# Patient Record
Sex: Female | Born: 1995 | Race: Black or African American | Hispanic: No | Marital: Married | Smoking: Never smoker
Health system: Southern US, Community
[De-identification: ages and names within clinical notes are randomized; demographics above are authoritative.]

## PROBLEM LIST (undated history)

## (undated) ENCOUNTER — Inpatient Hospital Stay (HOSPITAL_COMMUNITY): Payer: Self-pay

## (undated) ENCOUNTER — Inpatient Hospital Stay (HOSPITAL_COMMUNITY): Payer: Medicaid Other

## (undated) DIAGNOSIS — J45909 Unspecified asthma, uncomplicated: Secondary | ICD-10-CM

## (undated) DIAGNOSIS — O139 Gestational [pregnancy-induced] hypertension without significant proteinuria, unspecified trimester: Secondary | ICD-10-CM

## (undated) DIAGNOSIS — O09299 Supervision of pregnancy with other poor reproductive or obstetric history, unspecified trimester: Secondary | ICD-10-CM

## (undated) DIAGNOSIS — Z87828 Personal history of other (healed) physical injury and trauma: Secondary | ICD-10-CM

## (undated) DIAGNOSIS — D759 Disease of blood and blood-forming organs, unspecified: Secondary | ICD-10-CM

## (undated) DIAGNOSIS — O1495 Unspecified pre-eclampsia, complicating the puerperium: Secondary | ICD-10-CM

## (undated) DIAGNOSIS — D573 Sickle-cell trait: Secondary | ICD-10-CM

## (undated) DIAGNOSIS — D649 Anemia, unspecified: Secondary | ICD-10-CM

## (undated) HISTORY — DX: Supervision of pregnancy with other poor reproductive or obstetric history, unspecified trimester: O09.299

## (undated) HISTORY — DX: Personal history of other (healed) physical injury and trauma: Z87.828

## (undated) HISTORY — PX: NO PAST SURGERIES: SHX2092

## (undated) HISTORY — DX: Unspecified pre-eclampsia, complicating the puerperium: O14.95

---
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
## 2016-07-03 NOTE — L&D Delivery Note (Signed)
Patient is a 21 y.o. now G1P1 s/p NSVD at 5450w3d, who was admitted for IOL due to gHTN  Delivery Note At 1:42 PM a viable female was delivered via Vaginal, Spontaneous Delivery (Presentation:cephalic ;  OA).  APGAR: 9, 9; weight 6 lb 3 oz (2807 g).   Placenta status:intact , .  Cord: 3 vessels  with the following complications: none.    Anesthesia: epidural  Episiotomy: None Lacerations: None Suture Repair: none Est. Blood Loss (mL): 100  Mom to postpartum.  Baby to Couplet care / Skin to Skin.   Head delivered OA. No nuchal cord present. Shoulder and body delivered in usual fashion. Infant with spontaneous cry, placed on mother's abdomen, dried and bulb suctioned. Cord clamped x 2 after 1-minute delay, and cut by family member. Cord blood drawn. Placenta delivered spontaneously with gentle cord traction. Fundus firm with massage and Pitocin.   Teodoro KilKerrriann S. Carvell Hoeffner,  MD Family Medicine Resident PGY-1 05/04/17, 3:46 PM

## 2016-10-10 ENCOUNTER — Inpatient Hospital Stay (HOSPITAL_COMMUNITY)
Admission: AD | Admit: 2016-10-10 | Discharge: 2016-10-10 | Disposition: A | Discharge status: Home / Self Care | Payer: Self-pay | Source: Ambulatory Visit | Attending: Family Medicine | Admitting: Family Medicine

## 2016-10-10 ENCOUNTER — Encounter (HOSPITAL_COMMUNITY): Payer: Self-pay | Admitting: *Deleted

## 2016-10-10 ENCOUNTER — Inpatient Hospital Stay (HOSPITAL_COMMUNITY): Payer: Self-pay

## 2016-10-10 DIAGNOSIS — O209 Hemorrhage in early pregnancy, unspecified: Secondary | ICD-10-CM | POA: Insufficient documentation

## 2016-10-10 DIAGNOSIS — Z3A09 9 weeks gestation of pregnancy: Secondary | ICD-10-CM | POA: Insufficient documentation

## 2016-10-10 DIAGNOSIS — O3481 Maternal care for other abnormalities of pelvic organs, first trimester: Secondary | ICD-10-CM

## 2016-10-10 DIAGNOSIS — O3482 Maternal care for other abnormalities of pelvic organs, second trimester: Secondary | ICD-10-CM | POA: Insufficient documentation

## 2016-10-10 DIAGNOSIS — Z79899 Other long term (current) drug therapy: Secondary | ICD-10-CM | POA: Insufficient documentation

## 2016-10-10 DIAGNOSIS — N83292 Other ovarian cyst, left side: Secondary | ICD-10-CM | POA: Insufficient documentation

## 2016-10-10 DIAGNOSIS — N83209 Unspecified ovarian cyst, unspecified side: Secondary | ICD-10-CM

## 2016-10-10 DIAGNOSIS — Z3491 Encounter for supervision of normal pregnancy, unspecified, first trimester: Secondary | ICD-10-CM

## 2016-10-10 HISTORY — DX: Unspecified asthma, uncomplicated: J45.909

## 2016-10-10 LAB — WET PREP, GENITAL
SPERM: NONE SEEN
TRICH WET PREP: NONE SEEN
Yeast Wet Prep HPF POC: NONE SEEN

## 2016-10-10 LAB — URINALYSIS, ROUTINE W REFLEX MICROSCOPIC
BILIRUBIN URINE: NEGATIVE
GLUCOSE, UA: NEGATIVE mg/dL
KETONES UR: NEGATIVE mg/dL
LEUKOCYTES UA: NEGATIVE
NITRITE: NEGATIVE
PH: 9 — AB (ref 5.0–8.0)
Protein, ur: NEGATIVE mg/dL
Specific Gravity, Urine: 1.016 (ref 1.005–1.030)

## 2016-10-10 LAB — CBC
HEMATOCRIT: 32.7 % — AB (ref 36.0–46.0)
Hemoglobin: 11.1 g/dL — ABNORMAL LOW (ref 12.0–15.0)
MCH: 29.5 pg (ref 26.0–34.0)
MCHC: 33.9 g/dL (ref 30.0–36.0)
MCV: 87 fL (ref 78.0–100.0)
Platelets: 336 10*3/uL (ref 150–400)
RBC: 3.76 MIL/uL — ABNORMAL LOW (ref 3.87–5.11)
RDW: 13.8 % (ref 11.5–15.5)
WBC: 11.5 10*3/uL — AB (ref 4.0–10.5)

## 2016-10-10 LAB — HCG, QUANTITATIVE, PREGNANCY: hCG, Beta Chain, Quant, S: 238775 m[IU]/mL — ABNORMAL HIGH (ref <5)

## 2016-10-10 LAB — POCT PREGNANCY, URINE: Preg Test, Ur: POSITIVE — AB

## 2016-10-10 LAB — ABO/RH: ABO/RH(D): O POS

## 2016-10-10 NOTE — MAU Provider Note (Signed)
Chief Complaint: No chief complaint on file.   First Provider Initiated Contact with Patient 10/10/16 1115      SUBJECTIVE HPI: Nicole Mcmahon is a 21 y.o. G1P0 at [redacted]w[redacted]d by sure LMP who presents to maternity admissions reporting onset of dark red vaginal bleeding when wiping this morning.  The bleeding has been intermittent, not seen every time she has gone to the bathroom today, and is unchanged since onset. She has not tried any treatments. There are no associated symptoms. She denies vaginal itching/burning, urinary symptoms, h/a, dizziness, n/v, or fever/chills.     HPI  Past Medical History:  Diagnosis Date  . Asthma    History reviewed. No pertinent surgical history. Social History   Social History  . Marital status: Single    Spouse name: N/A  . Number of children: N/A  . Years of education: N/A   Occupational History  . Not on file.   Social History Main Topics  . Smoking status: Never Smoker  . Smokeless tobacco: Never Used  . Alcohol use No  . Drug use: No  . Sexual activity: No   Other Topics Concern  . Not on file   Social History Narrative  . No narrative on file   No current facility-administered medications on file prior to encounter.    Current Outpatient Prescriptions on File Prior to Encounter  Medication Sig Dispense Refill  . polyethylene glycol (MIRALAX / GLYCOLAX) packet Take 17 g by mouth daily. 72 each 1   No Known Allergies  ROS:  Review of Systems  Constitutional: Negative for chills, fatigue and fever.  Respiratory: Negative for shortness of breath.   Cardiovascular: Negative for chest pain.  Gastrointestinal: Negative for nausea and vomiting.  Genitourinary: Positive for vaginal bleeding. Negative for difficulty urinating, dysuria, flank pain, pelvic pain, vaginal discharge and vaginal pain.  Neurological: Negative for dizziness and headaches.  Psychiatric/Behavioral: Negative.      I have reviewed patient's Past Medical Hx,  Surgical Hx, Family Hx, Social Hx, medications and allergies.   Physical Exam  Patient Vitals for the past 24 hrs:  BP Temp Temp src Pulse Resp Height Weight  10/10/16 1031 (!) 121/59 98.5 F (36.9 C) Oral 75 16  (1.549 m) 134 lb 1.9 oz (60.8 kg)   Constitutional: Well-developed, well-nourished female in no acute distress.  Cardiovascular: normal rate Respiratory: normal effort GI: Abd soft, non-tender. Pos BS x 4 MS: Extremities nontender, no edema, normal ROM Neurologic: Alert and oriented x 4.  GU: Neg CVAT.  PELVIC EXAM: Cervix pink, visually closed, without lesion, small amount dark red bleeding, vaginal walls and external genitalia normal Bimanual exam: Cervix 0/long/high, firm, anterior, neg CMT, uterus nontender, ~ 9 week size, adnexa without tenderness, enlargement, or mass   LAB RESULTS Results for orders placed or performed during the hospital encounter of 10/10/16 (from the past 24 hour(s))  Urinalysis, Routine w reflex microscopic     Status: Abnormal   Collection Time: 10/10/16 10:26 AM  Result Value Ref Range   Color, Urine YELLOW YELLOW   APPearance CLEAR CLEAR   Specific Gravity, Urine 1.016 1.005 - 1.030   pH 9.0 (H) 5.0 - 8.0   Glucose, UA NEGATIVE NEGATIVE mg/dL   Hgb urine dipstick SMALL (A) NEGATIVE   Bilirubin Urine NEGATIVE NEGATIVE   Ketones, ur NEGATIVE NEGATIVE mg/dL   Protein, ur NEGATIVE NEGATIVE mg/dL   Nitrite NEGATIVE NEGATIVE   Leukocytes, UA NEGATIVE NEGATIVE   RBC / HPF 0-5 0 -  5 RBC/hpf   WBC, UA 0-5 0 - 5 WBC/hpf   Bacteria, UA RARE (A) NONE SEEN   Squamous Epithelial / LPF 0-5 (A) NONE SEEN   Mucous PRESENT   Pregnancy, urine POC     Status: Abnormal   Collection Time: 10/10/16 10:58 AM  Result Value Ref Range   Preg Test, Ur POSITIVE (A) NEGATIVE  CBC     Status: Abnormal   Collection Time: 10/10/16 11:15 AM  Result Value Ref Range   WBC 11.5 (H) 4.0 - 10.5 K/uL   RBC 3.76 (L) 3.87 - 5.11 MIL/uL   Hemoglobin 11.1 (L) 12.0 -  15.0 g/dL   HCT 16.1 (L) 09.6 - 04.5 %   MCV 87.0 78.0 - 100.0 fL   MCH 29.5 26.0 - 34.0 pg   MCHC 33.9 30.0 - 36.0 g/dL   RDW 40.9 81.1 - 91.4 %   Platelets 336 150 - 400 K/uL  hCG, quantitative, pregnancy     Status: Abnormal   Collection Time: 10/10/16 11:15 AM  Result Value Ref Range   hCG, Beta Chain, Sharene Butters, S 782,956 (H) <5 mIU/mL  ABO/Rh     Status: None   Collection Time: 10/10/16 11:15 AM  Result Value Ref Range   ABO/RH(D) O POS   Wet prep, genital     Status: Abnormal   Collection Time: 10/10/16 11:20 AM  Result Value Ref Range   Yeast Wet Prep HPF POC NONE SEEN NONE SEEN   Trich, Wet Prep NONE SEEN NONE SEEN   Clue Cells Wet Prep HPF POC PRESENT (A) NONE SEEN   WBC, Wet Prep HPF POC MODERATE (A) NONE SEEN   Sperm NONE SEEN     --/--/O POS (04/10 1115)  IMAGING US Ob Comp Less 14 Wks  Result Date: 10/10/2016 CLINICAL DATA:  Lower abdominal cramping, pregnant EXAM: OBSTETRIC <14 WK [place] AND TRANSVAGINAL OB US TECHNIQUE: Both transabdominal and transvaginal ultrasound examinations were performed for complete evaluation of the gestation as well as the maternal uterus, adnexal regions, and pelvic cul-de-sac. Transvaginal technique was performed to assess early pregnancy. COMPARISON:  None. FINDINGS: Intrauterine gestational sac: Single Yolk sac:  Visualized. Embryo:  Visualized. Cardiac Activity: Visualized. Heart Rate: 168  bpm CRL:  22.5  mm   8 w   6 d                  [place] EDC: 05/16/2017 Subchorionic hemorrhage:  None visualized. Maternal uterus/adnexae: Right ovaries within normal limits. 2.8 x 3.2 x 3.0 cm complex left ovarian cyst. Small volume pelvic ascites, likely physiologic. IMPRESSION: Single live intrauterine gestation, with estimated gestational age [redacted] weeks 6 days by crown-rump length. Electronically Signed   By: Charline Bills M.D.   On: 10/10/2016 12:18   US Ob Transvaginal  Result Date: 10/10/2016 CLINICAL DATA:  Lower abdominal cramping, pregnant EXAM:  OBSTETRIC <14 WK [place] AND TRANSVAGINAL OB US TECHNIQUE: Both transabdominal and transvaginal ultrasound examinations were performed for complete evaluation of the gestation as well as the maternal uterus, adnexal regions, and pelvic cul-de-sac. Transvaginal technique was performed to assess early pregnancy. COMPARISON:  None. FINDINGS: Intrauterine gestational sac: Single Yolk sac:  Visualized. Embryo:  Visualized. Cardiac Activity: Visualized. Heart Rate: 168  bpm CRL:  22.5  mm   8 w   6 d                  [place] EDC: 05/16/2017 Subchorionic hemorrhage:  None visualized. Maternal uterus/adnexae: Right ovaries within  normal limits. 2.8 x 3.2 x 3.0 cm complex left ovarian cyst. Small volume pelvic ascites, likely physiologic. IMPRESSION: Single live intrauterine gestation, with estimated gestational age [redacted] weeks 6 days by crown-rump length. Electronically Signed   By: Charline Bills M.D.   On: 10/10/2016 12:18    MAU Management/MDM: Ordered CBC, quant hcg, U/A, wet prep, GC and [place] and reviewed results.  US shows viable IUP with no subchorionic hemorrhage.  There is a complex left ovarian cyst but pt denies pain so this may be incidental.  Reviewed bleeding precautions with pt, recommend pelvic rest while bleeding.  No discharge, odor, or pain so will not treat clue cells with no other symptoms.  [facility] to f[facility] at [facility] for [facility], message sent to establish care. Pt stable at time of discharge.  ASSESSMENT 1. Normal IUP (intrauterine pregnancy) on prenatal ultrasound, first trimester   2. Vaginal bleeding in pregnancy, first trimester   3. Complex cyst of left ovary     PLAN Discharge home with bleeding precautions  Allergies as of 10/10/2016   No Known Allergies     Medication List    TAKE these medications   polyethylene glycol packet Commonly known as:  MIRALAX / GLYCOLAX Take 17 g by mouth daily.      Follow-up Information    [facility] Follow up.    Specialty:  Obstetrics and Gynecology Why:  The office will call you with an appointment, return to MAU as needed for emergencies. Contact information: [address] [place] [address] [phone]          Sharen Counter Certified Nurse-Midwife 10/10/2016  1:16 PM

## 2016-10-10 NOTE — MAU Note (Signed)
Patient c/o vaginal bleeding that she noted started this am; not currently needing to wear a pad. Notes it was red in color; no clots. Endorses lower abdominal cramping that started on her way over to [place]. Rating pain a 4 currently. LMP 08/08/16. Positive pregnancy test at a local clinic per patient.

## 2016-10-11 LAB — GC/CHLAMYDIA PROBE AMP (~~LOC~~) NOT AT ARMC
Chlamydia: NEGATIVE
Neisseria Gonorrhea: NEGATIVE

## 2016-10-11 LAB — HIV ANTIBODY (ROUTINE TESTING W REFLEX): HIV Screen 4th Generation wRfx: NONREACTIVE

## 2016-10-31 ENCOUNTER — Encounter: Payer: Self-pay | Admitting: Obstetrics & Gynecology

## 2017-01-17 ENCOUNTER — Encounter: Payer: Self-pay | Admitting: Radiology

## 2017-01-17 ENCOUNTER — Encounter: Payer: Self-pay | Admitting: Advanced Practice Midwife

## 2017-01-17 ENCOUNTER — Ambulatory Visit (INDEPENDENT_AMBULATORY_CARE_PROVIDER_SITE_OTHER): Payer: Medicaid Other | Admitting: Advanced Practice Midwife

## 2017-01-17 ENCOUNTER — Other Ambulatory Visit (HOSPITAL_COMMUNITY)
Admission: RE | Admit: 2017-01-17 | Discharge: 2017-01-17 | Disposition: A | Discharge status: Home / Self Care | Payer: Medicaid Other | Source: Ambulatory Visit | Attending: Advanced Practice Midwife | Admitting: Advanced Practice Midwife

## 2017-01-17 VITALS — BP 127/72 | HR 102 | Wt 148.6 lb

## 2017-01-17 DIAGNOSIS — Z3A23 23 weeks gestation of pregnancy: Secondary | ICD-10-CM

## 2017-01-17 DIAGNOSIS — O0932 Supervision of pregnancy with insufficient antenatal care, second trimester: Secondary | ICD-10-CM | POA: Diagnosis not present

## 2017-01-17 DIAGNOSIS — Z363 Encounter for antenatal screening for malformations: Secondary | ICD-10-CM | POA: Insufficient documentation

## 2017-01-17 DIAGNOSIS — Z3400 Encounter for supervision of normal first pregnancy, unspecified trimester: Secondary | ICD-10-CM

## 2017-01-17 DIAGNOSIS — Z3402 Encounter for supervision of normal first pregnancy, second trimester: Secondary | ICD-10-CM

## 2017-01-17 DIAGNOSIS — Z3480 Encounter for supervision of other normal pregnancy, unspecified trimester: Secondary | ICD-10-CM | POA: Insufficient documentation

## 2017-01-17 DIAGNOSIS — Z87828 Personal history of other (healed) physical injury and trauma: Secondary | ICD-10-CM

## 2017-01-17 MED ORDER — CITRANATAL 90 DHA 90-1 & 300 MG PO MISC: 1.0000 | Freq: Every day | ORAL | 12 refills | Status: DC
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
## 2017-01-17 NOTE — Patient Instructions (Addendum)
Prenatal Care WHAT IS PRENATAL CARE? Prenatal care is the process of caring for a pregnant woman before she gives birth. Prenatal care makes sure that she and her baby remain as healthy as possible throughout pregnancy. Prenatal care may be provided by a midwife, family practice health care provider, or a childbirth and pregnancy specialist (obstetrician). Prenatal care may include physical examinations, testing, treatments, and education on nutrition, lifestyle, and social support services. WHY IS PRENATAL CARE SO IMPORTANT? Early and consistent prenatal care increases the chance that you and your baby will remain healthy throughout your pregnancy. This type of care also decreases a baby's risk of being born too early (prematurely), or being born smaller than expected (small for gestational age). Any underlying medical conditions you may have that could pose a risk during your pregnancy are discussed during prenatal care visits. You will also be monitored regularly for any new conditions that may arise during your pregnancy so they can be treated quickly and effectively. WHAT HAPPENS DURING PRENATAL CARE VISITS? Prenatal care visits may include the following: Discussion Tell your health care provider about any new signs or symptoms you have experienced since your last visit. These might include:  Nausea or vomiting.  Increased or decreased level of energy.  Difficulty sleeping.  Back or leg pain.  Weight changes.  Frequent urination.  Shortness of breath with physical activity.  Changes in your skin, such as the development of a rash or itchiness.  Vaginal discharge or bleeding.  Feelings of excitement or nervousness.  Changes in your baby's movements.  You may want to write down any questions or topics you want to discuss with your health care provider and bring them with you to your appointment. Examination During your first prenatal care visit, you will likely have a complete  physical exam. Your health care provider will often examine your vagina, cervix, and the position of your uterus, as well as check your heart, lungs, and other body systems. As your pregnancy progresses, your health care provider will measure the size of your uterus and your baby's position inside your uterus. He or she may also examine you for early signs of labor. Your prenatal visits may also include checking your blood pressure and, after about 10-12 weeks of pregnancy, listening to your baby's heartbeat. Testing Regular testing often includes:  Urinalysis. This checks your urine for glucose, protein, or signs of infection.  Blood count. This checks the levels of white and red blood cells in your body.  Tests for sexually transmitted infections (STIs). Testing for STIs at the beginning of pregnancy is routinely done and is required in many states.  Antibody testing. You will be checked to see if you are immune to certain illnesses, such as rubella, that can affect a developing fetus.  Glucose screen. Around 24-28 weeks of pregnancy, your blood glucose level will be checked for signs of gestational diabetes. Follow-up tests may be recommended.  Group B strep. This is a bacteria that is commonly found inside a woman's vagina. This test will inform your health care provider if you need an antibiotic to reduce the amount of this bacteria in your body prior to labor and childbirth.  Ultrasound. Many pregnant women undergo an ultrasound screening around 18-20 weeks of pregnancy to evaluate the health of the fetus and check for any developmental abnormalities.  HIV (human immunodeficiency virus) testing. Early in your pregnancy, you will be screened for HIV. If you are at high risk for HIV, this test may   be repeated during your third trimester of pregnancy.  You may be offered other testing based on your age, personal or family medical history, or other factors. HOW OFTEN SHOULD I PLAN TO SEE MY  HEALTH CARE PROVIDER FOR PRENATAL CARE? Your prenatal care check-up schedule depends on any medical conditions you have before, or develop during, your pregnancy. If you do not have any underlying medical conditions, you will likely be seen for checkups:  Monthly, during the first 6 months of pregnancy.  Twice a month during months 7 and 8 of pregnancy.  Weekly starting in the 9th month of pregnancy and until delivery.  If you develop signs of early labor or other concerning signs or symptoms, you may need to see your health care provider more often. Ask your health care provider what prenatal care schedule is best for you. WHAT CAN I DO TO KEEP MYSELF AND MY BABY AS HEALTHY AS POSSIBLE DURING MY PREGNANCY?  Take a prenatal vitamin containing 400 micrograms (0.4 mg) of folic acid every day. Your health care provider may also ask you to take additional vitamins such as iodine, vitamin D, iron, copper, and zinc.  Take 1500-2000 mg of calcium daily starting at your 20th week of pregnancy until you deliver your baby.  Make sure you are up to date on your vaccinations. Unless directed otherwise by your health care provider: ? You should receive a tetanus, diphtheria, and pertussis (Tdap) vaccination between the 27th and 36th week of your pregnancy, regardless of when your last Tdap immunization occurred. This helps protect your baby from whooping cough (pertussis) after he or she is born. ? You should receive an annual inactivated influenza vaccine (IIV) to help protect you and your baby from influenza. This can be done at any point during your pregnancy.  Eat a well-rounded diet that includes: ? Fresh fruits and vegetables. ? Lean proteins. ? Calcium-rich foods such as milk, yogurt, hard cheeses, and dark, leafy greens. ? Whole grain breads.  Do noteat seafood high in mercury, including: ? Swordfish. ? Tilefish. ? Shark. ? King mackerel. ? More than 6 oz tuna per week.  Do not  eat: ? Raw or undercooked meats or eggs. ? Unpasteurized foods, such as soft cheeses (brie, blue, or feta), juices, and milks. ? Lunch meats. ? Hot dogs that have not been heated until they are steaming.  Drink enough water to keep your urine clear or pale yellow. For many women, this may be 10 or more 8 oz glasses of water each day. Keeping yourself hydrated helps deliver nutrients to your baby and may prevent the start of pre-term uterine contractions.  Do not use any tobacco products including cigarettes, chewing tobacco, or electronic cigarettes. If you need help quitting, ask your health care provider.  Do not drink beverages containing alcohol. No safe level of alcohol consumption during pregnancy has been determined.  Do not use any illegal drugs. These can harm your developing baby or cause a miscarriage.  Ask your health care provider or pharmacist before taking any prescription or over-the-counter medicines, herbs, or supplements.  Limit your caffeine intake to no more than 200 mg per day.  Exercise. Unless told otherwise by your health care provider, try to get 30 minutes of moderate exercise most days of the week. Do not  do high-impact activities, contact sports, or activities with a high risk of falling, such as horseback riding or downhill skiing.  Get plenty of rest.  Avoid anything that raises your  body temperature, such as hot tubs and saunas.  If you own a cat, do not empty its litter box. Bacteria contained in cat feces can cause an infection called toxoplasmosis. This can result in serious harm to the fetus.  Stay away from chemicals such as insecticides, lead, mercury, and cleaning or paint products that contain solvents.  Do not have any X-rays taken unless medically necessary.  Take a childbirth and breastfeeding preparation class. Ask your health care provider if you need a referral or recommendation.  This information is not intended to replace advice given  to you by your health care provider. Make sure you discuss any questions you have with your health care provider. Document Released: 06/22/2003 Document Revised: 11/22/2015 Document Reviewed: 09/03/2013 Elsevier Interactive Patient Education  2017 [employer].   Preterm Labor and Birth Information The normal length of a pregnancy is 39-41 weeks. Preterm labor is when labor starts before 37 completed weeks of pregnancy. What are the risk factors for preterm labor? Preterm labor is more likely to occur in women who:  Have certain infections during pregnancy such as a bladder infection, sexually transmitted infection, or infection inside the uterus (chorioamnionitis).  Have a shorter-than-normal cervix.  Have gone into preterm labor before.  Have had surgery on their cervix.  Are younger than age 44 or older than age 23.  Are African American.  Are pregnant with twins or multiple babies (multiple gestation).  Take street drugs or smoke while pregnant.  Do not gain enough weight while pregnant.  Became pregnant shortly after having been pregnant.  What are the symptoms of preterm labor? Symptoms of preterm labor include:  Cramps similar to those that can happen during a menstrual period. The cramps may happen with diarrhea.  Pain in the abdomen or lower back.  Regular uterine contractions that may feel like tightening of the abdomen.  A feeling of increased pressure in the pelvis.  Increased watery or bloody mucus discharge from the vagina.  Water breaking (ruptured amniotic sac).  Why is it important to recognize signs of preterm labor? It is important to recognize signs of preterm labor because babies who are born prematurely may not be fully developed. This can put them at an increased risk for:  Long-term (chronic) heart and lung problems.  Difficulty immediately after birth with regulating body systems, including blood sugar, body temperature, heart rate, and  breathing rate.  Bleeding in the brain.  Cerebral palsy.  Learning difficulties.  Death.  These risks are highest for babies who are born before 34 weeks of pregnancy. How is preterm labor treated? Treatment depends on the length of your pregnancy, your condition, and the health of your baby. It may involve:  Having a stitch (suture) placed in your cervix to prevent your cervix from opening too early (cerclage).  Taking or being given medicines, such as: ? Hormone medicines. These may be given early in pregnancy to help support the pregnancy. ? Medicine to stop contractions. ? Medicines to help mature the baby's lungs. These may be prescribed if the risk of delivery is high. ? Medicines to prevent your baby from developing cerebral palsy.  If the labor happens before 34 weeks of pregnancy, you may need to stay in the hospital. What should I do if I think I am in preterm labor? If you think that you are going into preterm labor, call your health care provider right away. How can I prevent preterm labor in future pregnancies? To increase your chance  of having a full-term pregnancy:  Do not use any tobacco products, such as cigarettes, chewing tobacco, and e-cigarettes. If you need help quitting, ask your health care provider.  Do not use street drugs or medicines that have not been prescribed to you during your pregnancy.  Talk with your health care provider before taking any herbal supplements, even if you have been taking them regularly.  Make sure you gain a healthy amount of weight during your pregnancy.  Watch for infection. If you think that you might have an infection, get it checked right away.  Make sure to tell your health care provider if you have gone into preterm labor before.  This information is not intended to replace advice given to you by your health care provider. Make sure you discuss any questions you have with your health care provider. Document Released:  09/09/2003 Document Revised: 11/30/2015 Document Reviewed: 11/10/2015 Elsevier Interactive Patient Education  2018 [employer]  AREA PEDIATRIC/FAMILY PRACTICE PHYSICIANS  [facility] [address] [place], [place]  [address] Phone - [phone]   Fax - [phone]  [facility] [address] [place], [place] [address] Phone - [phone]   Fax - [phone]  JACK AMOS [address] [place], [place]  [address] Phone - [phone]   Fax - [phone]  [facility] [address] [place], [place]  [address] Phone - [phone]   Fax - [phone]  [facility] [address] [place], [place]  [address] Phone - [phone]   Fax - [phone]  [facility] [address], [address] [place], [place]  [address] Phone - [phone]   Fax - [phone]  [facility] [address], [address] [place], [place]  [address] Phone - [phone]   Fax - [phone]  [facility] [address] [place], [place]  [address] Phone - [phone]   Fax - [phone]  [facility] [address] [place] [address] [place], [place]  [address] Phone - [phone]   Fax - [phone] [facility] [address] [place], [place]  [address] Phone - [phone]   Fax - [phone]  [facility] [address] [place], [place]  [address] Phone - [phone]   Fax - [phone]  [facility] [address], [address] [place], [place]  [address] Phone - [phone]   Fax - [phone]  [facility] [address] [place], [place]  [address] Phone - [phone]   Fax - [phone]  [facility] [address], [address] [place], [place]  [address] Phone - [phone]  [facility] [address] [place], [place]  [address] Phone - [phone]   Fax - [phone]  [facility] [address], [address] [place], [place]  [address] Phone - [phone]   Fax - [phone]  [facility] [address] [place], [place]  [address] Phone - [phone]   Fax - [phone]  [facility] [address] [place], [place]  [address] Phone - [phone]   Fax - [phone]   [facility] [address] [place], [place]  [address] Phone - [phone]   Fax - [phone]  [facility] [address], [address] [place], [place]  [address] Phone - [phone]   Fax - [phone]  DAVID RUBIN [address] [place], [place]  [address] Phone - [phone]   Fax - [phone]  [facility] [address] [place], [place]  [address] Phone - [phone]   Fax - [phone]  [facility] Al[facility] Chyle [address] [place], [place]  [address] Phone - [phone]   Fax - [phone] Gerarda Fraction [address] [place], [place]  [address] Phone - [phone]   Fax - [phone]  [facility] [address] [place], [place]  [address] Phone - [phone]   Fax - [phone]  [facility] [place] [address] [place], [place]  [address] Phone - [phone]   Fax - [phone]  [facility] [place] Wyvonne Lenz MD [address] [place] [address] Phone [phone]  Fax [phone]

## 2017-01-18 ENCOUNTER — Encounter: Payer: Self-pay | Admitting: *Deleted

## 2017-01-18 LAB — CERVICOVAGINAL ANCILLARY ONLY
BACTERIAL VAGINITIS: NEGATIVE
CHLAMYDIA, DNA PROBE: NEGATIVE
Candida vaginitis: NEGATIVE
NEISSERIA GONORRHEA: NEGATIVE
TRICH (WINDOWPATH): NEGATIVE

## 2017-01-19 LAB — OBSTETRIC PANEL, INCLUDING HIV
ANTIBODY SCREEN: NEGATIVE
BASOS: 0 %
Basophils Absolute: 0 10*3/uL (ref 0.0–0.2)
EOS (ABSOLUTE): 0.4 10*3/uL (ref 0.0–0.4)
EOS: 3 %
HEMATOCRIT: 30.5 % — AB (ref 34.0–46.6)
HEMOGLOBIN: 9.7 g/dL — AB (ref 11.1–15.9)
HIV SCREEN 4TH GENERATION: NONREACTIVE
Hepatitis B Surface Ag: NEGATIVE
Immature Grans (Abs): 0 10*3/uL (ref 0.0–0.1)
Immature Granulocytes: 0 %
Lymphocytes Absolute: 2.5 10*3/uL (ref 0.7–3.1)
Lymphs: 15 %
MCH: 29.9 pg (ref 26.6–33.0)
MCHC: 31.8 g/dL (ref 31.5–35.7)
MCV: 94 fL (ref 79–97)
MONOS ABS: 0.7 10*3/uL (ref 0.1–0.9)
Monocytes: 4 %
NEUTROS ABS: 13.4 10*3/uL — AB (ref 1.4–7.0)
Neutrophils: 78 %
Platelets: 326 10*3/uL (ref 150–379)
RBC: 3.24 x10E6/uL — AB (ref 3.77–5.28)
RDW: 13 % (ref 12.3–15.4)
RH TYPE: POSITIVE
RPR Ser Ql: NONREACTIVE
Rubella Antibodies, IGG: <0.9 index — ABNORMAL LOW (ref >0.99)
WBC: 17.1 10*3/uL — ABNORMAL HIGH (ref 3.4–10.8)

## 2017-01-19 LAB — HEMOGLOBINOPATHY EVALUATION
HEMOGLOBIN F QUANTITATION: 0 % (ref 0.0–2.0)
HGB A: 97.7 % (ref 96.4–98.8)
HGB C: 0 %
HGB S: 0 %
HGB VARIANT: 0 %
Hemoglobin A2 Quantitation: 2.3 % (ref 1.8–3.2)

## 2017-01-19 LAB — URINE CULTURE, OB REFLEX

## 2017-01-19 LAB — VARICELLA ZOSTER ANTIBODY, IGG

## 2017-01-19 LAB — CULTURE, OB URINE

## 2017-01-22 ENCOUNTER — Telehealth: Payer: Self-pay | Admitting: *Deleted

## 2017-01-22 ENCOUNTER — Encounter (HOSPITAL_COMMUNITY): Payer: Self-pay | Admitting: Advanced Practice Midwife

## 2017-01-22 DIAGNOSIS — Z283 Underimmunization status: Secondary | ICD-10-CM | POA: Insufficient documentation

## 2017-01-22 DIAGNOSIS — O99019 Anemia complicating pregnancy, unspecified trimester: Secondary | ICD-10-CM

## 2017-01-22 DIAGNOSIS — O09899 Supervision of other high risk pregnancies, unspecified trimester: Secondary | ICD-10-CM | POA: Insufficient documentation

## 2017-01-22 DIAGNOSIS — Z2839 Other underimmunization status: Secondary | ICD-10-CM | POA: Insufficient documentation

## 2017-01-22 DIAGNOSIS — O9989 Other specified diseases and conditions complicating pregnancy, childbirth and the puerperium: Secondary | ICD-10-CM

## 2017-01-22 MED ORDER — FERROUS SULFATE 325 (65 FE) MG PO TABS: 325.0000 mg | ORAL_TABLET | Freq: Two times a day (BID) | ORAL | 1 refills | Status: DC

## 2017-01-22 NOTE — Telephone Encounter (Signed)
Called pt to go over lab results. Pt states she would like Rx for Iron v/s buying OTC. Sent Rx to preferred pharm.

## 2017-01-22 NOTE — Telephone Encounter (Signed)
-----   Message from [place], [place] sent at 01/22/2017  1:00 PM EDT ----- Please inform pt of anemia. Start OTC FeSO4 QD and increase dietary iron. Also Rubella and varicella non-immune. Recommend immunizations postpartum.

## 2017-01-24 ENCOUNTER — Ambulatory Visit (HOSPITAL_COMMUNITY)
Admission: RE | Admit: 2017-01-24 | Discharge: 2017-01-24 | Disposition: A | Discharge status: Home / Self Care | Payer: Medicaid Other | Source: Ambulatory Visit | Attending: Advanced Practice Midwife | Admitting: Advanced Practice Midwife

## 2017-01-24 DIAGNOSIS — Z3400 Encounter for supervision of normal first pregnancy, unspecified trimester: Secondary | ICD-10-CM

## 2017-01-24 DIAGNOSIS — Z3A23 23 weeks gestation of pregnancy: Secondary | ICD-10-CM

## 2017-01-24 DIAGNOSIS — O0932 Supervision of pregnancy with insufficient antenatal care, second trimester: Secondary | ICD-10-CM

## 2017-01-24 DIAGNOSIS — Z3A24 24 weeks gestation of pregnancy: Secondary | ICD-10-CM | POA: Diagnosis not present

## 2017-01-24 DIAGNOSIS — Z3689 Encounter for other specified antenatal screening: Secondary | ICD-10-CM | POA: Insufficient documentation

## 2017-01-24 DIAGNOSIS — Z363 Encounter for antenatal screening for malformations: Secondary | ICD-10-CM

## 2017-02-15 ENCOUNTER — Encounter: Payer: Medicaid Other | Admitting: Obstetrics and Gynecology

## 2017-02-16 ENCOUNTER — Encounter: Payer: Self-pay | Admitting: Obstetrics and Gynecology

## 2017-02-16 NOTE — Progress Notes (Addendum)
Patient did not keep OB appointment for 02/15/2017.  Cornelia Copa MD Attending Center for [employer] Mid[facility])

## 2017-02-23 ENCOUNTER — Ambulatory Visit (INDEPENDENT_AMBULATORY_CARE_PROVIDER_SITE_OTHER): Payer: Medicaid Other | Admitting: Obstetrics and Gynecology

## 2017-02-23 VITALS — BP 102/67 | HR 106 | Wt 159.0 lb

## 2017-02-23 DIAGNOSIS — Z23 Encounter for immunization: Secondary | ICD-10-CM

## 2017-02-23 DIAGNOSIS — Z3403 Encounter for supervision of normal first pregnancy, third trimester: Secondary | ICD-10-CM

## 2017-02-23 DIAGNOSIS — Z3400 Encounter for supervision of normal first pregnancy, unspecified trimester: Secondary | ICD-10-CM

## 2017-02-23 NOTE — Progress Notes (Signed)
Prenatal Visit Note Date: 02/23/2017 Clinic: [facility]  Subjective:  Nicole Mcmahon is a 21 y.o. G1P0 at [redacted]w[redacted]d being seen today for ongoing prenatal care.  She is currently monitored for the following issues for this low-risk pregnancy and has Supervision of normal first pregnancy, antepartum; H/O genital injury; Rubella non-immune status, antepartum; Maternal varicella, non-immune; and Anemia affecting pregnancy, antepartum on her problem list.  Patient reports no complaints.   Contractions: Not present. Vag. Bleeding: None.  Movement: Present. Denies leaking of fluid.   The following portions of the patient's history were reviewed and updated as appropriate: allergies, current medications, past family history, past medical history, past social history, past surgical history and problem list. Problem list updated.  Objective:   Vitals:   02/23/17 0858  BP: 102/67  Pulse: (!) 106  Weight: 159 lb (72.1 kg)    Fetal Status: Fetal Heart Rate (bpm): 150 Fundal Height: 30 cm Movement: Present     General:  Alert, oriented and cooperative. Patient is in no acute distress.  Skin: Skin is warm and dry. No rash noted.   Cardiovascular: Normal heart rate noted  Respiratory: Normal respiratory effort, no problems with respiration noted  Abdomen: Soft, gravid, appropriate for gestational age. Pain/Pressure: Present     Pelvic:  Cervical exam deferred        Extremities: Normal range of motion.  Edema: None  Mental Status: Normal mood and affect. Normal behavior. Normal judgment and thought content.   Urinalysis:      Assessment and Plan:  Pregnancy: G1P0 at [redacted]w[redacted]d  1. Supervision of normal first pregnancy, antepartum Routine care. Asked pt if could do exam to see about anatomy. Pt states she was never told there was any future issues. Pt declines exam b/c she'd like to have a female provider.  - CBC - Glucose Tolerance, 2 Hours w/1 Hour - HIV antibody -  RPR  Preterm labor symptoms and general obstetric precautions including but not limited to vaginal bleeding, contractions, leaking of fluid and fetal movement were reviewed in detail with the patient. Please refer to After Visit Summary for other counseling recommendations.  Return in about 2 weeks (around 03/09/2017).   [place] Bing, MD

## 2017-02-23 NOTE — Progress Notes (Signed)
.  rout 

## 2017-02-24 LAB — CBC
HEMATOCRIT: 34.3 % (ref 34.0–46.6)
HEMOGLOBIN: 11 g/dL — AB (ref 11.1–15.9)
MCH: 30.2 pg (ref 26.6–33.0)
MCHC: 32.1 g/dL (ref 31.5–35.7)
MCV: 94 fL (ref 79–97)
Platelets: 329 10*3/uL (ref 150–379)
RBC: 3.64 x10E6/uL — ABNORMAL LOW (ref 3.77–5.28)
RDW: 13.6 % (ref 12.3–15.4)
WBC: 17.4 10*3/uL — AB (ref 3.4–10.8)

## 2017-02-24 LAB — HIV ANTIBODY (ROUTINE TESTING W REFLEX): HIV SCREEN 4TH GENERATION: NONREACTIVE

## 2017-02-24 LAB — RPR: RPR Ser Ql: NONREACTIVE

## 2017-02-24 LAB — GLUCOSE TOLERANCE, 2 HOURS W/ 1HR
GLUCOSE, FASTING: 79 mg/dL (ref 65–91)
Glucose, 1 hour: 94 mg/dL (ref 65–179)
Glucose, 2 hour: 83 mg/dL (ref 65–152)

## 2017-03-07 ENCOUNTER — Ambulatory Visit (INDEPENDENT_AMBULATORY_CARE_PROVIDER_SITE_OTHER): Payer: Medicaid Other | Admitting: Family Medicine

## 2017-03-07 VITALS — BP 127/85 | HR 109 | Wt 165.0 lb

## 2017-03-07 DIAGNOSIS — O99013 Anemia complicating pregnancy, third trimester: Secondary | ICD-10-CM

## 2017-03-07 DIAGNOSIS — O09899 Supervision of other high risk pregnancies, unspecified trimester: Secondary | ICD-10-CM

## 2017-03-07 DIAGNOSIS — Z283 Underimmunization status: Secondary | ICD-10-CM

## 2017-03-07 DIAGNOSIS — Z87828 Personal history of other (healed) physical injury and trauma: Secondary | ICD-10-CM

## 2017-03-07 DIAGNOSIS — O9989 Other specified diseases and conditions complicating pregnancy, childbirth and the puerperium: Secondary | ICD-10-CM

## 2017-03-07 DIAGNOSIS — O99019 Anemia complicating pregnancy, unspecified trimester: Secondary | ICD-10-CM

## 2017-03-07 DIAGNOSIS — Z3403 Encounter for supervision of normal first pregnancy, third trimester: Secondary | ICD-10-CM

## 2017-03-07 DIAGNOSIS — K219 Gastro-esophageal reflux disease without esophagitis: Secondary | ICD-10-CM

## 2017-03-07 DIAGNOSIS — Z2839 Other underimmunization status: Secondary | ICD-10-CM

## 2017-03-07 DIAGNOSIS — Z3400 Encounter for supervision of normal first pregnancy, unspecified trimester: Secondary | ICD-10-CM

## 2017-03-07 MED ORDER — RANITIDINE HCL 150 MG PO TABS: 150.0000 mg | ORAL_TABLET | Freq: Two times a day (BID) | ORAL | 11 refills | Status: DC

## 2017-03-07 NOTE — Progress Notes (Signed)
   PRENATAL VISIT NOTE  Subjective:  Nicole Mcmahon is a 21 y.o. G1P0 at 68w1dbeing seen today for ongoing prenatal care.  She is currently monitored for the following issues for this low-risk pregnancy and has Supervision of normal first pregnancy, antepartum; H/O genital injury; Rubella non-immune status, antepartum; Maternal varicella, non-immune; and Anemia affecting pregnancy, antepartum on her problem list.  Patient reports no complaints.  Contractions: Irregular. Vag. Bleeding: None.  Movement: Present. Denies leaking of fluid.   The following portions of the patient's history were reviewed and updated as appropriate: allergies, current medications, past family history, past medical history, past social history, past surgical history and problem list. Problem list updated.  Objective:   Vitals:   03/07/17 1317  BP: 127/85  Pulse: (!) 109  Weight: 165 lb (74.8 kg)    Fetal Status: Fetal Heart Rate (bpm): 145   Movement: Present     General:  Alert, oriented and cooperative. Patient is in no acute distress.  Skin: Skin is warm and dry. No rash noted.   Cardiovascular: Normal heart rate noted  Respiratory: Normal respiratory effort, no problems with respiration noted  Abdomen: Soft, gravid, appropriate for gestational age.  Pain/Pressure: Present     Pelvic: No gross abnormalities of external genetalia. No scar tissue present in vagina        Extremities: Normal range of motion.  Edema: None  Mental Status:  Normal mood and affect. Normal behavior. Normal judgment and thought content.   Assessment and Plan:  Pregnancy: G1P0 at 354w1d1. H/O genital injury Reviewed anatomy  2. Supervision of normal first pregnancy, antepartum UTD  3. Rubella non-immune status, antepartum MMR postpartum  4. Maternal varicella, non-immune Varicella PP  5. Anemia affecting pregnancy, antepartum Continue Fe  Preterm labor symptoms and general obstetric precautions including but not  limited to vaginal bleeding, contractions, leaking of fluid and fetal movement were reviewed in detail with the patient. Please refer to After Visit Summary for other counseling recommendations.  Return in about 2 weeks (around 03/21/2017) for Routine prenatal care.   KiCaren MacadamMD

## 2017-03-07 NOTE — Patient Instructions (Addendum)
Places to have your son circumcised:    [facility] [phone] $480 by 4 wks  [facility] [phone] $244 by 4 wks  [facility] [phone] $175 by 2 wks  [facility] [phone] $250 by 7 days [facility] [phone] $150 by 4 wks  These prices sometimes change but are roughly what you can expect to pay. Please call and confirm pricing.   Circumcision is considered an elective/non-medically necessary procedure. There are many reasons parents decide to have their sons circumsized. During the first year of life circumcised males have a reduced risk of urinary tract infections but after this year the rates between circumcised males and uncircumcised males are the same.  It is safe to have your son circumcised outside of the hospital and the places above perform them regularly.      Safe Medications in Pregnancy   Acne: Benzoyl Peroxide Salicylic Acid  Backache/Headache: Tylenol: 2 regular strength every 4 hours OR              2 Extra strength every 6 hours  Colds/Coughs/Allergies: Benadryl (alcohol free) 25 mg every 6 hours as needed Breath right strips Claritin Cepacol throat lozenges Chloraseptic throat spray Cold-Eeze- up to three times per day Cough drops, alcohol free Flonase (by prescription only) Guaifenesin Mucinex Robitussin DM (plain only, alcohol free) Saline nasal spray/drops Sudafed (pseudoephedrine) & Actifed ** use only after [redacted] weeks gestation and if you do not have high blood pressure Tylenol Vicks Vaporub Zinc lozenges Zyrtec   Constipation: Colace Ducolax suppositories Fleet enema Glycerin suppositories Metamucil Milk of magnesia Miralax Senokot Smooth move tea  Diarrhea: Kaopectate Imodium A-D  *NO pepto Bismol  Hemorrhoids: Anusol Anusol HC Preparation  H Tucks  Indigestion: Tums Maalox Mylanta Zantac  Pepcid  Insomnia: Benadryl (alcohol free) 25mg  every 6 hours as needed Tylenol PM Unisom, no Gelcaps  Leg Cramps: Tums MagGel  Nausea/Vomiting:  Bonine Dramamine Emetrol Ginger extract Sea bands Meclizine  Nausea medication to take during pregnancy:  Unisom (doxylamine succinate 25 mg tablets) Take one tablet daily at bedtime. If symptoms are not adequately controlled, the dose can be increased to a maximum recommended dose of two tablets daily (1/2 tablet in the morning, 1/2 tablet mid-afternoon and one at bedtime). Vitamin B6 100mg  tablets. Take one tablet twice a day (up to 200 mg per day).  Skin Rashes: Aveeno products Benadryl cream or 25mg  every 6 hours as needed Calamine Lotion 1% cortisone cream  Yeast infection: Gyne-lotrimin 7 Monistat 7   **If taking multiple medications, please check labels to avoid duplicating the same active ingredients **take medication as directed on the label ** Do not exceed 4000 mg of tylenol in 24 hours **Do not take medications that contain aspirin or ibuprofen

## 2017-03-09 ENCOUNTER — Encounter: Payer: Self-pay | Admitting: *Deleted

## 2017-03-09 DIAGNOSIS — Z3400 Encounter for supervision of normal first pregnancy, unspecified trimester: Secondary | ICD-10-CM

## 2017-03-12 ENCOUNTER — Encounter (INDEPENDENT_AMBULATORY_CARE_PROVIDER_SITE_OTHER): Payer: Medicaid Other | Admitting: *Deleted

## 2017-03-12 DIAGNOSIS — Z3483 Encounter for supervision of other normal pregnancy, third trimester: Secondary | ICD-10-CM

## 2017-03-21 ENCOUNTER — Encounter: Payer: Medicaid Other | Admitting: Obstetrics and Gynecology

## 2017-03-30 ENCOUNTER — Ambulatory Visit (INDEPENDENT_AMBULATORY_CARE_PROVIDER_SITE_OTHER): Payer: Medicaid Other | Admitting: Family Medicine

## 2017-03-30 DIAGNOSIS — Z3400 Encounter for supervision of normal first pregnancy, unspecified trimester: Secondary | ICD-10-CM

## 2017-03-30 DIAGNOSIS — Z3403 Encounter for supervision of normal first pregnancy, third trimester: Secondary | ICD-10-CM

## 2017-03-30 MED ORDER — CITRANATAL 90 DHA 90-1 & 300 MG PO MISC: 1.0000 | Freq: Every day | ORAL | 12 refills | Status: DC

## 2017-03-30 NOTE — Progress Notes (Signed)
   PRENATAL VISIT NOTE  Subjective:  Nicole Mcmahon is a 21 y.o. G1P0 at [redacted]w[redacted]d being seen today for ongoing prenatal care.  She is currently monitored for the following issues for this low-risk pregnancy and has Supervision of normal first pregnancy, antepartum; H/O genital injury; Rubella non-immune status, antepartum; Maternal varicella, non-immune; and Anemia affecting pregnancy, antepartum on her problem list.  Patient reports no complaints.  Contractions: Not present. Vag. Bleeding: None.  Movement: Present. Denies leaking of fluid.   The following portions of the patient's history were reviewed and updated as appropriate: allergies, current medications, past family history, past medical history, past social history, past surgical history and problem list. Problem list updated.  Objective:   Vitals:   03/30/17 1124  BP: 124/77  Weight: 169 lb (76.7 kg)    Fetal Status: Fetal Heart Rate (bpm): 152 Fundal Height: 33 cm Movement: Present     General:  Alert, oriented and cooperative. Patient is in no acute distress.  Skin: Skin is warm and dry. No rash noted.   Cardiovascular: Normal heart rate noted  Respiratory: Normal respiratory effort, no problems with respiration noted  Abdomen: Soft, gravid, appropriate for gestational age.  Pain/Pressure: Present     Pelvic: Cervical exam deferred        Extremities: Normal range of motion.  Edema: Trace  Mental Status:  Normal mood and affect. Normal behavior. Normal judgment and thought content.   Assessment and Plan:  Pregnancy: G1P0 at [redacted]w[redacted]d  1. Supervision of normal first pregnancy, antepartum Continue routine prenatal care.  - Prenat w/o [id number] (CITRANATAL 90 DHA) 90-1 & 300 MG MISC; Take 1 tablet by mouth daily.  Dispense: 30 each; Refill: 12  Preterm labor symptoms and general obstetric precautions including but not limited to vaginal bleeding, contractions, leaking of fluid and fetal movement were reviewed in  detail with the patient. Please refer to After Visit Summary for other counseling recommendations.  Return in 2 weeks (on 04/13/2017).   Reva Bores, MD

## 2017-03-30 NOTE — Patient Instructions (Signed)
Breastfeeding Deciding to breastfeed is one of the best choices you can make for you and your baby. A change in hormones during pregnancy causes your breast tissue to grow and increases the number and size of your milk ducts. These hormones also allow proteins, sugars, and fats from your blood supply to make breast milk in your milk-producing glands. Hormones prevent breast milk from being released before your baby is born as well as prompt milk flow after birth. Once breastfeeding has begun, thoughts of your baby, as well as his or her sucking or crying, can stimulate the release of milk from your milk-producing glands. Benefits of breastfeeding For Your Baby  Your first milk (colostrum) helps your baby's digestive system function better.  There are antibodies in your milk that help your baby fight off infections.  Your baby has a lower incidence of asthma, allergies, and sudden infant death syndrome.  The nutrients in breast milk are better for your baby than infant formulas and are designed uniquely for your baby's needs.  Breast milk improves your baby's brain development.  Your baby is less likely to develop other conditions, such as childhood obesity, asthma, or type 2 diabetes mellitus.  For You  Breastfeeding helps to create a very special bond between you and your baby.  Breastfeeding is convenient. Breast milk is always available at the correct temperature and costs nothing.  Breastfeeding helps to burn calories and helps you lose the weight gained during pregnancy.  Breastfeeding makes your uterus contract to its prepregnancy size faster and slows bleeding (lochia) after you give birth.  Breastfeeding helps to lower your risk of developing type 2 diabetes mellitus, osteoporosis, and breast or ovarian cancer later in life.  Signs that your baby is hungry Early Signs of Hunger  Increased alertness or activity.  Stretching.  Movement of the head from side to  side.  Movement of the head and opening of the mouth when the corner of the mouth or cheek is stroked (rooting).  Increased sucking sounds, smacking lips, cooing, sighing, or squeaking.  Hand-to-mouth movements.  Increased sucking of fingers or hands.  Late Signs of Hunger  Fussing.  Intermittent crying.  Extreme Signs of Hunger Signs of extreme hunger will require calming and consoling before your baby will be able to breastfeed successfully. Do not wait for the following signs of extreme hunger to occur before you initiate breastfeeding:  Restlessness.  A loud, strong cry.  Screaming.  Breastfeeding basics Breastfeeding Initiation  Find a comfortable place to sit or lie down, with your neck and back well supported.  Place a pillow or rolled up blanket under your baby to bring him or her to the level of your breast (if you are seated). Nursing pillows are specially designed to help support your arms and your baby while you breastfeed.  Make sure that your baby's abdomen is facing your abdomen.  Gently massage your breast. With your fingertips, massage from your chest wall toward your nipple in a circular motion. This encourages milk flow. You may need to continue this action during the feeding if your milk flows slowly.  Support your breast with 4 fingers underneath and your thumb above your nipple. Make sure your fingers are well away from your nipple and your baby's mouth.  Stroke your baby's lips gently with your finger or nipple.  When your baby's mouth is open wide enough, quickly bring your baby to your breast, placing your entire nipple and as much of the colored area   around your nipple (areola) as possible into your baby's mouth. ? More areola should be visible above your baby's upper lip than below the lower lip. ? Your baby's tongue should be between his or her lower gum and your breast.  Ensure that your baby's mouth is correctly positioned around your nipple  (latched). Your baby's lips should create a seal on your breast and be turned out (everted).  It is common for your baby to suck about 2-3 minutes in order to start the flow of breast milk.  Latching Teaching your baby how to latch on to your breast properly is very important. An improper latch can cause nipple pain and decreased milk supply for you and poor weight gain in your baby. Also, if your baby is not latched onto your nipple properly, he or she may swallow some air during feeding. This can make your baby fussy. Burping your baby when you switch breasts during the feeding can help to get rid of the air. However, teaching your baby to latch on properly is still the best way to prevent fussiness from swallowing air while breastfeeding. Signs that your baby has successfully latched on to your nipple:  Silent tugging or silent sucking, without causing you pain.  Swallowing heard between every 3-4 sucks.  Muscle movement above and in front of his or her ears while sucking.  Signs that your baby has not successfully latched on to nipple:  Sucking sounds or smacking sounds from your baby while breastfeeding.  Nipple pain.  If you think your baby has not latched on correctly, slip your finger into the corner of your baby's mouth to break the suction and place it between your baby's gums. Attempt breastfeeding initiation again. Signs of Successful Breastfeeding Signs from your baby:  A gradual decrease in the number of sucks or complete cessation of sucking.  Falling asleep.  Relaxation of his or her body.  Retention of a small amount of milk in his or her mouth.  Letting go of your breast by himself or herself.  Signs from you:  Breasts that have increased in firmness, weight, and size 1-3 hours after feeding.  Breasts that are softer immediately after breastfeeding.  Increased milk volume, as well as a change in milk consistency and color by the fifth day of  breastfeeding.  Nipples that are not sore, cracked, or bleeding.  Signs That Your Baby is Getting Enough Milk  Wetting at least 1-2 diapers during the first 24 hours after birth.  Wetting at least 5-6 diapers every 24 hours for the first week after birth. The urine should be clear or pale yellow by 5 days after birth.  Wetting 6-8 diapers every 24 hours as your baby continues to grow and develop.  At least 3 stools in a 24-hour period by age 5 days. The stool should be soft and yellow.  At least 3 stools in a 24-hour period by age 7 days. The stool should be seedy and yellow.  No loss of weight greater than 10% of birth weight during the first 3 days of age.  Average weight gain of 4-7 ounces (113-198 g) per week after age 4 days.  Consistent daily weight gain by age 5 days, without weight loss after the age of 2 weeks.  After a feeding, your baby may spit up a small amount. This is common. Breastfeeding frequency and duration Frequent feeding will help you make more milk and can prevent sore nipples and breast engorgement. Breastfeed when   you feel the need to reduce the fullness of your breasts or when your baby shows signs of hunger. This is called "breastfeeding on demand." Avoid introducing a pacifier to your baby while you are working to establish breastfeeding (the first 4-6 weeks after your baby is born). After this time you may choose to use a pacifier. Research has shown that pacifier use during the first year of a baby's life decreases the risk of sudden infant death syndrome (SIDS). Allow your baby to feed on each breast as long as he or she wants. Breastfeed until your baby is finished feeding. When your baby unlatches or falls asleep while feeding from the first breast, offer the second breast. Because newborns are often sleepy in the first few weeks of life, you may need to awaken your baby to get him or her to feed. Breastfeeding times will vary from baby to baby. However,  the following rules can serve as a guide to help you ensure that your baby is properly fed:  Newborns (babies 4 weeks of age or younger) may breastfeed every 1-3 hours.  Newborns should not go longer than 3 hours during the day or 5 hours during the night without breastfeeding.  You should breastfeed your baby a minimum of 8 times in a 24-hour period until you begin to introduce solid foods to your baby at around 6 months of age.  Breast milk pumping Pumping and storing breast milk allows you to ensure that your baby is exclusively fed your breast milk, even at times when you are unable to breastfeed. This is especially important if you are going back to work while you are still breastfeeding or when you are not able to be present during feedings. Your lactation consultant can give you guidelines on how long it is safe to store breast milk. A breast pump is a machine that allows you to pump milk from your breast into a sterile bottle. The pumped breast milk can then be stored in a refrigerator or freezer. Some breast pumps are operated by hand, while others use electricity. Ask your lactation consultant which type will work best for you. Breast pumps can be purchased, but some hospitals and breastfeeding support groups lease breast pumps on a monthly basis. A lactation consultant can teach you how to hand express breast milk, if you prefer not to use a pump. Caring for your breasts while you breastfeed Nipples can become dry, cracked, and sore while breastfeeding. The following recommendations can help keep your breasts moisturized and healthy:  Avoid using soap on your nipples.  Wear a supportive bra. Although not required, special nursing bras and tank tops are designed to allow access to your breasts for breastfeeding without taking off your entire bra or top. Avoid wearing underwire-style bras or extremely tight bras.  Air dry your nipples for 3-4minutes after each feeding.  Use only cotton  bra pads to absorb leaked breast milk. Leaking of breast milk between feedings is normal.  Use lanolin on your nipples after breastfeeding. Lanolin helps to maintain your skin's normal moisture barrier. If you use pure lanolin, you do not need to wash it off before feeding your baby again. Pure lanolin is not toxic to your baby. You may also hand express a few drops of breast milk and gently massage that milk into your nipples and allow the milk to air dry.  In the first few weeks after giving birth, some women experience extremely full breasts (engorgement). Engorgement can make your   breasts feel heavy, warm, and tender to the touch. Engorgement peaks within 3-5 days after you give birth. The following recommendations can help ease engorgement:  Completely empty your breasts while breastfeeding or pumping. You may want to start by applying warm, moist heat (in the shower or with warm water-soaked hand towels) just before feeding or pumping. This increases circulation and helps the milk flow. If your baby does not completely empty your breasts while breastfeeding, pump any extra milk after he or she is finished.  Wear a snug bra (nursing or regular) or tank top for 1-2 days to signal your body to slightly decrease milk production.  Apply ice packs to your breasts, unless this is too uncomfortable for you.  Make sure that your baby is latched on and positioned properly while breastfeeding.  If engorgement persists after 48 hours of following these recommendations, contact your health care provider or a lactation consultant. Overall health care recommendations while breastfeeding  Eat healthy foods. Alternate between meals and snacks, eating 3 of each per day. Because what you eat affects your breast milk, some of the foods may make your baby more irritable than usual. Avoid eating these foods if you are sure that they are negatively affecting your baby.  Drink milk, fruit juice, and water to  satisfy your thirst (about 10 glasses a day).  Rest often, relax, and continue to take your prenatal vitamins to prevent fatigue, stress, and anemia.  Continue breast self-awareness checks.  Avoid chewing and smoking tobacco. Chemicals from cigarettes that pass into breast milk and exposure to secondhand smoke may harm your baby.  Avoid alcohol and drug use, including marijuana. Some medicines that may be harmful to your baby can pass through breast milk. It is important to ask your health care provider before taking any medicine, including all over-the-counter and prescription medicine as well as vitamin and herbal supplements. It is possible to become pregnant while breastfeeding. If birth control is desired, ask your health care provider about options that will be safe for your baby. Contact a health care provider if:  You feel like you want to stop breastfeeding or have become frustrated with breastfeeding.  You have painful breasts or nipples.  Your nipples are cracked or bleeding.  Your breasts are red, tender, or warm.  You have a swollen area on either breast.  You have a fever or chills.  You have nausea or vomiting.  You have drainage other than breast milk from your nipples.  Your breasts do not become full before feedings by the fifth day after you give birth.  You feel sad and depressed.  Your baby is too sleepy to eat well.  Your baby is having trouble sleeping.  Your baby is wetting less than 3 diapers in a 24-hour period.  Your baby has less than 3 stools in a 24-hour period.  Your baby's skin or the white part of his or her eyes becomes yellow.  Your baby is not gaining weight by 5 days of age. Get help right away if:  Your baby is overly tired (lethargic) and does not want to wake up and feed.  Your baby develops an unexplained fever. This information is not intended to replace advice given to you by your health care provider. Make sure you discuss  any questions you have with your health care provider. Document Released: 06/19/2005 Document Revised: 12/01/2015 Document Reviewed: 12/11/2012 Elsevier Interactive Patient Education  2017 [employer]  

## 2017-04-13 ENCOUNTER — Encounter: Payer: Medicaid Other | Admitting: Obstetrics and Gynecology

## 2017-04-19 ENCOUNTER — Ambulatory Visit (INDEPENDENT_AMBULATORY_CARE_PROVIDER_SITE_OTHER): Payer: Medicaid Other | Admitting: Family Medicine

## 2017-04-19 ENCOUNTER — Other Ambulatory Visit (HOSPITAL_COMMUNITY)
Admission: RE | Admit: 2017-04-19 | Discharge: 2017-04-19 | Disposition: A | Discharge status: Home / Self Care | Payer: Medicaid Other | Source: Ambulatory Visit | Attending: Family Medicine | Admitting: Family Medicine

## 2017-04-19 DIAGNOSIS — O09899 Supervision of other high risk pregnancies, unspecified trimester: Secondary | ICD-10-CM

## 2017-04-19 DIAGNOSIS — Z3493 Encounter for supervision of normal pregnancy, unspecified, third trimester: Secondary | ICD-10-CM | POA: Insufficient documentation

## 2017-04-19 DIAGNOSIS — Z3A36 36 weeks gestation of pregnancy: Secondary | ICD-10-CM | POA: Diagnosis not present

## 2017-04-19 DIAGNOSIS — Z3403 Encounter for supervision of normal first pregnancy, third trimester: Secondary | ICD-10-CM

## 2017-04-19 DIAGNOSIS — O09893 Supervision of other high risk pregnancies, third trimester: Secondary | ICD-10-CM

## 2017-04-19 DIAGNOSIS — Z3400 Encounter for supervision of normal first pregnancy, unspecified trimester: Secondary | ICD-10-CM

## 2017-04-19 DIAGNOSIS — Z283 Underimmunization status: Secondary | ICD-10-CM

## 2017-04-19 DIAGNOSIS — Z2839 Other underimmunization status: Secondary | ICD-10-CM

## 2017-04-19 NOTE — Patient Instructions (Signed)
Breastfeeding Deciding to breastfeed is one of the best choices you can make for you and your baby. A change in hormones during pregnancy causes your breast tissue to grow and increases the number and size of your milk ducts. These hormones also allow proteins, sugars, and fats from your blood supply to make breast milk in your milk-producing glands. Hormones prevent breast milk from being released before your baby is born as well as prompt milk flow after birth. Once breastfeeding has begun, thoughts of your baby, as well as his or her sucking or crying, can stimulate the release of milk from your milk-producing glands. Benefits of breastfeeding For Your Baby  Your first milk (colostrum) helps your baby's digestive system function better.  There are antibodies in your milk that help your baby fight off infections.  Your baby has a lower incidence of asthma, allergies, and sudden infant death syndrome.  The nutrients in breast milk are better for your baby than infant formulas and are designed uniquely for your baby's needs.  Breast milk improves your baby's brain development.  Your baby is less likely to develop other conditions, such as childhood obesity, asthma, or type 2 diabetes mellitus.  For You  Breastfeeding helps to create a very special bond between you and your baby.  Breastfeeding is convenient. Breast milk is always available at the correct temperature and costs nothing.  Breastfeeding helps to burn calories and helps you lose the weight gained during pregnancy.  Breastfeeding makes your uterus contract to its prepregnancy size faster and slows bleeding (lochia) after you give birth.  Breastfeeding helps to lower your risk of developing type 2 diabetes mellitus, osteoporosis, and breast or ovarian cancer later in life.  Signs that your baby is hungry Early Signs of Hunger  Increased alertness or activity.  Stretching.  Movement of the head from side to  side.  Movement of the head and opening of the mouth when the corner of the mouth or cheek is stroked (rooting).  Increased sucking sounds, smacking lips, cooing, sighing, or squeaking.  Hand-to-mouth movements.  Increased sucking of fingers or hands.  Late Signs of Hunger  Fussing.  Intermittent crying.  Extreme Signs of Hunger Signs of extreme hunger will require calming and consoling before your baby will be able to breastfeed successfully. Do not wait for the following signs of extreme hunger to occur before you initiate breastfeeding:  Restlessness.  A loud, strong cry.  Screaming.  Breastfeeding basics Breastfeeding Initiation  Find a comfortable place to sit or lie down, with your neck and back well supported.  Place a pillow or rolled up blanket under your baby to bring him or her to the level of your breast (if you are seated). Nursing pillows are specially designed to help support your arms and your baby while you breastfeed.  Make sure that your baby's abdomen is facing your abdomen.  Gently massage your breast. With your fingertips, massage from your chest wall toward your nipple in a circular motion. This encourages milk flow. You may need to continue this action during the feeding if your milk flows slowly.  Support your breast with 4 fingers underneath and your thumb above your nipple. Make sure your fingers are well away from your nipple and your baby's mouth.  Stroke your baby's lips gently with your finger or nipple.  When your baby's mouth is open wide enough, quickly bring your baby to your breast, placing your entire nipple and as much of the colored area   around your nipple (areola) as possible into your baby's mouth. ? More areola should be visible above your baby's upper lip than below the lower lip. ? Your baby's tongue should be between his or her lower gum and your breast.  Ensure that your baby's mouth is correctly positioned around your nipple  (latched). Your baby's lips should create a seal on your breast and be turned out (everted).  It is common for your baby to suck about 2-3 minutes in order to start the flow of breast milk.  Latching Teaching your baby how to latch on to your breast properly is very important. An improper latch can cause nipple pain and decreased milk supply for you and poor weight gain in your baby. Also, if your baby is not latched onto your nipple properly, he or she may swallow some air during feeding. This can make your baby fussy. Burping your baby when you switch breasts during the feeding can help to get rid of the air. However, teaching your baby to latch on properly is still the best way to prevent fussiness from swallowing air while breastfeeding. Signs that your baby has successfully latched on to your nipple:  Silent tugging or silent sucking, without causing you pain.  Swallowing heard between every 3-4 sucks.  Muscle movement above and in front of his or her ears while sucking.  Signs that your baby has not successfully latched on to nipple:  Sucking sounds or smacking sounds from your baby while breastfeeding.  Nipple pain.  If you think your baby has not latched on correctly, slip your finger into the corner of your baby's mouth to break the suction and place it between your baby's gums. Attempt breastfeeding initiation again. Signs of Successful Breastfeeding Signs from your baby:  A gradual decrease in the number of sucks or complete cessation of sucking.  Falling asleep.  Relaxation of his or her body.  Retention of a small amount of milk in his or her mouth.  Letting go of your breast by himself or herself.  Signs from you:  Breasts that have increased in firmness, weight, and size 1-3 hours after feeding.  Breasts that are softer immediately after breastfeeding.  Increased milk volume, as well as a change in milk consistency and color by the fifth day of  breastfeeding.  Nipples that are not sore, cracked, or bleeding.  Signs That Your Baby is Getting Enough Milk  Wetting at least 1-2 diapers during the first 24 hours after birth.  Wetting at least 5-6 diapers every 24 hours for the first week after birth. The urine should be clear or pale yellow by 5 days after birth.  Wetting 6-8 diapers every 24 hours as your baby continues to grow and develop.  At least 3 stools in a 24-hour period by age 5 days. The stool should be soft and yellow.  At least 3 stools in a 24-hour period by age 7 days. The stool should be seedy and yellow.  No loss of weight greater than 10% of birth weight during the first 3 days of age.  Average weight gain of 4-7 ounces (113-198 g) per week after age 4 days.  Consistent daily weight gain by age 5 days, without weight loss after the age of 2 weeks.  After a feeding, your baby may spit up a small amount. This is common. Breastfeeding frequency and duration Frequent feeding will help you make more milk and can prevent sore nipples and breast engorgement. Breastfeed when   you feel the need to reduce the fullness of your breasts or when your baby shows signs of hunger. This is called "breastfeeding on demand." Avoid introducing a pacifier to your baby while you are working to establish breastfeeding (the first 4-6 weeks after your baby is born). After this time you may choose to use a pacifier. Research has shown that pacifier use during the first year of a baby's life decreases the risk of sudden infant death syndrome (SIDS). Allow your baby to feed on each breast as long as he or she wants. Breastfeed until your baby is finished feeding. When your baby unlatches or falls asleep while feeding from the first breast, offer the second breast. Because newborns are often sleepy in the first few weeks of life, you may need to awaken your baby to get him or her to feed. Breastfeeding times will vary from baby to baby. However,  the following rules can serve as a guide to help you ensure that your baby is properly fed:  Newborns (babies 4 weeks of age or younger) may breastfeed every 1-3 hours.  Newborns should not go longer than 3 hours during the day or 5 hours during the night without breastfeeding.  You should breastfeed your baby a minimum of 8 times in a 24-hour period until you begin to introduce solid foods to your baby at around 6 months of age.  Breast milk pumping Pumping and storing breast milk allows you to ensure that your baby is exclusively fed your breast milk, even at times when you are unable to breastfeed. This is especially important if you are going back to work while you are still breastfeeding or when you are not able to be present during feedings. Your lactation consultant can give you guidelines on how long it is safe to store breast milk. A breast pump is a machine that allows you to pump milk from your breast into a sterile bottle. The pumped breast milk can then be stored in a refrigerator or freezer. Some breast pumps are operated by hand, while others use electricity. Ask your lactation consultant which type will work best for you. Breast pumps can be purchased, but some hospitals and breastfeeding support groups lease breast pumps on a monthly basis. A lactation consultant can teach you how to hand express breast milk, if you prefer not to use a pump. Caring for your breasts while you breastfeed Nipples can become dry, cracked, and sore while breastfeeding. The following recommendations can help keep your breasts moisturized and healthy:  Avoid using soap on your nipples.  Wear a supportive bra. Although not required, special nursing bras and tank tops are designed to allow access to your breasts for breastfeeding without taking off your entire bra or top. Avoid wearing underwire-style bras or extremely tight bras.  Air dry your nipples for 3-4minutes after each feeding.  Use only cotton  bra pads to absorb leaked breast milk. Leaking of breast milk between feedings is normal.  Use lanolin on your nipples after breastfeeding. Lanolin helps to maintain your skin's normal moisture barrier. If you use pure lanolin, you do not need to wash it off before feeding your baby again. Pure lanolin is not toxic to your baby. You may also hand express a few drops of breast milk and gently massage that milk into your nipples and allow the milk to air dry.  In the first few weeks after giving birth, some women experience extremely full breasts (engorgement). Engorgement can make your   breasts feel heavy, warm, and tender to the touch. Engorgement peaks within 3-5 days after you give birth. The following recommendations can help ease engorgement:  Completely empty your breasts while breastfeeding or pumping. You may want to start by applying warm, moist heat (in the shower or with warm water-soaked hand towels) just before feeding or pumping. This increases circulation and helps the milk flow. If your baby does not completely empty your breasts while breastfeeding, pump any extra milk after he or she is finished.  Wear a snug bra (nursing or regular) or tank top for 1-2 days to signal your body to slightly decrease milk production.  Apply ice packs to your breasts, unless this is too uncomfortable for you.  Make sure that your baby is latched on and positioned properly while breastfeeding.  If engorgement persists after 48 hours of following these recommendations, contact your health care provider or a lactation consultant. Overall health care recommendations while breastfeeding  Eat healthy foods. Alternate between meals and snacks, eating 3 of each per day. Because what you eat affects your breast milk, some of the foods may make your baby more irritable than usual. Avoid eating these foods if you are sure that they are negatively affecting your baby.  Drink milk, fruit juice, and water to  satisfy your thirst (about 10 glasses a day).  Rest often, relax, and continue to take your prenatal vitamins to prevent fatigue, stress, and anemia.  Continue breast self-awareness checks.  Avoid chewing and smoking tobacco. Chemicals from cigarettes that pass into breast milk and exposure to secondhand smoke may harm your baby.  Avoid alcohol and drug use, including marijuana. Some medicines that may be harmful to your baby can pass through breast milk. It is important to ask your health care provider before taking any medicine, including all over-the-counter and prescription medicine as well as vitamin and herbal supplements. It is possible to become pregnant while breastfeeding. If birth control is desired, ask your health care provider about options that will be safe for your baby. Contact a health care provider if:  You feel like you want to stop breastfeeding or have become frustrated with breastfeeding.  You have painful breasts or nipples.  Your nipples are cracked or bleeding.  Your breasts are red, tender, or warm.  You have a swollen area on either breast.  You have a fever or chills.  You have nausea or vomiting.  You have drainage other than breast milk from your nipples.  Your breasts do not become full before feedings by the fifth day after you give birth.  You feel sad and depressed.  Your baby is too sleepy to eat well.  Your baby is having trouble sleeping.  Your baby is wetting less than 3 diapers in a 24-hour period.  Your baby has less than 3 stools in a 24-hour period.  Your baby's skin or the white part of his or her eyes becomes yellow.  Your baby is not gaining weight by 5 days of age. Get help right away if:  Your baby is overly tired (lethargic) and does not want to wake up and feed.  Your baby develops an unexplained fever. This information is not intended to replace advice given to you by your health care provider. Make sure you discuss  any questions you have with your health care provider. Document Released: 06/19/2005 Document Revised: 12/01/2015 Document Reviewed: 12/11/2012 Elsevier Interactive Patient Education  2017 [employer]  

## 2017-04-19 NOTE — Progress Notes (Signed)
   PRENATAL VISIT NOTE  Subjective:  Nicole Mcmahon is a 21 y.o. G1P0 at 8470w2d being seen today for ongoing prenatal care.  She is currently monitored for the following issues for this low-risk pregnancy and has Supervision of normal first pregnancy, antepartum; H/O genital injury; Rubella non-immune status, antepartum; Maternal varicella, non-immune; and Anemia affecting pregnancy, antepartum on her problem list.  Patient reports no complaints.  Contractions: Not present. Vag. Bleeding: None.  Movement: Present. Denies leaking of fluid.   The following portions of the patient's history were reviewed and updated as appropriate: allergies, current medications, past family history, past medical history, past social history, past surgical history and problem list. Problem list updated.  Objective:   Vitals:   04/19/17 1126  BP: 126/82  Pulse: (!) 108  Weight: 171 lb 6.4 oz (77.7 kg)    Fetal Status: Fetal Heart Rate (bpm): 136 Fundal Height: 33 cm Movement: Present  Presentation: Vertex  General:  Alert, oriented and cooperative. Patient is in no acute distress.  Skin: Skin is warm and dry. No rash noted.   Cardiovascular: Normal heart rate noted  Respiratory: Normal respiratory effort, no problems with respiration noted  Abdomen: Soft, gravid, appropriate for gestational age.  Pain/Pressure: Present     Pelvic: Cervical exam performed Dilation: 1 Effacement (%): 40 Station: -2  Extremities: Normal range of motion.  Edema: Trace  Mental Status:  Normal mood and affect. Normal behavior. Normal judgment and thought content.   Assessment and Plan:  Pregnancy: G1P0 at 2470w2d  1. Supervision of normal first pregnancy, antepartum Cultures today - Strep Gp B NAA - GC[facility]Chlamydia probe amp [facility]  2. Maternal varicella, non-immune Attempt pp vaccination  Preterm labor symptoms and general obstetric precautions including but not limited to vaginal bleeding, contractions,  leaking of fluid and fetal movement were reviewed in detail with the patient. Please refer to After Visit Summary for other counseling recommendations.  Return in 1 week (on 04/26/2017).   Reva Boresanya S Pratt, MD

## 2017-04-20 LAB — GC/CHLAMYDIA PROBE AMP (~~LOC~~) NOT AT ARMC
Chlamydia: NEGATIVE
Neisseria Gonorrhea: NEGATIVE

## 2017-04-21 LAB — STREP GP B NAA: Strep Gp B NAA: NEGATIVE

## 2017-04-26 ENCOUNTER — Encounter (HOSPITAL_COMMUNITY): Payer: Self-pay | Admitting: *Deleted

## 2017-04-26 ENCOUNTER — Inpatient Hospital Stay (HOSPITAL_COMMUNITY)
Admission: AD | Admit: 2017-04-26 | Discharge: 2017-04-26 | Disposition: A | Discharge status: Home / Self Care | Payer: Medicaid Other | Source: Ambulatory Visit | Attending: Obstetrics and Gynecology | Admitting: Obstetrics and Gynecology

## 2017-04-26 ENCOUNTER — Ambulatory Visit (INDEPENDENT_AMBULATORY_CARE_PROVIDER_SITE_OTHER): Payer: Medicaid Other | Admitting: Obstetrics and Gynecology

## 2017-04-26 DIAGNOSIS — R030 Elevated blood-pressure reading, without diagnosis of hypertension: Secondary | ICD-10-CM | POA: Insufficient documentation

## 2017-04-26 DIAGNOSIS — J45909 Unspecified asthma, uncomplicated: Secondary | ICD-10-CM | POA: Insufficient documentation

## 2017-04-26 DIAGNOSIS — O9989 Other specified diseases and conditions complicating pregnancy, childbirth and the puerperium: Secondary | ICD-10-CM

## 2017-04-26 DIAGNOSIS — Z3A37 37 weeks gestation of pregnancy: Secondary | ICD-10-CM | POA: Insufficient documentation

## 2017-04-26 DIAGNOSIS — O99513 Diseases of the respiratory system complicating pregnancy, third trimester: Secondary | ICD-10-CM | POA: Insufficient documentation

## 2017-04-26 DIAGNOSIS — O26893 Other specified pregnancy related conditions, third trimester: Secondary | ICD-10-CM | POA: Diagnosis not present

## 2017-04-26 DIAGNOSIS — O133 Gestational [pregnancy-induced] hypertension without significant proteinuria, third trimester: Secondary | ICD-10-CM | POA: Insufficient documentation

## 2017-04-26 LAB — COMPREHENSIVE METABOLIC PANEL
ALBUMIN: 3.5 g/dL (ref 3.5–5.0)
ALT: 14 U/L (ref 14–54)
AST: 24 U/L (ref 15–41)
Alkaline Phosphatase: 198 U/L — ABNORMAL HIGH (ref 38–126)
Anion gap: 7 (ref 5–15)
BUN: 6 mg/dL (ref 6–20)
CHLORIDE: 105 mmol/L (ref 101–111)
CO2: 22 mmol/L (ref 22–32)
CREATININE: 0.59 mg/dL (ref 0.44–1.00)
Calcium: 9.1 mg/dL (ref 8.9–10.3)
GFR calc Af Amer: >60 mL/min (ref >60)
GLUCOSE: 86 mg/dL (ref 65–99)
POTASSIUM: 3.8 mmol/L (ref 3.5–5.1)
Sodium: 134 mmol/L — ABNORMAL LOW (ref 135–145)
Total Bilirubin: 0.7 mg/dL (ref 0.3–1.2)
Total Protein: 7.3 g/dL (ref 6.5–8.1)

## 2017-04-26 LAB — CBC
HCT: 32.4 % — ABNORMAL LOW (ref 36.0–46.0)
Hemoglobin: 10.3 g/dL — ABNORMAL LOW (ref 12.0–15.0)
MCH: 28.4 pg (ref 26.0–34.0)
MCHC: 31.8 g/dL (ref 30.0–36.0)
MCV: 89.3 fL (ref 78.0–100.0)
PLATELETS: 362 10*3/uL (ref 150–400)
RBC: 3.63 MIL/uL — AB (ref 3.87–5.11)
RDW: 14.5 % (ref 11.5–15.5)
WBC: 16.6 10*3/uL — AB (ref 4.0–10.5)

## 2017-04-26 LAB — POCT URINALYSIS DIPSTICK
BILIRUBIN UA: NEGATIVE
GLUCOSE UA: NEGATIVE
Ketones, UA: NEGATIVE
LEUKOCYTES UA: NEGATIVE
NITRITE UA: NEGATIVE
PH UA: 7 (ref 5.0–8.0)
RBC UA: NEGATIVE
Spec Grav, UA: 1.015 (ref 1.010–1.025)
UROBILINOGEN UA: 0.2 U/dL

## 2017-04-26 LAB — PROTEIN / CREATININE RATIO, URINE
Creatinine, Urine: 208 mg/dL
Protein Creatinine Ratio: 0.25 mg/mg{Cre} — ABNORMAL HIGH (ref 0.00–0.15)
Total Protein, Urine: 52 mg/dL

## 2017-04-26 NOTE — Discharge Instructions (Signed)
Hypertension During Pregnancy °Hypertension, commonly called high blood pressure, is when the force of blood pumping through your arteries is too strong. Arteries are blood vessels that carry blood from the heart throughout the body. Hypertension during pregnancy can cause problems for you and your baby. Your baby may be born early (prematurely) or may not weigh as much as he or she should at birth. Very bad cases of hypertension during pregnancy can be life-threatening. °Different types of hypertension can occur during pregnancy. These include: °· Chronic hypertension. This happens when: °? You have hypertension before pregnancy and it continues during pregnancy. °? You develop hypertension before you are [redacted] weeks pregnant, and it continues during pregnancy. °· Gestational hypertension. This is hypertension that develops after the 20th week of pregnancy. °· Preeclampsia, also called toxemia of pregnancy. This is a very serious type of hypertension that develops only during pregnancy. It affects the whole body, and it can be very dangerous for you and your baby. ° °Gestational hypertension and preeclampsia usually go away within 6 weeks after your baby is born. Women who have hypertension during pregnancy have a greater chance of developing hypertension later in life or during future pregnancies. °What are the causes? °The exact cause of hypertension is not known. °What increases the risk? °There are certain factors that make it more likely for you to develop hypertension during pregnancy. These include: °· Having hypertension during a previous pregnancy or prior to pregnancy. °· Being overweight. °· Being older than age 40. °· Being pregnant for the first time or being pregnant with more than one baby. °· Becoming pregnant using fertilization methods such as IVF (in vitro fertilization). °· Having diabetes, kidney problems, or systemic lupus erythematosus. °· Having a family history of hypertension. ° °What are the  signs or symptoms? °Chronic hypertension and gestational hypertension rarely cause symptoms. Preeclampsia causes symptoms, which may include: °· Increased protein in your urine. Your health care provider will check for this at every visit before you give birth (prenatal visit). °· Severe headaches. °· Sudden weight gain. °· Swelling of the hands, face, legs, and feet. °· Nausea and vomiting. °· Vision problems, such as blurred or double vision. °· Numbness in the face, arms, legs, and feet. °· Dizziness. °· Slurred speech. °· Sensitivity to bright lights. °· Abdominal pain. °· Convulsions. ° °How is this diagnosed? °You may be diagnosed with hypertension during a routine prenatal exam. At each prenatal visit, you may: °· Have a urine test to check for high amounts of protein in your urine. °· Have your blood pressure checked. A blood pressure reading is recorded as two numbers, such as "120 over 80" (or 120/80). The first ("top") number is called the systolic pressure. It is a measure of the pressure in your arteries when your heart beats. The second ("bottom") number is called the diastolic pressure. It is a measure of the pressure in your arteries as your heart relaxes between beats. Blood pressure is measured in a unit called mm Hg. A normal blood pressure reading is: °? Systolic: below 120. °? Diastolic: below 80. ° °The type of hypertension that you are diagnosed with depends on your test results and when your symptoms developed. °· Chronic hypertension is usually diagnosed before 20 weeks of pregnancy. °· Gestational hypertension is usually diagnosed after 20 weeks of pregnancy. °· Hypertension with high amounts of protein in the urine is diagnosed as preeclampsia. °· Blood pressure measurements that stay above 160 systolic, or above 110 diastolic, are   signs of severe preeclampsia. ° °How is this treated? °Treatment for hypertension during pregnancy varies depending on the type of hypertension you have and how  serious it is. °· If you take medicines called ACE inhibitors to treat chronic hypertension, you may need to switch medicines. ACE inhibitors should not be taken during pregnancy. °· If you have gestational hypertension, you may need to take blood pressure medicine. °· If you are at risk for preeclampsia, your health care provider may recommend that you take a low-dose aspirin every day to prevent high blood pressure during your pregnancy. °· If you have severe preeclampsia, you may need to be hospitalized so you and your baby can be monitored closely. You may also need to take medicine (magnesium sulfate) to prevent seizures and to lower blood pressure. This medicine may be given as an injection or through an IV tube. °· In some cases, if your condition gets worse, you may need to deliver your baby early. ° °Follow these instructions at home: °Eating and drinking °· Drink enough fluid to keep your urine clear or pale yellow. °· Eat a healthy diet that is low in salt (sodium). Do not add salt to your food. Check food labels to see how much sodium a food or beverage contains. °Lifestyle °· Do not use any products that contain nicotine or tobacco, such as cigarettes and e-cigarettes. If you need help quitting, ask your health care provider. °· Do not use alcohol. °· Avoid caffeine. °· Avoid stress as much as possible. Rest and get plenty of sleep. °General instructions °· Take over-the-counter and prescription medicines only as told by your health care provider. °· While lying down, lie on your left side. This keeps pressure off your baby. °· While sitting or lying down, raise (elevate) your feet. Try putting some pillows under your lower legs. °· Exercise regularly. Ask your health care provider what kinds of exercise are best for you. °· Keep all prenatal and follow-up visits as told by your health care provider. This is important. °Contact a health care provider if: °· You have symptoms that your health care  provider told you may require more treatment or monitoring, such as: °? Fever. °? Vomiting. °? Headache. °Get help right away if: °· You have severe abdominal pain or vomiting that does not get better with treatment. °· You suddenly develop swelling in your hands, ankles, or face. °· You gain 4 lbs (1.8 kg) or more in 1 week. °· You develop vaginal bleeding, or you have blood in your urine. °· You do not feel your baby moving as much as usual. °· You have blurred or double vision. °· You have muscle twitching or sudden tightening (spasms). °· You have shortness of breath. °· Your lips or fingernails turn blue. °This information is not intended to replace advice given to you by your health care provider. Make sure you discuss any questions you have with your health care provider. °Document Released: 03/07/2011 Document Revised: 01/07/2016 Document Reviewed: 12/03/2015 °Elsevier Interactive Patient Education © 2018 [employer] ° °

## 2017-04-26 NOTE — Progress Notes (Signed)
Prenatal Visit Note Date: 04/26/2017 Clinic: [facility]  Subjective:  Nicole Mcmahon is a 21 y.o. G1P0 at 1468w2d being seen today for ongoing prenatal care.  She is currently monitored for the following issues for this low-risk pregnancy and has Supervision of normal first pregnancy, antepartum; H/O genital injury; Rubella non-immune status, antepartum; Maternal varicella, non-immune; Anemia affecting pregnancy, antepartum; and Transient hypertension of pregnancy in third trimester on her problem list.  Patient reports no complaints.   Contractions: Not present. Vag. Bleeding: None.  Movement: Present. Denies leaking of fluid.   The following portions of the patient's history were reviewed and updated as appropriate: allergies, current medications, past family history, past medical history, past social history, past surgical history and problem list. Problem list updated.  Objective:   Vitals:   04/26/17 1115 04/26/17 1122  BP: (!) 142/81 (!) 147/79  Pulse: 76   Weight: 173 lb (78.5 kg)     Fetal Status: Fetal Heart Rate (bpm): 140   Movement: Present  Presentation: Vertex  General:  Alert, oriented and cooperative. Patient is in no acute distress.  Skin: Skin is warm and dry. No rash noted.   Cardiovascular: Normal heart rate noted. Normal s1 and s2, no MRGs  Respiratory: Normal respiratory effort, no problems with respiration noted. CTAB  Abdomen: Soft, gravid, appropriate for gestational age. Pain/Pressure: Present     Pelvic:  Cervical exam performed Dilation: 1 Effacement (%): 30 Station: Ballotable  Extremities: Normal range of motion.  Edema: Trace  Mental Status: Normal mood and affect. Normal behavior. Normal judgment and thought content.   Neuro: 1+ brachial  Urinalysis: Urine Protein: 2+    Assessment and Plan:  Pregnancy: G1P0 at 4168w2d  1. Transient hypertension of pregnancy in third trimester Recommend MAU for pre-x evaluation. Pt  and husband amenable to plan. D/w them reasons they may keep her or send her home with close outpatient f/u.   Term labor symptoms and general obstetric precautions including but not limited to vaginal bleeding, contractions, leaking of fluid and fetal movement were reviewed in detail with the patient. Please refer to After Visit Summary for other counseling recommendations.  Return in about 4 days (around 04/30/2017) for BP check only. 1wk rob.   [place] BingPickens, Hadiya Spoerl, MD

## 2017-04-26 NOTE — MAU Note (Signed)
Patient states was at [facility] today and her BP was elevated. Sent for further evaluation.  Denies headache, changes in vision, epigastric. Does states she notes a slight increase in swelling in her feet.   Denies vaginal bleeding or LOF  +FM

## 2017-04-26 NOTE — MAU Provider Note (Signed)
History     CSN: [id number]  Arrival date and time: 04/26/17 1332   None     Chief Complaint  Patient presents with  . Hypertension   HPI Nicole Mcmahon is a 21 y.o. G1P0 at 74w2dwho presents from the office for BP evaluation. New onset hypertension in office today. Denies history of hypertension. Denies headache, visual disturbance, or epigastric pain. Positive fetal movement.   OB History    Gravida Para Term Preterm AB Living   1             SAB TAB Ectopic Multiple Live Births                  Past Medical History:  Diagnosis Date  . Asthma   . H/O genital injury    Childhood injury where she jumped and landed on a stick that went into her vagina.     History reviewed. No pertinent surgical history.  History reviewed. No pertinent family history.  Social History  Substance Use Topics  . Smoking status: Never Smoker  . Smokeless tobacco: Never Used  . Alcohol use No    Allergies: No Known Allergies  Prescriptions Prior to Admission  Medication Sig Dispense Refill Last Dose  . ranitidine (ZANTAC) 150 MG tablet Take 1 tablet (150 mg total) by mouth 2 (two) times daily. 60 tablet 11 04/25/2017 at Unknown time  . ferrous sulfate (FERROUSUL) 325 (65 FE) MG tablet Take 1 tablet (325 mg total) by mouth 2 (two) times daily. 60 tablet 1 Taking  . Prenat w/o A-FeCbGl-DSS-FA-DHA (CITRANATAL 90 DHA) 90-1 & 300 MG MISC Take 1 tablet by mouth daily. 30 each 12 Taking    Review of Systems  Constitutional: Negative.   Eyes: Negative.   Gastrointestinal: Negative.   Genitourinary: Negative.   Neurological: Negative.    Physical Exam   Blood pressure 128/71, pulse (!) 112, temperature 98.6 F (37 C), temperature source Oral, resp. rate 16, last menstrual period 08/08/2016, SpO2 100 %. Patient Vitals for the past 24 hrs:  BP Temp Temp src Pulse Resp SpO2  04/26/17 1528 - - - - 16 100 %  04/26/17 1515 128/71 - - (!) 112 - 100 %  04/26/17 1500 139/76 - - (!) 112 - -   04/26/17 1445 125/87 - - 87 - -  04/26/17 1430 126/80 - - 92 - -  04/26/17 1416 132/87 - - 95 - -  04/26/17 1400 (!) 139/97 - - (!) 113 - -  04/26/17 1354 118/85 98.6 F (37 C) Oral 94 16 100 %    Physical Exam  Nursing note and vitals reviewed. Constitutional: She is oriented to person, place, and time. She appears well-developed and well-nourished. No distress.  HENT:  Head: Normocephalic and atraumatic.  Eyes: Conjunctivae are normal. Right eye exhibits no discharge. Left eye exhibits no discharge. No scleral icterus.  Neck: Normal range of motion.  Cardiovascular: Normal rate, regular rhythm and normal heart sounds.   No murmur heard. Respiratory: Effort normal and breath sounds normal. No respiratory distress. She has no wheezes.  GI: Soft. There is no tenderness.  Neurological: She is alert and oriented to person, place, and time. She has normal reflexes.  Skin: Skin is warm and dry. She is not diaphoretic.  Psychiatric: She has a normal mood and affect. Her behavior is normal. Judgment and thought content normal.   Fetal Tracing:  Baseline: 145 Variability: moderate Accelerations: 15x15 Decelerations: none  Toco: 3-5 mins MAU  Course  Procedures Results for orders placed or performed during the hospital encounter of 04/26/17 (from the past 48 hour(s))  Protein / creatinine ratio, urine     Status: Abnormal   Collection Time: 04/26/17  1:48 PM  Result Value Ref Range   Creatinine, Urine 208.00 mg/dL   Total Protein, Urine 52 mg/dL    Comment: NO NORMAL RANGE ESTABLISHED FOR THIS TEST   Protein Creatinine Ratio 0.25 (H) 0.00 - 0.15 mg/mg[Cre]  CBC     Status: Abnormal   Collection Time: 04/26/17  1:55 PM  Result Value Ref Range   WBC 16.6 (H) 4.0 - 10.5 K/uL   RBC 3.63 (L) 3.87 - 5.11 MIL/uL   Hemoglobin 10.3 (L) 12.0 - 15.0 g/dL   HCT 32.4 (L) 36.0 - 46.0 %   MCV 89.3 78.0 - 100.0 fL   MCH 28.4 26.0 - 34.0 pg   MCHC 31.8 30.0 - 36.0 g/dL   RDW 14.5 11.5 -  15.5 %   Platelets 362 150 - 400 K/uL  Comprehensive metabolic panel     Status: Abnormal   Collection Time: 04/26/17  1:55 PM  Result Value Ref Range   Sodium 134 (L) 135 - 145 mmol/L   Potassium 3.8 3.5 - 5.1 mmol/L   Chloride 105 101 - 111 mmol/L   CO2 22 22 - 32 mmol/L   Glucose, Bld 86 65 - 99 mg/dL   BUN 6 6 - 20 mg/dL   Creatinine, Ser 0.59 0.44 - 1.00 mg/dL   Calcium 9.1 8.9 - 10.3 mg/dL   Total Protein 7.3 6.5 - 8.1 g/dL   Albumin 3.5 3.5 - 5.0 g/dL   AST 24 15 - 41 U/L   ALT 14 14 - 54 U/L   Alkaline Phosphatase 198 (H) 38 - 126 U/L   Total Bilirubin 0.7 0.3 - 1.2 mg/dL   GFR calc non Af Amer >60 >60 mL/min   GFR calc Af Amer >60 >60 mL/min    Comment: (NOTE) The eGFR has been calculated using the CKD EPI equation. This calculation has not been validated in all clinical situations. eGFR's persistently <60 mL/min signify possible Chronic Kidney Disease.    Anion gap 7 5 - 15    MDM Reacitve NST Elevated BP x 1, less than 4 hours apart from previous elevated BP in office. Otherwise normotensive & asymptomatic. CBC, CMP, urine PCR wnl.   Assessment and Plan  A: 1. Elevated BP without diagnosis of hypertension   2. [redacted] weeks gestation of pregnancy    P: Discharge home Discussed reasons to return to MAU including s/s of preeclampsia Patient has f/u with OB on Monday  Jorje Guild 04/26/2017, 3:21 PM

## 2017-04-30 ENCOUNTER — Ambulatory Visit (INDEPENDENT_AMBULATORY_CARE_PROVIDER_SITE_OTHER): Payer: Medicaid Other | Admitting: *Deleted

## 2017-04-30 VITALS — BP 120/86 | HR 112

## 2017-04-30 DIAGNOSIS — O133 Gestational [pregnancy-induced] hypertension without significant proteinuria, third trimester: Secondary | ICD-10-CM

## 2017-04-30 NOTE — Progress Notes (Addendum)
Subjective:  Nicole Mcmahon is a 21 y.o. female with hypertension.   Hypertension ROS: no TIA's, no chest pain on exertion, no dyspnea on exertion and no swelling of ankles.    Objective:  BP 120/86   Pulse (!) 112   LMP 08/08/2016    Vitals:   04/30/17 1404 04/30/17 1502  BP: (!) 126/94 120/86  Pulse: (!) 112    3+ Protein on POCT Urine Dip Appearance alert, well appearing, and in no distress. General exam BP noted to be well controlled today in office.    Assessment:   Hypertension stable.   Plan:  Current treatment plan is effective, no change in therapy. Strict pre-eclampsia precautions given.

## 2017-05-03 ENCOUNTER — Inpatient Hospital Stay (HOSPITAL_COMMUNITY): Payer: Medicaid Other | Admitting: Anesthesiology

## 2017-05-03 ENCOUNTER — Ambulatory Visit (INDEPENDENT_AMBULATORY_CARE_PROVIDER_SITE_OTHER): Payer: Medicaid Other | Admitting: Obstetrics & Gynecology

## 2017-05-03 ENCOUNTER — Encounter: Payer: Self-pay | Admitting: Radiology

## 2017-05-03 ENCOUNTER — Encounter (HOSPITAL_COMMUNITY): Payer: Self-pay | Admitting: *Deleted

## 2017-05-03 ENCOUNTER — Inpatient Hospital Stay (HOSPITAL_COMMUNITY)
Admission: AD | Admit: 2017-05-03 | Discharge: 2017-05-06 | DRG: 807 | Disposition: A | Discharge status: Home / Self Care | Payer: Medicaid Other | Source: Ambulatory Visit | Attending: Obstetrics and Gynecology | Admitting: Obstetrics and Gynecology

## 2017-05-03 VITALS — BP 148/82 | HR 76

## 2017-05-03 DIAGNOSIS — Z3480 Encounter for supervision of other normal pregnancy, unspecified trimester: Secondary | ICD-10-CM

## 2017-05-03 DIAGNOSIS — O9902 Anemia complicating childbirth: Secondary | ICD-10-CM | POA: Diagnosis present

## 2017-05-03 DIAGNOSIS — Z3A38 38 weeks gestation of pregnancy: Secondary | ICD-10-CM | POA: Diagnosis not present

## 2017-05-03 DIAGNOSIS — O134 Gestational [pregnancy-induced] hypertension without significant proteinuria, complicating childbirth: Principal | ICD-10-CM | POA: Diagnosis present

## 2017-05-03 DIAGNOSIS — D573 Sickle-cell trait: Secondary | ICD-10-CM | POA: Diagnosis present

## 2017-05-03 DIAGNOSIS — O133 Gestational [pregnancy-induced] hypertension without significant proteinuria, third trimester: Secondary | ICD-10-CM | POA: Diagnosis present

## 2017-05-03 DIAGNOSIS — O0993 Supervision of high risk pregnancy, unspecified, third trimester: Secondary | ICD-10-CM

## 2017-05-03 HISTORY — DX: Disease of blood and blood-forming organs, unspecified: D75.9

## 2017-05-03 HISTORY — DX: Gestational (pregnancy-induced) hypertension without significant proteinuria, unspecified trimester: O13.9

## 2017-05-03 HISTORY — DX: Sickle-cell trait: D57.3

## 2017-05-03 LAB — CBC
HEMATOCRIT: 30.2 % — AB (ref 36.0–46.0)
HEMATOCRIT: 29.4 % — AB (ref 36.0–46.0)
HEMOGLOBIN: 9.8 g/dL — AB (ref 12.0–15.0)
Hemoglobin: 9.9 g/dL — ABNORMAL LOW (ref 12.0–15.0)
MCH: 28.9 pg (ref 26.0–34.0)
MCH: 29.2 pg (ref 26.0–34.0)
MCHC: 32.8 g/dL (ref 30.0–36.0)
MCHC: 33.3 g/dL (ref 30.0–36.0)
MCV: 88.3 fL (ref 78.0–100.0)
MCV: 87.5 fL (ref 78.0–100.0)
Platelets: 315 10*3/uL (ref 150–400)
Platelets: 314 10*3/uL (ref 150–400)
RBC: 3.42 MIL/uL — ABNORMAL LOW (ref 3.87–5.11)
RBC: 3.36 MIL/uL — AB (ref 3.87–5.11)
RDW: 15 % (ref 11.5–15.5)
RDW: 14.9 % (ref 11.5–15.5)
WBC: 15.8 10*3/uL — ABNORMAL HIGH (ref 4.0–10.5)
WBC: 19.9 10*3/uL — ABNORMAL HIGH (ref 4.0–10.5)

## 2017-05-03 LAB — COMPREHENSIVE METABOLIC PANEL
ALK PHOS: 195 U/L — AB (ref 38–126)
ALT: 14 U/L (ref 14–54)
AST: 24 U/L (ref 15–41)
Albumin: 3.4 g/dL — ABNORMAL LOW (ref 3.5–5.0)
Anion gap: 8 (ref 5–15)
BUN: <5 mg/dL — ABNORMAL LOW (ref 6–20)
CALCIUM: 9.4 mg/dL (ref 8.9–10.3)
CHLORIDE: 107 mmol/L (ref 101–111)
CO2: 21 mmol/L — AB (ref 22–32)
CREATININE: 0.64 mg/dL (ref 0.44–1.00)
GFR calc non Af Amer: >60 mL/min (ref >60)
GLUCOSE: 82 mg/dL (ref 65–99)
Potassium: 4 mmol/L (ref 3.5–5.1)
SODIUM: 136 mmol/L (ref 135–145)
Total Bilirubin: 0.6 mg/dL (ref 0.3–1.2)
Total Protein: 7 g/dL (ref 6.5–8.1)

## 2017-05-03 LAB — TYPE AND SCREEN
ABO/RH(D): O POS
Antibody Screen: NEGATIVE

## 2017-05-03 LAB — PROTEIN / CREATININE RATIO, URINE
Creatinine, Urine: 32 mg/dL
PROTEIN CREATININE RATIO: 0.28 mg/mg{creat} — AB (ref 0.00–0.15)
TOTAL PROTEIN, URINE: 9 mg/dL

## 2017-05-03 LAB — OB RESULTS CONSOLE GBS: STREP GROUP B AG: NEGATIVE

## 2017-05-03 MED ORDER — LACTATED RINGERS IV SOLN
INTRAVENOUS | Status: DC
  Administered 2017-05-03 – 2017-05-04 (×4): via INTRAVENOUS

## 2017-05-03 MED ORDER — ACETAMINOPHEN 325 MG PO TABS: 650.0000 mg | ORAL_TABLET | ORAL | Status: DC | PRN

## 2017-05-03 MED ORDER — LACTATED RINGERS IV SOLN: 500.0000 mL | INTRAVENOUS | Status: DC | PRN

## 2017-05-03 MED ORDER — FENTANYL CITRATE (PF) 100 MCG/2ML IJ SOLN: 100.0000 ug | INTRAMUSCULAR | Status: DC | PRN

## 2017-05-03 MED ORDER — LIDOCAINE HCL (PF) 1 % IJ SOLN
30.0000 mL | INTRAMUSCULAR | Status: DC | PRN
  Filled 2017-05-03: qty 30

## 2017-05-03 MED ORDER — FENTANYL CITRATE (PF) 100 MCG/2ML IJ SOLN
100.0000 ug | INTRAMUSCULAR | Status: DC | PRN
  Administered 2017-05-03 (×3): 100 ug via INTRAVENOUS
  Filled 2017-05-03 (×3): qty 2

## 2017-05-03 MED ORDER — OXYCODONE-ACETAMINOPHEN 5-325 MG PO TABS: 1.0000 | ORAL_TABLET | ORAL | Status: DC | PRN

## 2017-05-03 MED ORDER — ONDANSETRON HCL 4 MG/2ML IJ SOLN: 4.0000 mg | Freq: Four times a day (QID) | INTRAMUSCULAR | Status: DC | PRN

## 2017-05-03 MED ORDER — OXYTOCIN BOLUS FROM INFUSION
500.0000 mL | Freq: Once | INTRAVENOUS | Status: AC
  Administered 2017-05-04: 500 mL via INTRAVENOUS

## 2017-05-03 MED ORDER — PHENYLEPHRINE 40 MCG/ML (10ML) SYRINGE FOR IV PUSH (FOR BLOOD PRESSURE SUPPORT)
80.0000 ug | PREFILLED_SYRINGE | INTRAVENOUS | Status: DC | PRN
  Filled 2017-05-03: qty 5

## 2017-05-03 MED ORDER — EPHEDRINE 5 MG/ML INJ
10.0000 mg | INTRAVENOUS | Status: DC | PRN
  Filled 2017-05-03: qty 2

## 2017-05-03 MED ORDER — OXYTOCIN 40 UNITS IN LACTATED RINGERS INFUSION - SIMPLE MED
2.5000 [IU]/h | INTRAVENOUS | Status: DC
  Filled 2017-05-03: qty 1000

## 2017-05-03 MED ORDER — TERBUTALINE SULFATE 1 MG/ML IJ SOLN: 0.2500 mg | Freq: Once | INTRAMUSCULAR | Status: DC | PRN

## 2017-05-03 MED ORDER — MISOPROSTOL 200 MCG PO TABS
50.0000 ug | ORAL_TABLET | ORAL | Status: DC | PRN
  Administered 2017-05-03: 50 ug via ORAL
  Filled 2017-05-03: qty 1

## 2017-05-03 MED ORDER — DIPHENHYDRAMINE HCL 50 MG/ML IJ SOLN: 12.5000 mg | INTRAMUSCULAR | Status: DC | PRN

## 2017-05-03 MED ORDER — PHENYLEPHRINE 40 MCG/ML (10ML) SYRINGE FOR IV PUSH (FOR BLOOD PRESSURE SUPPORT)
80.0000 ug | PREFILLED_SYRINGE | INTRAVENOUS | Status: DC | PRN
  Filled 2017-05-03: qty 5
  Filled 2017-05-03: qty 10

## 2017-05-03 MED ORDER — OXYCODONE-ACETAMINOPHEN 5-325 MG PO TABS: 2.0000 | ORAL_TABLET | ORAL | Status: DC | PRN

## 2017-05-03 MED ORDER — FLEET ENEMA 7-19 GM/118ML RE ENEM: 1.0000 | ENEMA | Freq: Every day | RECTAL | Status: DC | PRN

## 2017-05-03 MED ORDER — FENTANYL 2.5 MCG/ML BUPIVACAINE 1/10 % EPIDURAL INFUSION (WH - ANES)
14.0000 mL/h | INTRAMUSCULAR | Status: DC | PRN
  Administered 2017-05-04 (×3): 14 mL/h via EPIDURAL
  Filled 2017-05-03 (×3): qty 100

## 2017-05-03 MED ORDER — SOD CITRATE-CITRIC ACID 500-334 MG/5ML PO SOLN: 30.0000 mL | ORAL | Status: DC | PRN

## 2017-05-03 MED ORDER — LACTATED RINGERS IV SOLN
500.0000 mL | Freq: Once | INTRAVENOUS | Status: AC
  Administered 2017-05-03: 500 mL via INTRAVENOUS

## 2017-05-03 NOTE — H&P (Signed)
Obstetric History and Physical  Nicole Mcmahon is a 21 y.o. G1P0 with IUP at 5182w2d presenting for IOL for gHTN. Was seen in clinic today and blood pressure was 152/80. Patient states she has been having  none contractions, none vaginal bleeding, intact membranes, with active fetal movement.    [facility]renatal Course [facility]  Pregnancy complications or risks: Patient Active Problem List   Diagnosis Date Noted  . Gestational hypertension, third trimester 04/26/2017  . Rubella non-immune status, antepartum 01/22/2017  . Maternal varicella, non-immune 01/22/2017  . Anemia affecting pregnancy, antepartum 01/22/2017  . Supervision of high-risk pregnancy 01/17/2017  . H/O genital injury 01/17/2017   She plans to breastfeed She desires condoms for postpartum contraception.   Prenatal labs and studies: ABO, Rh: --/--/O POS (11/01 1248) Antibody: PENDING (11/01 1248) Rubella: <0.90 (07/18 1604) RPR: Non Reactive (08/24 0852)  HBsAg: Negative (07/18 1604)  HIV:   non reactive ZOX:WRUEAVWUGBS:Negative (11/01 1235) 2 hr Glucola  83 Genetic screening normal Anatomy US normal  Prenatal Transfer Tool  Maternal Diabetes: No Genetic Screening: Normal Maternal Ultrasounds/Referrals: Normal Fetal Ultrasounds or other Referrals:  None Maternal Substance Abuse:  No Significant Maternal Medications:  None Significant Maternal Lab Results: Lab values include: Other:  rubella and varicella non immune  Past Medical History:  Diagnosis Date  . Asthma   . Blood dyscrasia   . H/O genital injury    Childhood injury where she jumped and landed on a stick that went into her vagina.   . Pregnancy induced hypertension   . Sickle cell trait (HCC)     History reviewed. No pertinent surgical history.  OB History  Gravida Para Term Preterm AB Living  1            SAB TAB Ectopic Multiple Live Births               # Outcome Date GA Lbr Len/2nd Weight Sex Delivery Anes PTL Lv  1 Current                Social History   Social History  . Marital status: Married    Spouse name: N/A  . Number of children: N/A  . Years of education: N/A   Social History Main Topics  . Smoking status: Never Smoker  . Smokeless tobacco: Never Used  . Alcohol use No  . Drug use: No  . Sexual activity: No   Other Topics Concern  . None   Social History Narrative  . None    History reviewed. No pertinent family history.  Prescriptions Prior to Admission  Medication Sig Dispense Refill Last Dose  . ferrous sulfate (FERROUSUL) 325 (65 FE) MG tablet Take 1 tablet (325 mg total) by mouth 2 (two) times daily. 60 tablet 1 Taking  . Prenat w/o A-FeCbGl-DSS-FA-DHA (CITRANATAL 90 DHA) 90-1 & 300 MG MISC Take 1 tablet by mouth daily. 30 each 12 Taking  . ranitidine (ZANTAC) 150 MG tablet Take 1 tablet (150 mg total) by mouth 2 (two) times daily. 60 tablet 11 Taking    No Known Allergies  Review of Systems: Negative except for what is mentioned in HPI.  Physical Exam: BP (!) 146/87   Pulse 97   Temp 98.4 F (36.9 C) (Oral)   Resp 18   Ht 5\' 1"  (1.549 m)   Wt 175 lb (79.4 kg)   LMP 08/08/2016   BMI 33.07 kg/m  CONSTITUTIONAL: Well-developed, well-nourished female in no acute distress.  HENT:  Normocephalic, atraumatic, External right and left ear normal. Oropharynx is clear and moist EYES: Conjunctivae and EOM are normal. Pupils are equal, round, and reactive to light. No scleral icterus.  NECK: Normal range of motion, supple, no masses SKIN: Skin is warm and dry. No rash noted. Not diaphoretic. No erythema. No pallor. NEUROLOGIC: Alert and oriented to person, place, and time. Normal reflexes, muscle tone coordination. No cranial nerve deficit noted. PSYCHIATRIC: Normal mood and affect. Normal behavior. Normal judgment and thought content. CARDIOVASCULAR: Normal heart rate noted, regular rhythm RESPIRATORY: Effort and breath sounds normal, no problems with respiration noted ABDOMEN: Soft,  nontender, nondistended, gravid. MUSCULOSKELETAL: Normal range of motion. No edema and no tenderness. 2+ distal pulses.  Cervical Exam: Dilatation 2 cm   Effacement 10%   Station -3   Presentation: cephalic FHT:  Baseline rate 150 bpm   Variability moderate  Accelerations present   Decelerations none Contractions: none   Pertinent Labs/Studies:   Results for orders placed or performed during the hospital encounter of 05/03/17 (from the past 24 hour(s))  OB RESULT CONSOLE Group B Strep     Status: None   Collection Time: 05/03/17 12:35 PM  Result Value Ref Range   GBS Negative   CBC     Status: Abnormal   Collection Time: 05/03/17 12:48 PM  Result Value Ref Range   WBC 15.8 (H) 4.0 - 10.5 K/uL   RBC 3.42 (L) 3.87 - 5.11 MIL/uL   Hemoglobin 9.9 (L) 12.0 - 15.0 g/dL   HCT 56.2 (L) 13.0 - 86.5 %   MCV 88.3 78.0 - 100.0 fL   MCH 28.9 26.0 - 34.0 pg   MCHC 32.8 30.0 - 36.0 g/dL   RDW 78.4 69.6 - 29.5 %   Platelets 315 150 - 400 K/uL  Type and screen [facility]     Status: None (Preliminary result)   Collection Time: 05/03/17 12:48 PM  Result Value Ref Range   ABO/RH(D) O POS    Antibody Screen PENDING    Sample Expiration 05/06/2017     Assessment : Nicole Mcmahon is a 21 y.o. G1P0 at [redacted]w[redacted]d being admitted induction of labor due to gHTN.  Plan: Labor: Foley bulb placed. cytotec q4hr prn for cervical ripening Analgesia as needed. GHTN: asymptomatic. Pending labs FWB: Reassuring fetal heart tracing.  GBS negative Delivery plan: Hopeful for vaginal delivery  [employer], DO

## 2017-05-03 NOTE — Progress Notes (Signed)
Pt's heart rate is irregular - Elevated electronic and manually BP in office today

## 2017-05-03 NOTE — Anesthesia Pain Management Evaluation Note (Signed)
  CRNA Pain Management Visit Note  Patient: Nicole Mcmahon, 21 y.o., female  "Hello [facility] am [facility]. We have an anesthesia team available at all times to provide care throughout the hospital, including epidural management and anesthesia for C-section. I don't know your plan for the delivery whether it a natural birth, water birth, IV sedation, nitrous supplementation, doula or epidural, but we want to meet your pain goals."   1.Was your pain managed to your expectations on prior hospitalizations?   No prior hospitalizations  2.What is your expectation for pain management during this hospitalization?     Nitrous Oxide  3.How can we help you reach that goal? unsure  Record the patient's initial score and the patient's pain goal.   Pain:1  Pain Goal:5 [facility] wants you to be able to say your pain was always managed very well.  Cephus ShellingBURGER,Nicole Mcmahon 05/03/2017

## 2017-05-03 NOTE — Progress Notes (Signed)
   PRENATAL VISIT NOTE  Subjective:  Nicole Mcmahon is a 21 y.o. G1P0 at 4036w2d being seen today for ongoing prenatal care.  She is currently monitored for the following issues for this high-risk pregnancy and has Supervision of high-risk pregnancy; H/O genital injury; Rubella non-immune status, antepartum; Maternal varicella, non-immune; Anemia affecting pregnancy, antepartum; and Gestational hypertension, third trimester on her problem list.  Patient reports no complaints.  Denies headaches, visual symptoms, RUQ/epigastric pain.  Contractions: Not present. Vag. Bleeding: None.  Movement: Present. Denies leaking of fluid.   The following portions of the patient's history were reviewed and updated as appropriate: allergies, current medications, past family history, past medical history, past social history, past surgical history and problem list. Problem list updated.  Objective:   Vitals:   05/03/17 0919 05/03/17 0935  BP: (!) 152/80 (!) 148/82  Pulse: 62 76    Fetal Status: Fetal Heart Rate (bpm): NST   Movement: Present     General:  Alert, oriented and cooperative. Patient is in no acute distress.  Skin: Skin is warm and dry. No rash noted.   Cardiovascular: Normal heart rate noted  Respiratory: Normal respiratory effort, no problems with respiration noted  Abdomen: Soft, gravid, appropriate for gestational age.  Pain/Pressure: Present     Pelvic: Cervical exam deferred        Extremities: Normal range of motion.  Edema: Trace  Mental Status:  Normal mood and affect. Normal behavior. Normal judgment and thought content.   Assessment and Plan:  Pregnancy: G1P0 at 4736w2d  1. Gestational hypertension, third trimester Patient meets criteria for GHTN, no symptoms of preeclampsia. [facility], patient agrees with plan. [facility] Had RNST today in clinic.  L&D staff notified.  Please refer to After Visit Summary for other counseling  recommendations.  Return in about 1 week (around 05/10/2017) for BP visit  4 weeks: Postpartum visit.   Jaynie CollinsUgonna Srihan Brutus, MD

## 2017-05-03 NOTE — Patient Instructions (Addendum)
Return to clinic for any scheduled appointments or obstetric concerns, or go to [facility] for evaluation   Labor Induction Labor induction is when steps are taken to cause a pregnant woman to begin the labor process. Most women go into labor on their own between 37 weeks and 42 weeks of the pregnancy. When this does not happen or when there is a medical need, methods may be used to induce labor. Labor induction causes a pregnant woman's uterus to contract. It also causes the cervix to soften (ripen), open (dilate), and thin out (efface). Usually, labor is not induced before 39 weeks of the pregnancy unless there is a problem with the baby or mother. Before inducing labor, your health care provider will consider a number of factors, including the following:  The medical condition of you and the baby.  How many weeks along you are.  The status of the baby's lung maturity.  The condition of the cervix.  The position of the baby.  What are the reasons for labor induction? Labor may be induced for the following reasons:  The health of the baby or mother is at risk.  The pregnancy is overdue by 1 week or more.  The water breaks but labor does not start on its own.  The mother has a health condition or serious illness, such as high blood pressure, infection, placental abruption, or diabetes.  The amniotic fluid amounts are low around the baby.  The baby is distressed.  Convenience or wanting the baby to be born on a certain date is not a reason for inducing labor. What methods are used for labor induction? Several methods of labor induction may be used, such as:  Prostaglandin medicine. This medicine causes the cervix to dilate and ripen. The medicine will also start contractions. It can be taken by mouth or by inserting a suppository into the vagina.  Inserting a thin tube (catheter) with a balloon on the end into the vagina to dilate the cervix. Once inserted, the balloon is expanded  with water, which causes the cervix to open.  Stripping the membranes. Your health care provider separates amniotic sac tissue from the cervix, causing the cervix to be stretched and causing the release of a hormone called progesterone. This may cause the uterus to contract. It is often done during an office visit. You will be sent home to wait for the contractions to begin. You will then come in for an induction.  Breaking the water. Your health care provider makes a hole in the amniotic sac using a small instrument. Once the amniotic sac breaks, contractions should begin. This may still take hours to see an effect.  Medicine to trigger or strengthen contractions. This medicine is given through an IV access tube inserted into a vein in your arm.  All of the methods of induction, besides stripping the membranes, will be done in the hospital. Induction is done in the hospital so that you and the baby can be carefully monitored. How long does it take for labor to be induced? Some inductions can take up to 2-3 days. Depending on the cervix, it usually takes less time. It takes longer when you are induced early in the pregnancy or if this is your first pregnancy. If a mother is still pregnant and the induction has been going on for 2-3 days, either the mother will be sent home or a cesarean delivery will be needed. What are the risks associated with labor induction? Some of the risks   of induction include:  Changes in fetal heart rate, such as too high, too low, or erratic.  Fetal distress.  Chance of infection for the mother and baby.  Increased chance of having a cesarean delivery.  Breaking off (abruption) of the placenta from the uterus (rare).  Uterine rupture (very rare).  When induction is needed for medical reasons, the benefits of induction may outweigh the risks. What are some reasons for not inducing labor? Labor induction should not be done if:  It is shown that your baby does  not tolerate labor.  You have had previous surgeries on your uterus, such as a myomectomy or the removal of fibroids.  Your placenta lies very low in the uterus and blocks the opening of the cervix (placenta previa).  Your baby is not in a head-down position.  The umbilical cord drops down into the birth canal in front of the baby. This could cut off the baby's blood and oxygen supply.  You have had a previous cesarean delivery.  There are unusual circumstances, such as the baby being extremely premature.  This information is not intended to replace advice given to you by your health care provider. Make sure you discuss any questions you have with your health care provider. Document Released: 11/08/2006 Document Revised: 11/25/2015 Document Reviewed: 01/16/2013 Elsevier Interactive Patient Education  2017 [employer] Inc[employer]  

## 2017-05-03 NOTE — Progress Notes (Signed)
Nicole Mcmahon is a 21 y.o. G1P0 at 4177w2d induction of labor due to gHTN.  Subjective:   Objective: BP (!) 147/79   Pulse (!) 113   Temp 98.7 F (37.1 C) (Oral)   Resp 20   Ht 5\' 1"  (1.549 m)   Wt 175 lb (79.4 kg)   LMP 08/08/2016   BMI 33.07 kg/m  No intake/output data recorded. No intake/output data recorded.  FHT:  FHR: 140 bpm, variability: moderate,  accelerations:  Present,  decelerations:  Absent UC:   irregular, every 2-5 minutes SVE:   Dilation: 4 Effacement (%): 30 Station: -2 Exam by:: L.Stubbs, RN  Labs: Lab Results  Component Value Date   WBC 15.8 (H) 05/03/2017   HGB 9.9 (L) 05/03/2017   HCT 30.2 (L) 05/03/2017   MCV 88.3 05/03/2017   PLT 315 05/03/2017    Assessment / Plan: Augmentation of labor, progressing well, s/p Foley bulb and SROM  Labor: Progressing normally and consider starting Pitocin if contractions are not adequate at next check, for now she is having good contraction Preeclampsia:  n/a Fetal Wellbeing:  Category I Pain Control:  Epidural and IV pain meds I/D:  GBS neg Anticipated MOD:  NSVD  Nicole Mcmahon 05/03/2017, 9:27 PM

## 2017-05-04 ENCOUNTER — Encounter (HOSPITAL_COMMUNITY): Payer: Self-pay | Admitting: *Deleted

## 2017-05-04 DIAGNOSIS — O134 Gestational [pregnancy-induced] hypertension without significant proteinuria, complicating childbirth: Secondary | ICD-10-CM

## 2017-05-04 DIAGNOSIS — Z3A38 38 weeks gestation of pregnancy: Secondary | ICD-10-CM

## 2017-05-04 LAB — RPR: RPR Ser Ql: NONREACTIVE

## 2017-05-04 MED ORDER — DIPHENHYDRAMINE HCL 25 MG PO CAPS: 25.0000 mg | ORAL_CAPSULE | Freq: Four times a day (QID) | ORAL | Status: DC | PRN

## 2017-05-04 MED ORDER — ONDANSETRON HCL 4 MG/2ML IJ SOLN: 4.0000 mg | INTRAMUSCULAR | Status: DC | PRN

## 2017-05-04 MED ORDER — SIMETHICONE 80 MG PO CHEW: 80.0000 mg | CHEWABLE_TABLET | ORAL | Status: DC | PRN

## 2017-05-04 MED ORDER — IBUPROFEN 600 MG PO TABS
600.0000 mg | ORAL_TABLET | Freq: Four times a day (QID) | ORAL | Status: DC
  Administered 2017-05-04 – 2017-05-06 (×8): 600 mg via ORAL
  Filled 2017-05-04 (×8): qty 1

## 2017-05-04 MED ORDER — COCONUT OIL OIL: 1.0000 "application " | TOPICAL_OIL | Status: DC | PRN

## 2017-05-04 MED ORDER — SENNOSIDES-DOCUSATE SODIUM 8.6-50 MG PO TABS
2.0000 | ORAL_TABLET | ORAL | Status: DC
  Administered 2017-05-05 (×2): 2 via ORAL
  Filled 2017-05-04 (×2): qty 2

## 2017-05-04 MED ORDER — ONDANSETRON HCL 4 MG PO TABS: 4.0000 mg | ORAL_TABLET | ORAL | Status: DC | PRN

## 2017-05-04 MED ORDER — TERBUTALINE SULFATE 1 MG/ML IJ SOLN
0.2500 mg | Freq: Once | INTRAMUSCULAR | Status: DC | PRN
  Filled 2017-05-04: qty 1

## 2017-05-04 MED ORDER — PRENATAL MULTIVITAMIN CH
1.0000 | ORAL_TABLET | Freq: Every day | ORAL | Status: DC
  Administered 2017-05-05 – 2017-05-06 (×2): 1 via ORAL
  Filled 2017-05-04 (×2): qty 1

## 2017-05-04 MED ORDER — TETANUS-DIPHTH-ACELL PERTUSSIS 5-2.5-18.5 LF-MCG/0.5 IM SUSP: 0.5000 mL | Freq: Once | INTRAMUSCULAR | Status: DC

## 2017-05-04 MED ORDER — ZOLPIDEM TARTRATE 5 MG PO TABS: 5.0000 mg | ORAL_TABLET | Freq: Every evening | ORAL | Status: DC | PRN

## 2017-05-04 MED ORDER — WITCH HAZEL-GLYCERIN EX PADS: 1.0000 "application " | MEDICATED_PAD | CUTANEOUS | Status: DC | PRN

## 2017-05-04 MED ORDER — OXYTOCIN 40 UNITS IN LACTATED RINGERS INFUSION - SIMPLE MED
1.0000 m[IU]/min | INTRAVENOUS | Status: DC
  Administered 2017-05-04: 2 m[IU]/min via INTRAVENOUS

## 2017-05-04 MED ORDER — BENZOCAINE-MENTHOL 20-0.5 % EX AERO: 1.0000 "application " | INHALATION_SPRAY | CUTANEOUS | Status: DC | PRN

## 2017-05-04 MED ORDER — LACTATED RINGERS IV SOLN
INTRAVENOUS | Status: DC
  Administered 2017-05-04: 11:00:00 via INTRAUTERINE

## 2017-05-04 MED ORDER — ACETAMINOPHEN 325 MG PO TABS
650.0000 mg | ORAL_TABLET | ORAL | Status: DC | PRN
  Administered 2017-05-04: 650 mg via ORAL
  Filled 2017-05-04: qty 2

## 2017-05-04 MED ORDER — DIBUCAINE 1 % RE OINT: 1.0000 "application " | TOPICAL_OINTMENT | RECTAL | Status: DC | PRN

## 2017-05-04 MED ORDER — LIDOCAINE HCL (PF) 1 % IJ SOLN
INTRAMUSCULAR | Status: DC | PRN
  Administered 2017-05-03 – 2017-05-04 (×2): 4 mL via EPIDURAL

## 2017-05-04 NOTE — Progress Notes (Signed)
Nicole Mcmahon is a 21 y.o. G1P0 at 3319w3d admitted for induction of labor [facility]  Subjective: She is resting comfortably in her bed asleep.  Objective: BP 139/83   Pulse 92   Temp 98.7 F (37.1 C) (Oral)   Resp 18   Ht 5\' 1"  (1.549 m)   Wt 175 lb (79.4 kg)   LMP 08/08/2016   SpO2 99%   BMI 33.07 kg/m  No intake/output data recorded. No intake/output data recorded.  FHT:  FHR: 130 bpm, variability: moderate,  accelerations:  Present,  decelerations:  Present variable UC:   irregular, every 2-3 minutes SVE:   Dilation: 8 Effacement (%): 90 Station: -1 Exam by:: L.Stubbs, RN  Labs: Lab Results  Component Value Date   WBC 19.9 (H) 05/03/2017   HGB 9.8 (L) 05/03/2017   HCT 29.4 (L) 05/03/2017   MCV 87.5 05/03/2017   PLT 314 05/03/2017    Assessment / Plan: IOL due to gHTN, progressing normally  Labor: Progressing normally and consider AROM Preeclampsia:  n/a Fetal Wellbeing:  Category II Pain Control:  Epidural I/D:  GBS neg Anticipated MOD:  NSVD  Nicole Mcmahon 05/04/2017, 4:48 AM

## 2017-05-04 NOTE — Anesthesia Procedure Notes (Signed)
Epidural Patient location during procedure: OB Start time: 05/04/2017 11:59 PM  Staffing Anesthesiologist: Karna ChristmasELLENDER, Bennetta Rudden P Performed: anesthesiologist   Preanesthetic Checklist Completed: patient identified, site marked, pre-op evaluation, timeout performed, IV checked, risks and benefits discussed and monitors and equipment checked  Epidural Patient position: sitting Prep: DuraPrep Patient monitoring: heart rate, cardiac monitor, continuous pulse ox and blood pressure Approach: midline Location: L4-L5 Injection technique: LOR air  Needle:  Needle type: Tuohy  Needle gauge: 17 G Needle length: 9 cm Needle insertion depth: 6 cm Catheter type: closed end flexible Catheter size: 19 Gauge Catheter at skin depth: 11 cm Test dose: negative and Other  Assessment Events: blood not aspirated, injection not painful, no injection resistance and negative IV test  Additional Notes Informed consent obtained prior to proceeding including risk of failure, 1% risk of PDPH, risk of minor discomfort and bruising.  Discussed rare but serious complications. Discussed alternatives to epidural analgesia and patient desires to proceed.  Timeout performed pre-procedure verifying patient name, procedure, and platelet count.  Patient tolerated procedure well. Reason for block:procedure for pain

## 2017-05-04 NOTE — Progress Notes (Signed)
Nicole Mcmahon is a 21 y.o. G1P0 at 3226w3d admitted for [facility]  Subjective: She is resting comfortably in her bed. Starting to feel more pressure  Objective: BP (!) 145/74   Pulse 99   Temp 98.6 F (37 C) (Oral)   Resp 16   Ht 5\' 1"  (1.549 m)   Wt 79.4 kg (175 lb)   LMP 08/08/2016   SpO2 99%   BMI 33.07 kg/m  I/O last 3 completed shifts: In: -  Out: 600 [Urine:600] No intake/output data recorded.  FHT:  FHR: 150 bpm, variability: moderate,  accelerations:  Present,  decelerations:  Present variables UC:   Regular every 2-4 minutes SVE:   Dilation: 8.5 Effacement (%): 90 Station: +1 Exam by:: Dr. Doroteo GlassmanPhelps, DO  Labs: Lab Results  Component Value Date   WBC 19.9 (H) 05/03/2017   HGB 9.8 (L) 05/03/2017   HCT 29.4 (L) 05/03/2017   MCV 87.5 05/03/2017   PLT 314 05/03/2017    Assessment / Plan: IOL due to gHTN, progressing normally  Labor: Progressing normally, Placed an IUPC to monitor contraction pattern better; augmentation as needed. May need amnioinfusion for variables GHTN: elevated BPs; but no severe range, no PIH symptoms, PIH labs wnl Fetal Wellbeing:  Category II Pain Control:  Epidural I/D:  GBS neg Anticipated MOD:  NSVD  Caryl AdaJazma Phelps, DO 05/04/2017, 9:27 AM

## 2017-05-04 NOTE — Lactation Note (Signed)
This note was copied from a baby's chart. Lactation Consultation Note  Patient Name: Nicole Mcmahon ZOXWR'UToday's Date: 05/04/2017 Reason for consult: Initial assessment;1st time breastfeeding;Primapara;Early term 2337-38.6wks  Visited with 1st time Mom, baby 7 hrs old.  Mom reports baby latched after birth for 10 mins.  Describes a deep areolar latch, no discomfort.  Talked with Mom about importance of keeping baby STS as much as possible.  Assisted with positioning after trying to awaken baby.  Baby not interested in feeding right now.  Demonstrated hand expression and breast massage.  Unable to obtain any colostrum to spoon feed.   Encouraged to continue with breast massage and hand expression often. Basics of breastfeeding reviewed. Breastfeeding brochure left in room.  Mom aware of OP lactation services available to her. Mom to call for assistance as needed.  Maternal Data Formula Feeding for Exclusion: No Has patient been taught Hand Expression?: Yes Does the patient have breastfeeding experience prior to this delivery?: No  Feeding Feeding Type: Breast Milk Length of feed: 0 min  LATCH Score Latch: Too sleepy or reluctant, no latch achieved, no sucking elicited.  Audible Swallowing: None  Type of Nipple: Everted at rest and after stimulation  Comfort (Breast/Nipple): Soft / non-tender  Hold (Positioning): Full assist, staff holds infant at breast  LATCH Score: 4  Interventions Interventions: Breast feeding basics reviewed;Assisted with latch;Skin to skin;Breast massage;Hand express;Breast compression;Adjust position;Support pillows;Position options   Consult Status Consult Status: Follow-up Date: 05/05/17 Follow-up type: In-patient    Nicole Mcmahon, Nicole Mcmahon 05/04/2017, 8:43 PM

## 2017-05-04 NOTE — Progress Notes (Signed)
Pt stated pain was not controlled with IV pain med x3 nor Nitrous (attempt for 20 minutes). Epidural requested and placed. Patient verbalized and facial expression appears more comfortable.

## 2017-05-04 NOTE — Anesthesia Preprocedure Evaluation (Addendum)
Anesthesia Evaluation  Patient identified by MRN, date of birth, ID band Patient awake    Reviewed: Allergy & Precautions, H&P , NPO status , Patient's Chart, lab work & pertinent test results  History of Anesthesia Complications Negative for: history of anesthetic complications  Airway Mallampati: II  TM Distance: >3 FB Neck ROM: full    Dental no notable dental hx. (+) Teeth Intact   Pulmonary asthma ,    Pulmonary exam normal breath sounds clear to auscultation       Cardiovascular hypertension (gestational ), Normal cardiovascular exam Rhythm:regular Rate:Normal     Neuro/Psych negative neurological ROS  negative psych ROS   GI/Hepatic negative GI ROS, Neg liver ROS,   Endo/Other  negative endocrine ROS  Renal/GU negative Renal ROS  negative genitourinary   Musculoskeletal   Abdominal (+) + obese,   Peds  Hematology  (+) Sickle cell trait and anemia ,   Anesthesia Other Findings   Reproductive/Obstetrics (+) Pregnancy                            Anesthesia Physical Anesthesia Plan  ASA: II  Anesthesia Plan: Epidural   Post-op Pain Management:    Induction:   PONV Risk Score and Plan:   Airway Management Planned:   Additional Equipment:   Intra-op Plan:   Post-operative Plan:   Informed Consent: I have reviewed the patients History and Physical, chart, labs and discussed the procedure including the risks, benefits and alternatives for the proposed anesthesia with the patient or authorized representative who has indicated his/her understanding and acceptance.     Plan Discussed with:   Anesthesia Plan Comments:         Anesthesia Quick Evaluation

## 2017-05-04 NOTE — Progress Notes (Signed)
Vitals:   05/04/17 0019 05/04/17 0020  BP:  131/75  Pulse:  95  Resp:    Temp:    SpO2: 98%    135/81; 146/80; 146/84  Getting epidural.  11/04/68/-2 FHR Cat 1, ctx q 3-4 minutes Not on pitocin, doing well. Continue present management

## 2017-05-04 NOTE — Progress Notes (Signed)
Epidural catheter removed at 1528.  Tip intact.  Line not documented on placement.

## 2017-05-05 NOTE — Plan of Care (Signed)
Problem: Activity: Goal: Will verbalize the importance of balancing activity with adequate rest periods Outcome: Completed/Met Date Met: 05/05/17 Patient is up ambulating. Rest periods encouraged.

## 2017-05-05 NOTE — Anesthesia Postprocedure Evaluation (Signed)
Anesthesia Post Note  Patient: [place] [employer]  Procedure(s) Performed: AN AD HOC LABOR EPIDURAL     Patient location during evaluation: Mother Baby Anesthesia Type: Epidural Level of consciousness: awake and alert and oriented Pain management: satisfactory to patient Vital Signs Assessment: post-procedure vital signs reviewed and stable Respiratory status: respiratory function stable Cardiovascular status: stable Postop Assessment: no headache, no backache, epidural receding, patient able to bend at knees, no signs of nausea or vomiting and adequate PO intake Anesthetic complications: no    Last Vitals:  Vitals:   05/04/17 2053 05/05/17 0500  BP: 127/64 135/66  Pulse: 92 90  Resp: 18 18  Temp: 37.3 C 36.8 C  SpO2:      Last Pain:  Vitals:   05/05/17 0533  TempSrc:   PainSc: Asleep   Pain Goal: Patients Stated Pain Goal: 0 (05/04/17 0400)               Sergio Hobart

## 2017-05-05 NOTE — Progress Notes (Signed)
Labor Progress Note Nicole Mcmahon is a 21 y.o. G1P1001 at 6135w3d admitted for IOL due to gHTN  S: Patient is resting comfortably in bed. States vaginal bleeding has been less and she has not passed any clots. Denies any headache, blurry vision, RUQ pain or leg swelling.  O:  BP 135/66 (BP Location: Left Arm)   Pulse 90   Temp 98.2 F (36.8 C) (Oral)   Resp 18   Ht 5\' 1"  (1.549 m)   Wt 79.4 kg (175 lb)   LMP 08/08/2016   SpO2 99%   Breastfeeding, Condoms   BMI 33.07 kg/m    CVE: Dilation:  (svd) Dilation Complete Date: 05/04/17 Dilation Complete Time: 1310 Effacement (%): 100 Cervical Position: Middle Station: +2 Presentation: Vertex Exam by:: dr Doroteo Glassmanphelps   A&P: 21 y.o. G1P1001 6335w3d admitted for IOL for high-risk pregnancy complicated by gHTN. Pt is PPD#1. Pain is well-controlled. BP is stable (135/66) Plan to breast feed baby and use condom for contraception. Pt will be staying until tomorrow. #Pain: No reported pain #GBS negative  Steffanie RainwaterProsper M Amponsah, Medical Student 7:04 AM  I confirm that I have verified the information documented in the medical student's note and that I have also personally reperformed the physical exam and all medical decision making activities.  Rolm BookbinderCaroline M Dayvin Aber, CNM 05/05/17 1:54 PM

## 2017-05-05 NOTE — Plan of Care (Signed)
Problem: Role Relationship: Goal: Ability to demonstrate positive interaction with newborn will improve Outcome: Completed/Met Date Met: 05/05/17 Patient is bonding well with newborn.

## 2017-05-05 NOTE — Lactation Note (Signed)
This note was copied from a baby's chart. Lactation Consultation Note  Patient Name: Nicole Dayton MartesKabrera Kaley YNWGN'FToday's Date: 05/05/2017 Reason for consult: Follow-up assessment;1st time breastfeeding;Primapara;Term;Other (Comment) (+ DAT) Baby is now 5120 hours old.  Baby latched once initially after birth but sleepy since.  Baby in a quiet awake state.  Positioned baby skin to skin in football hold.  Hand expression done with a drop of colostrum expressed.  Baby showing no interest in feeding.  No rooting or opening mouth. Baby given gloved finger to elicit suck but only biting on finger  [facility] DEBP set up and initiated.No milk obtained with pumping.  Discussed with mom giving a small amount of formula due to +DAT and poor feeding.  Baby took 10 mls of Lucien MonsGerber Good Start by dropper.  Tolerated well.  [facility]  Mom instructed to attempt latch with cues, pump breasts every 3 hours and supplement with 10 mls of expressed milk/formula if not latching well.  Mom agreeable.  Maternal Data Has patient been taught Hand Expression?: Yes Does the patient have breastfeeding experience prior to this delivery?: No  Feeding Feeding Type: Breast Fed  LATCH Score Latch: Too sleepy or reluctant, no latch achieved, no sucking elicited.  Audible Swallowing: None  Type of Nipple: Everted at rest and after stimulation  Comfort (Breast/Nipple): Soft / non-tender  Hold (Positioning): Assistance needed to correctly position infant at breast and maintain latch.  LATCH Score: 5  Interventions Interventions: Breast feeding basics reviewed;Assisted with latch;Breast compression;Adjust position;Support pillows;Skin to skin;Breast massage;Position options;Hand express;DEBP  Lactation Tools Discussed/Used     Consult Status Consult Status: Follow-up Date: 05/05/17 Follow-up type: In-patient    Huston FoleyMOULDEN, Josias Tomerlin S 05/05/2017, 10:27 AM

## 2017-05-06 MED ORDER — SENNOSIDES-DOCUSATE SODIUM 8.6-50 MG PO TABS: 2.0000 | ORAL_TABLET | ORAL | 0 refills | Status: DC

## 2017-05-06 MED ORDER — MEASLES, MUMPS & RUBELLA VAC ~~LOC~~ INJ
0.5000 mL | INJECTION | Freq: Once | SUBCUTANEOUS | Status: DC
  Filled 2017-05-06: qty 0.5

## 2017-05-06 MED ORDER — IBUPROFEN 600 MG PO TABS: 600.0000 mg | ORAL_TABLET | Freq: Four times a day (QID) | ORAL | 0 refills | Status: DC

## 2017-05-06 NOTE — Discharge Summary (Signed)
OB Discharge Summary     Patient Name: Nicole Mcmahon DOB: 09-10-1995 MRN: [id number]  Date of admission: 05/03/2017 Delivering MD: Ovidio Kin   Date of discharge: 05/06/2017  Admitting diagnosis: INDUCTION Intrauterine pregnancy: [redacted]w[redacted]d    Secondary diagnosis:  Active Problems:   Supervision of high-risk pregnancy   Gestational hypertension, third trimester   SVD (spontaneous vaginal delivery)  Additional problems: Rubella non-immune, Varicella non-immune     Discharge diagnosis: Term Pregnancy Delivered                                                                                                Post partum procedures:MMR  Augmentation: Cytotec and Foley Balloon  Complications: None  Hospital course:  Induction of Labor With Vaginal Delivery   21y.o. yo G1P1001 at 340w3das admitted to the hospital 05/03/2017 for induction of labor.  Indication for induction: Gestational hypertension.  Patient had an uncomplicated labor course as follows: Membrane Rupture Time/Date: 7:50 PM ,05/03/2017   Intrapartum Procedures: Episiotomy: None [1]                                         Lacerations:  None [1]  Patient had delivery of a Viable infant.  Information for the patient's newborn:  Nicole Mcmahon, Vanblarcom0[[id number]]Delivery Method: Vaginal, Spontaneous Delivery(Filed from Delivery Summary)   05/04/2017  Details of delivery can be found in separate delivery note.  Patient had a routine postpartum course. BPs remained stable. Will have 1 week BP check. Patient is discharged home 05/06/17.  Physical exam  Vitals:   05/04/17 1550 05/04/17 1650 05/04/17 2053 05/05/17 0500  BP: (!) 146/74 140/83 127/64 135/66  Pulse: 77 83 92 90  Resp: 17 16 18 18   Temp: 99.2 F (37.3 C) 98.9 F (37.2 C) 99.2 F (37.3 C) 98.2 F (36.8 C)  TempSrc: Oral Oral Oral Oral  SpO2:      Weight:      Height:       General: alert, cooperative and no distress Lochia: appropriate Uterine  Fundus: firm Incision: N/A DVT Evaluation: No evidence of DVT seen on physical exam. Labs: Lab Results  Component Value Date   WBC 19.9 (H) 05/03/2017   HGB 9.8 (L) 05/03/2017   HCT 29.4 (L) 05/03/2017   MCV 87.5 05/03/2017   PLT 314 05/03/2017   CMP Latest Ref Rng & Units 05/03/2017  Glucose 65 - 99 mg/dL 82  BUN 6 - 20 mg/dL <5(L)  Creatinine 0.44 - 1.00 mg/dL 0.64  Sodium 135 - 145 mmol/L 136  Potassium 3.5 - 5.1 mmol/L 4.0  Chloride 101 - 111 mmol/L 107  CO2 22 - 32 mmol/L 21(L)  Calcium 8.9 - 10.3 mg/dL 9.4  Total Protein 6.5 - 8.1 g/dL 7.0  Total Bilirubin 0.3 - 1.2 mg/dL 0.6  Alkaline Phos 38 - 126 U/L 195(H)  AST 15 - 41 U/L 24  ALT 14 - 54 U/L 14    Discharge instruction: per After Visit Summary  and "Baby and Me Booklet".  After visit meds:  Allergies as of 05/06/2017   No Known Allergies     Medication List    STOP taking these medications   ferrous sulfate 325 (65 FE) MG tablet Commonly known as:  FERROUSUL     TAKE these medications   CITRANATAL 90 DHA 90-1 & 300 MG Misc Take 1 tablet by mouth daily.   ibuprofen 600 MG tablet Commonly known as:  ADVIL,MOTRIN Take 1 tablet (600 mg total) every 6 (six) hours by mouth.   ranitidine 150 MG tablet Commonly known as:  ZANTAC Take 1 tablet (150 mg total) by mouth 2 (two) times daily.   senna-docusate 8.6-50 MG tablet Commonly known as:  Senokot-S Take 2 tablets daily by mouth. Start taking on:  05/07/2017       Diet: routine diet  Activity: Advance as tolerated. Pelvic rest for 6 weeks.   Outpatient follow up:4 weeks Follow up Appt: Future Appointments  Date Time Provider [place]  05/10/2017  3:45 PM [facility] NURSE [facility]  06/01/2017 10:15 AM Anyanwu, Sallyanne Havers, MD [facility]   Follow up Visit:No Follow-up on file.  Postpartum contraception: Condoms  Newborn Data: Live born female  Birth Weight: 6 lb 3 oz (2807 g) APGAR: 9, 9  Newborn Delivery   Birth  date/time:  05/04/2017 13:42:00 Delivery type:  Vaginal, Spontaneous     Baby Feeding: Breast Disposition:home with mother   05/06/2017 Luiz Blare, DO

## 2017-05-06 NOTE — Lactation Note (Addendum)
This note was copied from a baby's chart. Lactation Consultation Note  Patient Name: Nicole Mcmahon YNWGN'F Date: 05/06/2017 Reason for consult: Follow-up assessment   P1, Baby 56 hours old.  +DAT [redacted]w[redacted]d< 6 lbs. Baby has been having difficulty latching and mother needed assistance. Mother and father were worried that mother did not have enough breastmilk to feed her baby and her baby has been sleepy, not opening wide for feedings. Reviewed hand expression. Mother's breasts felt full and she was easily able to express flow.  Drops were given to baby with cup. Assisted w/ latching but baby was unable to sustain latch and maintain depth. He unlatched after a few minutes. Applied #20NS and prefilled w/ formula and baby was able to sustain latch for 20 min on R side.  Sucks and swallows observed. Applied #24NS to L side.  Had mother hand express and prefill nipple shield with colostrum.  Baby latched and sustained latch on L side with sucks and swallows. Mother does not have DEBP at home.  [facility]  Demonstrated how to turn DEBP kit to double manual pump and gave mother manual single pump. Also put in basket message request for OP appointment. Provided family with LPI information sheet and suggest following volume guidelines. If baby is latching mother is to post pump with double manual pump for 10-20 min every other feeding for a total of approx 5-6 times per day. If baby is not latching mother is post pump q 3hours. Mom made aware of O/P services, breastfeeding support groups, community resources, and our phone # for post-discharge questions.  Reviewed engorgement care and monitoring voids/stools. [facility]     Maternal Data Has patient been taught Hand Expression?: Yes Does the patient have breastfeeding experience prior to this delivery?: No  Feeding Feeding Type: Breast Fed Length of feed: 30 min  LATCH Score Latch:  Repeated attempts needed to sustain latch, nipple held in mouth throughout feeding, stimulation needed to elicit sucking reflex.  Audible Swallowing: A few with stimulation  Type of Nipple: Everted at rest and after stimulation  Comfort (Breast/Nipple): Soft / non-tender  Hold (Positioning): Assistance needed to correctly position infant at breast and maintain latch.  LATCH Score: 7  Interventions Interventions: Breast feeding basics reviewed;Assisted with latch;Skin to skin;Breast massage;Hand express;Pre-pump if needed;Breast compression;Adjust position;Support pillows;Position options;Expressed milk;Hand pump;DEBP  Lactation Tools Discussed/Used Tools: Pump;Nipple Shields Nipple shield size: 20;24 Breast pump type: Double-Electric Breast Pump Pump Review: Milk Storage   Consult Status Consult Status: Follow-up Follow-up type: Out-patient    BVivianne MasterBMclean Southeast11/10/2016, 12:40 PM

## 2017-05-06 NOTE — Discharge Instructions (Signed)

## 2017-05-10 ENCOUNTER — Inpatient Hospital Stay (HOSPITAL_COMMUNITY): Payer: Medicaid Other

## 2017-05-10 ENCOUNTER — Other Ambulatory Visit: Payer: Self-pay

## 2017-05-10 ENCOUNTER — Ambulatory Visit: Payer: Medicaid Other

## 2017-05-10 ENCOUNTER — Encounter (HOSPITAL_COMMUNITY): Payer: Self-pay | Admitting: Anesthesiology

## 2017-05-10 ENCOUNTER — Encounter (HOSPITAL_COMMUNITY): Payer: Self-pay

## 2017-05-10 ENCOUNTER — Encounter: Payer: Self-pay | Admitting: Radiology

## 2017-05-10 ENCOUNTER — Inpatient Hospital Stay (HOSPITAL_COMMUNITY)
Admission: AD | Admit: 2017-05-10 | Discharge: 2017-05-12 | DRG: 776 | Disposition: A | Discharge status: Home / Self Care | Payer: Medicaid Other | Source: Ambulatory Visit | Attending: Obstetrics and Gynecology | Admitting: Obstetrics and Gynecology

## 2017-05-10 VITALS — BP 161/109 | HR 78

## 2017-05-10 DIAGNOSIS — O1495 Unspecified pre-eclampsia, complicating the puerperium: Secondary | ICD-10-CM | POA: Diagnosis not present

## 2017-05-10 DIAGNOSIS — O1415 Severe pre-eclampsia, complicating the puerperium: Principal | ICD-10-CM | POA: Diagnosis present

## 2017-05-10 DIAGNOSIS — O1490 Unspecified pre-eclampsia, unspecified trimester: Secondary | ICD-10-CM

## 2017-05-10 DIAGNOSIS — Z0130 Encounter for examination of blood pressure without abnormal findings: Secondary | ICD-10-CM

## 2017-05-10 DIAGNOSIS — R079 Chest pain, unspecified: Secondary | ICD-10-CM | POA: Diagnosis not present

## 2017-05-10 DIAGNOSIS — R0602 Shortness of breath: Secondary | ICD-10-CM | POA: Diagnosis not present

## 2017-05-10 DIAGNOSIS — R0600 Dyspnea, unspecified: Secondary | ICD-10-CM | POA: Diagnosis not present

## 2017-05-10 DIAGNOSIS — O09299 Supervision of pregnancy with other poor reproductive or obstetric history, unspecified trimester: Secondary | ICD-10-CM | POA: Diagnosis present

## 2017-05-10 HISTORY — DX: Unspecified pre-eclampsia, unspecified trimester: O14.90

## 2017-05-10 HISTORY — DX: Unspecified pre-eclampsia, complicating the puerperium: O14.95

## 2017-05-10 LAB — CBC
HEMATOCRIT: 29.9 % — AB (ref 36.0–46.0)
HEMOGLOBIN: 9.6 g/dL — AB (ref 12.0–15.0)
MCH: 28.2 pg (ref 26.0–34.0)
MCHC: 32.1 g/dL (ref 30.0–36.0)
MCV: 87.7 fL (ref 78.0–100.0)
Platelets: 482 10*3/uL — ABNORMAL HIGH (ref 150–400)
RBC: 3.41 MIL/uL — AB (ref 3.87–5.11)
RDW: 15.4 % (ref 11.5–15.5)
WBC: 16.8 10*3/uL — ABNORMAL HIGH (ref 4.0–10.5)

## 2017-05-10 LAB — COMPREHENSIVE METABOLIC PANEL
ALBUMIN: 3.3 g/dL — AB (ref 3.5–5.0)
ALK PHOS: 125 U/L (ref 38–126)
ALT: 20 U/L (ref 14–54)
AST: 40 U/L (ref 15–41)
Anion gap: 9 (ref 5–15)
BILIRUBIN TOTAL: 0.9 mg/dL (ref 0.3–1.2)
BUN: 11 mg/dL (ref 6–20)
CALCIUM: 8.8 mg/dL — AB (ref 8.9–10.3)
CO2: 20 mmol/L — AB (ref 22–32)
CREATININE: 0.72 mg/dL (ref 0.44–1.00)
Chloride: 107 mmol/L (ref 101–111)
GFR calc Af Amer: >60 mL/min (ref >60)
GFR calc non Af Amer: >60 mL/min (ref >60)
GLUCOSE: 80 mg/dL (ref 65–99)
Potassium: 4.5 mmol/L (ref 3.5–5.1)
SODIUM: 136 mmol/L (ref 135–145)
TOTAL PROTEIN: 6.5 g/dL (ref 6.5–8.1)

## 2017-05-10 LAB — PROTEIN / CREATININE RATIO, URINE
Creatinine, Urine: 135 mg/dL
PROTEIN CREATININE RATIO: 0.92 mg/mg{creat} — AB (ref 0.00–0.15)
Total Protein, Urine: 124 mg/dL

## 2017-05-10 MED ORDER — MAGNESIUM SULFATE 40 G IN LACTATED RINGERS - SIMPLE
2.0000 g/h | INTRAVENOUS | Status: DC
  Administered 2017-05-10 – 2017-05-11 (×2): 2 g/h via INTRAVENOUS
  Filled 2017-05-10: qty 40
  Filled 2017-05-10: qty 500

## 2017-05-10 MED ORDER — LABETALOL HCL 5 MG/ML IV SOLN: 40.0000 mg | Freq: Once | INTRAVENOUS | Status: DC | PRN

## 2017-05-10 MED ORDER — LABETALOL HCL 5 MG/ML IV SOLN: 20.0000 mg | INTRAVENOUS | Status: DC | PRN

## 2017-05-10 MED ORDER — HYDRALAZINE HCL 20 MG/ML IJ SOLN: 5.0000 mg | INTRAMUSCULAR | Status: DC | PRN

## 2017-05-10 MED ORDER — MAGNESIUM SULFATE BOLUS VIA INFUSION
4.0000 g | Freq: Once | INTRAVENOUS | Status: AC
  Administered 2017-05-10: 4 g via INTRAVENOUS
  Filled 2017-05-10: qty 500

## 2017-05-10 MED ORDER — BUTALBITAL-APAP-CAFFEINE 50-325-40 MG PO TABS
1.0000 | ORAL_TABLET | ORAL | Status: DC | PRN
Start: 2017-05-10 — End: 2017-05-12

## 2017-05-10 MED ORDER — SIMETHICONE 80 MG PO CHEW: 80.0000 mg | CHEWABLE_TABLET | Freq: Four times a day (QID) | ORAL | Status: DC | PRN

## 2017-05-10 MED ORDER — CALCIUM CARBONATE ANTACID 500 MG PO CHEW
1.0000 | CHEWABLE_TABLET | Freq: Two times a day (BID) | ORAL | Status: DC
  Administered 2017-05-10 – 2017-05-12 (×4): 200 mg via ORAL
  Filled 2017-05-10 (×4): qty 1

## 2017-05-10 MED ORDER — ACETAMINOPHEN 325 MG PO TABS: 650.0000 mg | ORAL_TABLET | Freq: Four times a day (QID) | ORAL | Status: DC | PRN

## 2017-05-10 MED ORDER — HYDRALAZINE HCL 20 MG/ML IJ SOLN
5.0000 mg | INTRAMUSCULAR | Status: AC | PRN
  Administered 2017-05-10: 10 mg via INTRAVENOUS
  Administered 2017-05-10: 5 mg via INTRAVENOUS
  Filled 2017-05-10 (×2): qty 1

## 2017-05-10 MED ORDER — NIFEDIPINE 10 MG PO CAPS: 10.0000 mg | ORAL_CAPSULE | ORAL | Status: DC | PRN

## 2017-05-10 MED ORDER — LACTATED RINGERS IV SOLN
INTRAVENOUS | Status: DC
  Administered 2017-05-10: 125 mL/h via INTRAVENOUS
  Administered 2017-05-11 (×2): via INTRAVENOUS

## 2017-05-10 NOTE — Progress Notes (Signed)
Faculty Note  BP improved with MgSO4 admin, will start PO antihypertensives if she remains elevated. Still with headache. No improvement with SOB and chest heaviness.   BP 132/70   Pulse (!) 107   Temp 98.3 F (36.8 C) (Oral)   Resp 18   Ht 5\' 1"  (1.549 m)   Wt 166 lb (75.3 kg)   SpO2 97%   BMI 31.37 kg/m  Gen: alert, oriented, fatigued  CLINICAL DATA:  Dyspnea and heaviness in the center chest and left chest since yesterday. Elevated blood pressure and headaches. Postpartum since 05/04/2017.  EXAM: PORTABLE CHEST 1 VIEW  COMPARISON:  None.  FINDINGS: Borderline heart size and pulmonary vascularity. There is a diffuse interstitial pattern to the lungs in a perihilar distribution with peribronchial thickening. This likely represents small airways disease. No focal consolidation. No blunting of costophrenic angles. No pneumothorax. Mediastinal contours appear intact.  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 No focal consolidation.   Electronically Signed   By: Burman NievesWilliam  Stevens M.D.   On: 05/10/2017 21:09  Reviewed EKG with Dr. Otho Bellowsetterding via [place], essentially normal. CXR with no acute abnormalities. Given tachycardia, cough and SOB, concern for pulmonary embolism vs peripartum cardiomyopathy. Reviewed risks/benefits of CTA with patient and husband, they are agreeable. Will also send BNP.    Baldemar LenisK. Meryl Fumio Vandam, M.D. Attending Obstetrician & Gynecologist, [facility], [facility]

## 2017-05-10 NOTE — Progress Notes (Signed)
Patient presented to the office today for blood pressure check. Patient reports having a pounding right side headache for the past couple of days, swelling in her feet at this time. She also stated she felt short of breath earlier today.She reports not taking any medications at this time.  Called the on call provider  Dr.Davis she recommends patient come over to the MAU asap to be exam. Patient verbalizes understanding at this time. [facility]  Called MAU spoke with charge nurse gave her report.

## 2017-05-10 NOTE — MAU Provider Note (Signed)
History     CSN: [id number]  Arrival date and time: 05/10/17 1714   First Provider Initiated Contact with Patient 05/10/17 1813      Chief Complaint  Patient presents with  . Hypertension  . Headache   HPI  Ms. Nicole Mcmahon is a 21 y.o. G1P1001 6 days PP [facility] with elevated BP. She was induced for gHTN and had elevated BPs while here in labor. She denies H/A, epigastric pain, blurry vision.  She endorses some swelling in lower extremities  Past Medical History:  Diagnosis Date  . Asthma   . Blood dyscrasia   . H/O genital injury    Childhood injury where she jumped and landed on a stick that went into her vagina.   . Pregnancy induced hypertension   . Sickle cell trait (HCC)     History reviewed. No pertinent surgical history.  History reviewed. No pertinent family history.  Social History   Tobacco Use  . Smoking status: Never Smoker  . Smokeless tobacco: Never Used  Substance Use Topics  . Alcohol use: No  . Drug use: No    Allergies: No Known Allergies  Medications Prior to Admission  Medication Sig Dispense Refill Last Dose  . ferrous sulfate 325 (65 FE) MG tablet Take 325 mg daily with breakfast by mouth.   05/10/2017 at Unknown time  . ibuprofen (ADVIL,MOTRIN) 600 MG tablet Take 1 tablet (600 mg total) every 6 (six) hours by mouth. (Patient not taking: Reported on 05/10/2017) 30 tablet 0 Not Taking  . Prenat w/o A-FeCbGl-DSS-FA-DHA (CITRANATAL 90 DHA) 90-1 & 300 MG MISC Take 1 tablet by mouth daily. (Patient not taking: Reported on 05/03/2017) 30 each 12 Not Taking  . ranitidine (ZANTAC) 150 MG tablet Take 1 tablet (150 mg total) by mouth 2 (two) times daily. (Patient not taking: Reported on 05/10/2017) 60 tablet 11 Not Taking  . senna-docusate (SENOKOT-S) 8.6-50 MG tablet Take 2 tablets daily by mouth. (Patient not taking: Reported on 05/10/2017) 30 tablet 0 Not Taking    Review of Systems  Constitutional: Negative.   HENT:  Negative.   Eyes: Negative.   Respiratory: Positive for shortness of breath.   Cardiovascular: Positive for leg swelling.  Gastrointestinal: Negative.   Endocrine: Negative.   Genitourinary: Positive for vaginal bleeding.  Musculoskeletal: Negative.   Skin: Negative.   Allergic/Immunologic: Negative.   Neurological: Negative.   Hematological: Negative.   Psychiatric/Behavioral: Negative.    Physical Exam   Blood pressure (!) 166/117, pulse 64, temperature 98.5 F (36.9 C), resp. rate 18, height 5\' 1"  (1.549 m), weight 166 lb (75.3 kg), currently breastfeeding.  Patient Vitals for the past 24 hrs:  BP Temp Pulse Resp Height Weight  05/10/17 1921 (!) 149/80 - (!) 111 - - -  05/10/17 1901 (!) 176/104 - 72 - - -  05/10/17 1840 (!) 176/107 - 63 - - -  05/10/17 1838 (!) 195/107 - - - - -  05/10/17 1820 (!) 195/107 - 70 - - -  05/10/17 1818 (!) 206/114 - 69 - - -  05/10/17 1800 (!) 166/117 - 64 - - -  05/10/17 1748 (!) 179/105 98.5 F (36.9 C) 67 18 5\' 1"  (1.549 m) 166 lb (75.3 kg)     Physical Exam  Nursing note and vitals reviewed. Constitutional: She is oriented to person, place, and time. She appears well-developed and well-nourished.  HENT:  Head: Normocephalic.  Eyes: Pupils are equal, round, and reactive  to light.  Neck: Normal range of motion.  Cardiovascular: Normal rate, regular rhythm, normal heart sounds and intact distal pulses.  Respiratory: Effort normal and breath sounds normal.  GI: Soft. Bowel sounds are normal.  Musculoskeletal: Normal range of motion.  Neurological: She is alert and oriented to person, place, and time.  DTRs 2+ brisk  Skin: Skin is warm and dry.  Psychiatric: She has a normal mood and affect. Her behavior is normal. Judgment and thought content normal.    MAU Course  Procedures  MDM  Results for orders placed or performed during the hospital encounter of 05/10/17 (from the past 24 hour(s))  Protein / creatinine ratio, urine      Status: Abnormal   Collection Time: 05/10/17  6:05 PM  Result Value Ref Range   Creatinine, Urine 135.00 mg/dL   Total Protein, Urine 124 mg/dL   Protein Creatinine Ratio 0.92 (H) 0.00 - 0.15 mg/mg[Cre]  CBC     Status: Abnormal   Collection Time: 05/10/17  6:25 PM  Result Value Ref Range   WBC 16.8 (H) 4.0 - 10.5 K/uL   RBC 3.41 (L) 3.87 - 5.11 MIL/uL   Hemoglobin 9.6 (L) 12.0 - 15.0 g/dL   HCT 45.429.9 (L) 09.836.0 - 11.946.0 %   MCV 87.7 78.0 - 100.0 fL   MCH 28.2 26.0 - 34.0 pg   MCHC 32.1 30.0 - 36.0 g/dL   RDW 14.715.4 82.911.5 - 56.215.5 %   Platelets 482 (H) 150 - 400 K/uL  Comprehensive metabolic panel     Status: Abnormal   Collection Time: 05/10/17  6:25 PM  Result Value Ref Range   Sodium 136 135 - 145 mmol/L   Potassium 4.5 3.5 - 5.1 mmol/L   Chloride 107 101 - 111 mmol/L   CO2 20 (L) 22 - 32 mmol/L   Glucose, Bld 80 65 - 99 mg/dL   BUN 11 6 - 20 mg/dL   Creatinine, Ser 1.300.72 0.44 - 1.00 mg/dL   Calcium 8.8 (L) 8.9 - 10.3 mg/dL   Total Protein 6.5 6.5 - 8.1 g/dL   Albumin 3.3 (L) 3.5 - 5.0 g/dL   AST 40 15 - 41 U/L   ALT 20 14 - 54 U/L   Alkaline Phosphatase 125 38 - 126 U/L   Total Bilirubin 0.9 0.3 - 1.2 mg/dL   GFR calc non Af Amer >60 >60 mL/min   GFR calc Af Amer >60 >60 mL/min   Anion gap 9 5 - 15   *Consult with Dr. Earlene Plateravis @ 1905 - notified of patient's complaints, assessments, lab  results, recommend admission for MgSO4   Assessment and Plan  Preeclampsia in postpartum period - Admit to HR OB unit - Start Magnesium Sulfate 4 gm loading dose; then 2 gm every hour - See Dr. Earlene Plateravis' admission orders - Dr. Earlene Plateravis assumes care of patient upon admission   Nicole Moraolitta Rashed Edler, MSN, CNM 05/10/2017, 6:18 PM

## 2017-05-10 NOTE — H&P (Addendum)
Obstetric History and Physical  Nicole Mcmahon is a 21 y.o. G1P1001 PPD#6 s/p SVD after IOL for gestational HTN. She was seen at the office today for BP check and had severe range BP, also complaining of headache. She has had headaches over the last year but this is only since delivery, both frontal and in the back and in lower back, she has had this headache off and on since delivery, did have an epidural. She also reports chest "heaviness" for 2-3 days, particularly at night and is having trouble catching her breath. Also occasionally feels like her heart is racing. Husband has noticed she is not herself since she delivered but he attributed it to her having had a delivery.  Bleeding is minimal, abdominal pain is well controlled. Denies nausea/vomiting, denies bowel/bladder issues. Baby is well and she is breastfeeding.   Her post partum course was uncomplicated. She did not require any anti-hypertensives during her stay with mildly elevated and then normal BP. She was discharged home in good condition on PPD#2  Patient Active Problem List   Diagnosis Date Noted  . Preeclampsia in postpartum period 05/10/2017  . SVD (spontaneous vaginal delivery) 05/04/2017  . Gestational hypertension, third trimester 04/26/2017  . Rubella non-immune status, antepartum 01/22/2017  . Maternal varicella, non-immune 01/22/2017  . Anemia affecting pregnancy, antepartum 01/22/2017  . Supervision of high-risk pregnancy 01/17/2017  . H/O genital injury 01/17/2017   She is breast feeding and using condoms for contraception.  Past Medical History:  Diagnosis Date  . Asthma   . Blood dyscrasia   . H/O genital injury    Childhood injury where she jumped and landed on a stick that went into her vagina.   . Pregnancy induced hypertension   . Sickle cell trait (HCC)     History reviewed. No pertinent surgical history.  OB History  Gravida Para Term Preterm AB Living  1 1 1     1   SAB TAB Ectopic Multiple  Live Births        0 1    # Outcome Date GA Lbr Len/2nd Weight Sex Delivery Anes PTL Lv  1 Term 05/04/17 5030w3d 23:05 / 00:32 6 lb 3 oz (2.807 kg) M Vag-Spont EPI  LIV      Social History   Socioeconomic History  . Marital status: Married    Spouse name: None  . Number of children: None  . Years of education: None  . Highest education level: None  Social Needs  . Financial resource strain: None  . Food insecurity - worry: None  . Food insecurity - inability: None  . Transportation needs - medical: None  . Transportation needs - non-medical: None  Occupational History  . None  Tobacco Use  . Smoking status: Never Smoker  . Smokeless tobacco: Never Used  Substance and Sexual Activity  . Alcohol use: No  . Drug use: No  . Sexual activity: No  Other Topics Concern  . None  Social History Narrative  . None    History reviewed. No pertinent family history.  Medications Prior to Admission  Medication Sig Dispense Refill Last Dose  . ferrous sulfate 325 (65 FE) MG tablet Take 325 mg daily with breakfast by mouth.   05/10/2017 at Unknown time  . ibuprofen (ADVIL,MOTRIN) 600 MG tablet Take 1 tablet (600 mg total) every 6 (six) hours by mouth. (Patient not taking: Reported on 05/10/2017) 30 tablet 0 Not Taking  . Prenat w/o A-FeCbGl-DSS-FA-DHA (CITRANATAL 90 DHA) 90-1 &  300 MG MISC Take 1 tablet by mouth daily. (Patient not taking: Reported on 05/03/2017) 30 each 12 Not Taking  . ranitidine (ZANTAC) 150 MG tablet Take 1 tablet (150 mg total) by mouth 2 (two) times daily. (Patient not taking: Reported on 05/10/2017) 60 tablet 11 Not Taking  . senna-docusate (SENOKOT-S) 8.6-50 MG tablet Take 2 tablets daily by mouth. (Patient not taking: Reported on 05/10/2017) 30 tablet 0 Not Taking    No Known Allergies  Review of Systems: Negative except for what is mentioned in HPI.  Physical Exam: BP (!) 154/93 (BP Location: Left Arm)   Pulse (!) 128   Temp 98.9 F (37.2 C) (Oral)   Resp 18    Ht 5\' 1"  (1.549 m)   Wt 166 lb (75.3 kg)   SpO2 100%   BMI 31.37 kg/m  CONSTITUTIONAL: Well-developed, well-nourished African American female in no acute distress appears fatigued.  HENT:  Normocephalic, atraumatic, External right and left ear normal. Oropharynx is clear and moist EYES: Conjunctivae and EOM are normal. Pupils are equal, round, and reactive to light. No scleral icterus.  NECK: Normal range of motion, supple, no masses SKIN: Skin is warm and dry. No rash noted. Not diaphoretic. No erythema. No pallor. NEUROLOGIC: Alert and oriented to person, place, and time. Normal reflexes, muscle tone coordination. No cranial nerve deficit noted. PSYCHIATRIC: Normal mood and affect. Normal behavior. Normal judgment and thought content. CARDIOVASCULAR: tachycardic heart rate noted, regular rhythm RESPIRATORY: Effort and breath sounds normal across all lobes no problems with respiration noted ABDOMEN: Soft, nontender, nondistended MUSCULOSKELETAL: Normal range of motion. +3 DTR in upper and lower extremities, +1 edema in lower extremities up to mid calf bilaterally, soft, no calf tenderness, negative Homan's sign   Pertinent Labs/Studies:   Results for orders placed or performed during the hospital encounter of 05/10/17 (from the past 24 hour(s))  Protein / creatinine ratio, urine     Status: Abnormal   Collection Time: 05/10/17  6:05 PM  Result Value Ref Range   Creatinine, Urine 135.00 mg/dL   Total Protein, Urine 124 mg/dL   Protein Creatinine Ratio 0.92 (H) 0.00 - 0.15 mg/mg[Cre]  CBC     Status: Abnormal   Collection Time: 05/10/17  6:25 PM  Result Value Ref Range   WBC 16.8 (H) 4.0 - 10.5 K/uL   RBC 3.41 (L) 3.87 - 5.11 MIL/uL   Hemoglobin 9.6 (L) 12.0 - 15.0 g/dL   HCT 69.6 (L) 29.5 - 28.4 %   MCV 87.7 78.0 - 100.0 fL   MCH 28.2 26.0 - 34.0 pg   MCHC 32.1 30.0 - 36.0 g/dL   RDW 13.2 44.0 - 10.2 %   Platelets 482 (H) 150 - 400 K/uL  Comprehensive metabolic panel      Status: Abnormal   Collection Time: 05/10/17  6:25 PM  Result Value Ref Range   Sodium 136 135 - 145 mmol/L   Potassium 4.5 3.5 - 5.1 mmol/L   Chloride 107 101 - 111 mmol/L   CO2 20 (L) 22 - 32 mmol/L   Glucose, Bld 80 65 - 99 mg/dL   BUN 11 6 - 20 mg/dL   Creatinine, Ser 7.25 0.44 - 1.00 mg/dL   Calcium 8.8 (L) 8.9 - 10.3 mg/dL   Total Protein 6.5 6.5 - 8.1 g/dL   Albumin 3.3 (L) 3.5 - 5.0 g/dL   AST 40 15 - 41 U/L   ALT 20 14 - 54 U/L   Alkaline Phosphatase 125 38 -  126 U/L   Total Bilirubin 0.9 0.3 - 1.2 mg/dL   GFR calc non Af Amer >60 >60 mL/min   GFR calc Af Amer >60 >60 mL/min   Anion gap 9 5 - 15    Assessment/Plan:  Nicole Mcmahon is a 21 y.o. G1P1001 at PPD#6 being admitted for post partum pre-eclampsia with severe features versus severe gestational hypertension. UPCr 0.9, but noted to have blood. She has required several IV pushes of labetalol in the MAU. She also complains of chest heaviness. She appears well with clear lungs and O2 sat 100% on room air but is is tachycardic, regular rhythm noted. Will order CXR and EKG. Will admit for 24 hrs MgSO4 and add PO antihypertensives if she requires it.   Regular diet MgSO4 4 gm loading dose then 2 g/hr CXR EKG Admit to 3rd floor   K. Therese SarahMeryl Marlissa Mcmahon, M.D. Attending Obstetrician & Gynecologist, [facility], [facility]  05/10/2017, 8:36 PM

## 2017-05-10 NOTE — MAU Note (Signed)
Pt sent from office for elevated B/p PP 05/04/2017. Pt c/o a throbbing in her head and feeling SOB.

## 2017-05-11 ENCOUNTER — Inpatient Hospital Stay (HOSPITAL_COMMUNITY): Payer: Medicaid Other

## 2017-05-11 ENCOUNTER — Encounter (HOSPITAL_COMMUNITY): Payer: Self-pay

## 2017-05-11 DIAGNOSIS — O1495 Unspecified pre-eclampsia, complicating the puerperium: Secondary | ICD-10-CM

## 2017-05-11 DIAGNOSIS — R079 Chest pain, unspecified: Secondary | ICD-10-CM

## 2017-05-11 LAB — ECHOCARDIOGRAM COMPLETE
HEIGHTINCHES: 61 in
WEIGHTICAEL: 2656 [oz_av]

## 2017-05-11 LAB — BRAIN NATRIURETIC PEPTIDE: B Natriuretic Peptide: 394.3 pg/mL — ABNORMAL HIGH (ref 0.0–100.0)

## 2017-05-11 MED ORDER — FUROSEMIDE 10 MG/ML IJ SOLN
40.0000 mg | Freq: Once | INTRAMUSCULAR | Status: AC
  Administered 2017-05-11: 40 mg via INTRAVENOUS
  Filled 2017-05-11: qty 4

## 2017-05-11 MED ORDER — IOPAMIDOL (ISOVUE-370) INJECTION 76%
100.0000 mL | Freq: Once | INTRAVENOUS | Status: AC | PRN
  Administered 2017-05-11: 100 mL via INTRAVENOUS

## 2017-05-11 MED ORDER — ENALAPRIL MALEATE 10 MG PO TABS
10.0000 mg | ORAL_TABLET | Freq: Every day | ORAL | Status: DC
  Administered 2017-05-11 – 2017-05-12 (×2): 10 mg via ORAL
  Filled 2017-05-11 (×3): qty 1

## 2017-05-11 NOTE — Progress Notes (Signed)
Faculty Practice OB/GYN Attending Note   Subjective:  Patient still reports chest tightness and heaviness. No headaches, visual symptoms, RUQ/epigastric pain.      Objective:  Blood pressure 136/88, pulse 88, temperature 98.3 F (36.8 C), temperature source Oral, resp. rate 18, height 5' 1"  (1.549 m), weight 166 lb (75.3 kg), SpO2 99 %, unknown if currently breastfeeding.  Patient Vitals for the past 24 hrs:  BP Temp Temp src Pulse Resp SpO2 Height Weight  05/11/17 0700 136/88 - - 88 - 99 % - -  05/11/17 0600 (!) 145/91 - - 97 18 100 % - -  05/11/17 0500 134/75 - - 92 18 100 % - -  05/11/17 0400 132/82 - - 94 18 100 % - -  05/11/17 0300 137/88 - - (!) 120 20 - - -  05/11/17 0201 131/73 - - 90 18 - - -  05/11/17 0157 - - - - - 99 % - -  05/11/17 0124 (!) 154/97 - - (!) 101 20 - - -  05/11/17 0001 (!) 145/81 - - (!) 104 18 - - -  05/11/17 0000 - - - - - 99 % - -  05/10/17 2200 - - - - - 97 % - -  05/10/17 2157 132/70 - - (!) 107 18 - - -  05/10/17 2153 - - - - - 97 % - -  05/10/17 2115 (!) 150/87 98.3 F (36.8 C) Oral (!) 102 18 - - -  05/10/17 2113 - - - - - 99 % - -  05/10/17 2106 (!) 152/92 - - 99 20 99 % - -  05/10/17 2054 (!) 156/101 - - (!) 101 20 - - -  05/10/17 1948 (!) 154/93 98.9 F (37.2 C) Oral (!) 128 18 100 % - -  05/10/17 1921 (!) 149/80 - - (!) 111 - - - -  05/10/17 1901 (!) 176/104 - - 72 - - - -  05/10/17 1840 (!) 176/107 - - 63 - - - -  05/10/17 1838 (!) 195/107 - - - - - - -  05/10/17 1820 (!) 195/107 - - 70 - - - -  05/10/17 1818 (!) 206/114 - - 69 - - - -  05/10/17 1800 (!) 166/117 - - 64 - - - -  05/10/17 1748 (!) 179/105 98.5 F (36.9 C) - 67 18 - 5' 1"  (1.549 m) 166 lb (75.3 kg)    Intake/Output Summary (Last 24 hours) at 05/11/2017 1208 Last data filed at 05/11/2017 1035 Gross per 24 hour  Intake 2388.75 ml  Output 4560 ml  Net -2171.25 ml   Gen: NAD HENT: Normocephalic, atraumatic Lungs: Normal respiratory effort Heart: Regular rate  noted Abdomen: NT, soft Cervix: Deferred Ext: 2+ DTRs, no edema, no cyanosis, negative Homan's sign  Results for orders placed or performed during the hospital encounter of 05/10/17 (from the past 48 hour(s))  Brain natriuretic peptide     Status: Abnormal   Collection Time: 05/10/17  6:02 PM  Result Value Ref Range   B Natriuretic Peptide 394.3 (H) 0.0 - 100.0 pg/mL    Comment: Performed at [facility], [address], [place], [place] [address]  Protein / creatinine ratio, urine     Status: Abnormal   Collection Time: 05/10/17  6:05 PM  Result Value Ref Range   Creatinine, Urine 135.00 mg/dL   Total Protein, Urine 124 mg/dL    Comment: NO NORMAL RANGE ESTABLISHED FOR THIS TEST   Protein  Creatinine Ratio 0.92 (H) 0.00 - 0.15 mg/mg[Cre]  CBC     Status: Abnormal   Collection Time: 05/10/17  6:25 PM  Result Value Ref Range   WBC 16.8 (H) 4.0 - 10.5 K/uL   RBC 3.41 (L) 3.87 - 5.11 MIL/uL   Hemoglobin 9.6 (L) 12.0 - 15.0 g/dL   HCT 29.9 (L) 36.0 - 46.0 %   MCV 87.7 78.0 - 100.0 fL   MCH 28.2 26.0 - 34.0 pg   MCHC 32.1 30.0 - 36.0 g/dL   RDW 15.4 11.5 - 15.5 %   Platelets 482 (H) 150 - 400 K/uL  Comprehensive metabolic panel     Status: Abnormal   Collection Time: 05/10/17  6:25 PM  Result Value Ref Range   Sodium 136 135 - 145 mmol/L   Potassium 4.5 3.5 - 5.1 mmol/L   Chloride 107 101 - 111 mmol/L   CO2 20 (L) 22 - 32 mmol/L   Glucose, Bld 80 65 - 99 mg/dL   BUN 11 6 - 20 mg/dL   Creatinine, Ser 0.72 0.44 - 1.00 mg/dL   Calcium 8.8 (L) 8.9 - 10.3 mg/dL   Total Protein 6.5 6.5 - 8.1 g/dL   Albumin 3.3 (L) 3.5 - 5.0 g/dL   AST 40 15 - 41 U/L   ALT 20 14 - 54 U/L   Alkaline Phosphatase 125 38 - 126 U/L   Total Bilirubin 0.9 0.3 - 1.2 mg/dL   GFR calc non Af Amer >60 >60 mL/min   GFR calc Af Amer >60 >60 mL/min    Comment: (NOTE) The eGFR has been calculated using the CKD EPI equation. This calculation has not been validated in all clinical situations. eGFR's  persistently <60 mL/min signify possible Chronic Kidney Disease.    Anion gap 9 5 - 15    Ct Angio Chest Pe W Or Wo Contrast  Result Date: 05/11/2017 CLINICAL DATA:  Central and left chest heaviness and dyspnea. Six days postpartum. Shortness of breath. EXAM: CT ANGIOGRAPHY CHEST WITH CONTRAST TECHNIQUE: Multidetector CT imaging of the chest was performed using the standard protocol during bolus administration of intravenous contrast. Multiplanar CT image reconstructions and MIPs were obtained to evaluate the vascular anatomy. CONTRAST:  100 mL Isovue 370 COMPARISON:  None. FINDINGS: Cardiovascular: Moderately good opacification of the central and segmental pulmonary artery's. No focal filling defects. No evidence of significant pulmonary embolus. Motion artifact limits evaluation of the peripheral artery's. Normal heart size. No pericardial effusion. Normal caliber thoracic aorta. No aortic dissection. Great vessel origins are patent. Mediastinum/Nodes: Residual thymic tissue in the anterior mediastinum. No significant lymphadenopathy in the chest. Esophagus is decompressed. Lungs/Pleura: Small bilateral pleural effusions. Patchy airspace disease in the right middle lung and right lower lung. This may represent edema or pneumonia. There a few scattered patchy nodular opacities in the right upper lung as well. No pneumothorax. Airways are patent. Upper Abdomen: No acute process demonstrated in the visualized upper abdomen. Musculoskeletal: No chest wall abnormality. No acute or significant osseous findings. Review of the MIP images confirms the above findings. IMPRESSION: 1. No evidence of significant pulmonary embolus. 2. Small bilateral pleural effusions. 3. Patchy airspace disease in the right middle lung and right lower lung with scattered nodular opacities in the right upper lung. Changes may represent edema or pneumonia. Electronically Signed   By: Lucienne Capers M.D.   On: 05/11/2017 01:35   Dg  Chest Port 1v Same Day  Result Date: 05/10/2017 CLINICAL DATA:  Dyspnea  and heaviness in the center chest and left chest since yesterday. Elevated blood pressure and headaches. Postpartum since 05/04/2017. EXAM: PORTABLE CHEST 1 VIEW COMPARISON:  None. FINDINGS: Borderline heart size and pulmonary vascularity. There is a diffuse interstitial pattern to the lungs in a perihilar distribution with peribronchial thickening. This likely represents small airways disease. No focal consolidation. No blunting of costophrenic angles. No pneumothorax. Mediastinal contours appear intact. 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 No focal consolidation. Electronically Signed   By: Lucienne Capers M.D.   On: 05/10/2017 21:09     Assessment & Plan:  21 y.o. G1P1001 s/p SVD on 05/04/17 after IOL for GHTN; readmitted for severe postpartum preeclampsia.   1) Will order ECHO today given concern about possible cardiomyopathy; will follow up results and manage accordingly. 2) Continue magnesium sulfate x 24 hours, Enalapril added for BP control. 3) Lasix ordered to help with ?pleural effusions and further diuresis; she is doing well thus far. 4) Continue close observation.   Verita Schneiders, MD, [place] Attending [place], [facility]

## 2017-05-11 NOTE — Progress Notes (Signed)
  Echocardiogram 2D Echocardiogram has been performed.  Nicole SavoyCasey N Madex Mcmahon 05/11/2017, 12:21 PM

## 2017-05-11 NOTE — Progress Notes (Signed)
Nurse accompanied patient to radiology for CT scan

## 2017-05-11 NOTE — Progress Notes (Signed)
Patient returned from radiology

## 2017-05-12 MED ORDER — ENALAPRIL MALEATE 10 MG PO TABS: 10.0000 mg | ORAL_TABLET | Freq: Every day | ORAL | 1 refills | Status: DC

## 2017-05-12 NOTE — Progress Notes (Signed)
Mom has been readmitted to hospital due to high BP. Mom has baby latched to breast as I went into room. Reports milk is in and baby is nursing well with no pain. Reports baby is nursing q 3 hours and she is pumping after nursing because her breasts are so full. Pumping 3-4 oz after nursing. Reports breasts do feel softer after nursing. No questions at present.

## 2017-05-12 NOTE — Discharge Summary (Signed)
Physician Discharge Summary  Patient ID: Nicole Mcmahon [place] MRN: [id number] DOB/AGE: 1996/01/10 21 y.o.  Admit date: 05/10/2017 Discharge date: 05/12/2017  Admission Diagnoses:Preeclampsia  Discharge Diagnoses:  Principal Problem:   Preeclampsia in postpartum period   Discharged Condition: good  Hospital Course:  Obstetric History and Physical  Nicole Mcmahon Matt is a 21 y.o. G1P1001 PPD#6 s/p SVD after IOL for gestational HTN. She was seen at the office today for BP check and had severe range BP, also complaining of headache. She has had headaches over the last year but this is only since delivery, both frontal and in the back and in lower back, she has had this headache off and on since delivery, did have an epidural. She also reports chest "heaviness" for 2-3 days, particularly at night and is having trouble catching her breath. Also occasionally feels like her heart is racing. Husband has noticed she is not herself since she delivered but he attributed it to her having had a delivery.  Bleeding is minimal, abdominal pain is well controlled. Denies nausea/vomiting, denies bowel/bladder issues. Baby is well and she is breastfeeding.   Her post partum course was uncomplicated. She did not require any anti-hypertensives during her stay with mildly elevated and then normal BP. She was discharged home in good condition on PPD#2      Patient Active Problem List   Diagnosis Date Noted  . Preeclampsia in postpartum period 05/10/2017  . SVD (spontaneous vaginal delivery) 05/04/2017  . Gestational hypertension, third trimester 04/26/2017  . Rubella non-immune status, antepartum 01/22/2017  . Maternal varicella, non-immune 01/22/2017  . Anemia affecting pregnancy, antepartum 01/22/2017  . Supervision of high-risk pregnancy 01/17/2017  . H/O genital injury 01/17/2017   She is breast feeding and using condoms for contraception.      Past Medical History:  Diagnosis Date  . Asthma   .  Blood dyscrasia   . H/O genital injury    Childhood injury where she jumped and landed on a stick that went into her vagina.   . Pregnancy induced hypertension   . Sickle cell trait (HCC)     History reviewed. No pertinent surgical history.                  OB History  Gravida Para Term Preterm AB Living  1 1 1     1   SAB TAB Ectopic Multiple Live Births        0 1    # Outcome Date GA Lbr Len/2nd Weight Sex Delivery Anes PTL Lv  1 Term 05/04/17 [id number] 23:05 / 00:32 6 lb 3 oz (2.807 kg) M Vag-Spont EPI  LIV      Social History        Socioeconomic History  . Marital status: Married    Spouse name: None  . Number of children: None  . Years of education: None  . Highest education level: None  Social Needs  . Financial resource strain: None  . Food insecurity - worry: None  . Food insecurity - inability: None  . Transportation needs - medical: None  . Transportation needs - non-medical: None  Occupational History  . None  Tobacco Use  . Smoking status: Never Smoker  . Smokeless tobacco: Never Used  Substance and Sexual Activity  . Alcohol use: No  . Drug use: No  . Sexual activity: No  Other Topics Concern  . None  Social History Narrative  . None    History reviewed. No pertinent family history.  Medications Prior to Admission  Medication Sig Dispense Refill Last Dose  . ferrous sulfate 325 (65 FE) MG tablet Take 325 mg daily with breakfast by mouth.   05/10/2017 at Unknown time  . ibuprofen (ADVIL,MOTRIN) 600 MG tablet Take 1 tablet (600 mg total) every 6 (six) hours by mouth. (Patient not taking: Reported on 05/10/2017) 30 tablet 0 Not Taking  . Prenat w/o A-FeCbGl-DSS-FA-DHA (CITRANATAL 90 DHA) 90-1 & 300 MG MISC Take 1 tablet by mouth daily. (Patient not taking: Reported on 05/03/2017) 30 each 12 Not Taking  . ranitidine (ZANTAC) 150 MG tablet Take 1 tablet (150 mg total) by mouth 2 (two) times daily. (Patient not taking: Reported  on 05/10/2017) 60 tablet 11 Not Taking  . senna-docusate (SENOKOT-S) 8.6-50 MG tablet Take 2 tablets daily by mouth. (Patient not taking: Reported on 05/10/2017) 30 tablet 0 Not Taking    No Known Allergies  Review of Systems: Negative except for what is mentioned in HPI.  Physical Exam: BP (!) 154/93 (BP Location: Left Arm)   Pulse (!) 128   Temp 98.9 F (37.2 C) (Oral)   Resp 18   Ht 5\' 1"  (1.549 m)   Wt 166 lb (75.3 kg)   SpO2 100%   BMI 31.37 kg/m  CONSTITUTIONAL: Well-developed, well-nourished African American female in no acute distress appears fatigued.  HENT:  Normocephalic, atraumatic, External right and left ear normal. Oropharynx is clear and moist EYES: Conjunctivae and EOM are normal. Pupils are equal, round, and reactive to light. No scleral icterus.  NECK: Normal range of motion, supple, no masses SKIN: Skin is warm and dry. No rash noted. Not diaphoretic. No erythema. No pallor. NEUROLOGIC: Alert and oriented to person, place, and time. Normal reflexes, muscle tone coordination. No cranial nerve deficit noted. PSYCHIATRIC: Normal mood and affect. Normal behavior. Normal judgment and thought content. CARDIOVASCULAR: tachycardic heart rate noted, regular rhythm RESPIRATORY: Effort and breath sounds normal across all lobes no problems with respiration noted ABDOMEN: Soft, nontender, nondistended MUSCULOSKELETAL: Normal range of motion. +3 DTR in upper and lower extremities, +1 edema in lower extremities up to mid calf bilaterally, soft, no calf tenderness, negative Homan's sign   Pertinent Labs/Studies:   LabResultsLast24Hours       Results for orders placed or performed during the hospital encounter of 05/10/17 (from the past 24 hour(s))  Protein / creatinine ratio, urine     Status: Abnormal   Collection Time: 05/10/17  6:05 PM  Result Value Ref Range   Creatinine, Urine 135.00 mg/dL   Total Protein, Urine 124 mg/dL   Protein Creatinine Ratio 0.92  (H) 0.00 - 0.15 mg/mg[Cre]  CBC     Status: Abnormal   Collection Time: 05/10/17  6:25 PM  Result Value Ref Range   WBC 16.8 (H) 4.0 - 10.5 K/uL   RBC 3.41 (L) 3.87 - 5.11 MIL/uL   Hemoglobin 9.6 (L) 12.0 - 15.0 g/dL   HCT 16.129.9 (L) 09.636.0 - 04.546.0 %   MCV 87.7 78.0 - 100.0 fL   MCH 28.2 26.0 - 34.0 pg   MCHC 32.1 30.0 - 36.0 g/dL   RDW 40.915.4 81.111.5 - 91.415.5 %   Platelets 482 (H) 150 - 400 K/uL  Comprehensive metabolic panel     Status: Abnormal   Collection Time: 05/10/17  6:25 PM  Result Value Ref Range   Sodium 136 135 - 145 mmol/L   Potassium 4.5 3.5 - 5.1 mmol/L   Chloride 107 101 - 111 mmol/L   CO2  20 (L) 22 - 32 mmol/L   Glucose, Bld 80 65 - 99 mg/dL   BUN 11 6 - 20 mg/dL   Creatinine, Ser 1.61 0.44 - 1.00 mg/dL   Calcium 8.8 (L) 8.9 - 10.3 mg/dL   Total Protein 6.5 6.5 - 8.1 g/dL   Albumin 3.3 (L) 3.5 - 5.0 g/dL   AST 40 15 - 41 U/L   ALT 20 14 - 54 U/L   Alkaline Phosphatase 125 38 - 126 U/L   Total Bilirubin 0.9 0.3 - 1.2 mg/dL   GFR calc non Af Amer >60 >60 mL/min   GFR calc Af Amer >60 >60 mL/min   Anion gap 9 5 - 15      Assessment/Plan:  [place] Hanlon is a 21 y.o. G1P1001 at PPD#6 being admitted for post partum pre-eclampsia with severe features versus severe gestational hypertension. UPCr 0.9, but noted to have blood. She has required several IV pushes of labetalol in the MAU. She also complains of chest heaviness. She appears well with clear lungs and O2 sat 100% on room air but is is tachycardic, regular rhythm noted. Will order CXR and EKG. Will admit for 24 hrs MgSO4 and add PO antihypertensives if she requires it.   Regular diet MgSO4 4 gm loading dose then 2 g/hr CXR EKG Admit to 3rd floor   K. Therese Sarah, M.D. Attending Obstetrician & Gynecologist, [facility] for [facility], [facility]  05/10/2017, 8:36 PM  On day of discharge she felt well with no SOB headache or  CP.   Consults: None  Significant Diagnostic Studies: cardiac graphics: Echocardiogram: 11/9 done EKG left atrial enlargement Treatments: Labetalol and Lasix  Discharge Exam: Blood pressure (!) 145/89, pulse 86, temperature 99 F (37.2 C), temperature source Oral, resp. rate 18, height 5\' 1"  (1.549 m), weight 159 lb 12 oz (72.5 kg), SpO2 98 %, unknown if currently breastfeeding. General appearance: alert, cooperative and no distress GI: normal findings: not distended Nl respirations Disposition: 01-Home or Self Care   Allergies as of 05/12/2017   No Known Allergies     Medication List    TAKE these medications   CITRANATAL 90 DHA 90-1 & 300 MG Misc Take 1 tablet by mouth daily.   enalapril 10 MG tablet Commonly known as:  VASOTEC Take 1 tablet (10 mg total) daily by mouth. Start taking on:  05/13/2017   ferrous sulfate 325 (65 FE) MG tablet Take 325 mg daily with breakfast by mouth.   ibuprofen 600 MG tablet Commonly known as:  ADVIL,MOTRIN Take 1 tablet (600 mg total) every 6 (six) hours by mouth.   ranitidine 150 MG tablet Commonly known as:  ZANTAC Take 1 tablet (150 mg total) by mouth 2 (two) times daily.   senna-docusate 8.6-50 MG tablet Commonly known as:  Senokot-S Take 2 tablets daily by mouth.      Follow-up Information    [facility] for [facility] Follow up in 4 day(s).   Specialty:  Obstetrics and Gynecology Why:  BP check Contact information: [address] [place] [address] [phone]          Signed: Scheryl Darter 05/12/2017, 10:34 AM

## 2017-05-12 NOTE — Progress Notes (Signed)
Discharged home ambulatory in stable condition.

## 2017-05-12 NOTE — Progress Notes (Signed)
Discharge instructions given to patient and she verbalized understanding of all instructions given. Written copy of AVS given to patient. 

## 2017-05-12 NOTE — Discharge Instructions (Signed)
Postpartum Hypertension °Postpartum hypertension is high blood pressure after pregnancy that remains higher than normal for more than two days after delivery. You may not realize that you have postpartum hypertension if your blood pressure is not being checked regularly. In some cases, postpartum hypertension will go away on its own, usually within a week of delivery. However, for some women, medical treatment is required to prevent serious complications, such as seizures or stroke. °The following things can affect your blood pressure: °· The type of delivery you had. °· Having received IV fluids or other medicines during or after delivery. ° °What are the causes? °Postpartum hypertension may be caused by any of the following or by a combination of any of the following: °· Hypertension that existed before pregnancy (chronic hypertension). °· Gestational hypertension. °· Preeclampsia or eclampsia. °· Receiving a lot of fluid through an IV during or after delivery. °· Medicines. °· HELLP syndrome. °· Hyperthyroidism. °· Stroke. °· Other rare neurological or blood disorders. ° °In some cases, the cause may not be known. °What increases the risk? °Postpartum hypertension can be related to one or more risk factors, such as: °· Chronic hypertension. In some cases, this may not have been diagnosed before pregnancy. °· Obesity. °· Type 2 diabetes. °· Kidney disease. °· Family history of preeclampsia. °· Other medical conditions that cause hormonal imbalances. ° °What are the signs or symptoms? °As with all types of hypertension, postpartum hypertension may not have any symptoms. Depending on how high your blood pressure is, you may experience: °· Headaches. These may be mild, moderate, or severe. They may also be steady, constant, or sudden in onset (thunderclap headache). °· Visual changes. °· Dizziness. °· Shortness of breath. °· Swelling of your hands, feet, lower legs, or face. In some cases, you may have swelling in  more than one of these locations. °· Heart palpitations or a racing heartbeat. °· Difficulty breathing while lying down. °· Decreased urination. ° °Other rare signs and symptoms may include: °· Sweating more than usual. This lasts longer than a few days after delivery. °· Chest pain. °· Sudden dizziness when you get up from sitting or lying down. °· Seizures. °· Nausea or vomiting. °· Abdominal pain. ° °How is this diagnosed? °The diagnosis of postpartum hypertension is made through a combination of physical examination findings and testing of your blood and urine. You may also have additional tests, such as a CT scan or an MRI, to check for other complications of postpartum hypertension. °How is this treated? °When blood pressure is high enough to require treatment, your options may include: °· Medicines to reduce blood pressure (antihypertensives). Tell your health care provider if you are breastfeeding or if you plan to breastfeed. There are many antihypertensive medicines that are safe to take while breastfeeding. °· Stopping medicines that may be causing hypertension. °· Treating medical conditions that are causing hypertension. °· Treating the complications of hypertension, such as seizures, stroke, or kidney problems. ° °Your health care provider will also continue to monitor your blood pressure closely and repeatedly until it is within a safe range for you. °Follow these instructions at home: °· Take medicines only as directed by your health care provider. °· Get regular exercise after your health care provider tells you that it is safe. °· Follow your health care provider’s recommendations on fluid and salt restrictions. °· Do not use any tobacco products, including cigarettes, chewing tobacco, or electronic cigarettes. If you need help quitting, ask your health care provider. °·   Keep all follow-up visits as directed by your health care provider. This is important. °Contact a health care provider  if: °· Your symptoms get worse. °· You have new symptoms, such as: °? Headache. °? Dizziness. °? Visual changes. °Get help right away if: °· You develop a severe or sudden headache. °· You have seizures. °· You develop numbness or weakness on one side of your body. °· You have difficulty thinking, speaking, or swallowing. °· You develop severe abdominal pain. °· You develop difficulty breathing, chest pain, a racing heartbeat, or heart palpitations. °These symptoms may represent a serious problem that is an emergency. Do not wait to see if the symptoms will go away. Get medical help right away. Call your local emergency services (911 in the [place] Do not drive yourself to the hospital. °This information is not intended to replace advice given to you by your health care provider. Make sure you discuss any questions you have with your health care provider. °Document Released: 02/20/2014 Document Revised: 11/22/2015 Document Reviewed: 01/01/2014 °Elsevier Interactive Patient Education © 2018 [employer] ° °

## 2017-05-15 ENCOUNTER — Encounter (HOSPITAL_COMMUNITY): Payer: Self-pay

## 2017-05-15 ENCOUNTER — Inpatient Hospital Stay (HOSPITAL_COMMUNITY)
Admission: AD | Admit: 2017-05-15 | Discharge: 2017-05-15 | Disposition: A | Discharge status: Home / Self Care | Payer: Medicaid Other | Source: Ambulatory Visit | Attending: Family Medicine | Admitting: Family Medicine

## 2017-05-15 ENCOUNTER — Telehealth: Payer: Self-pay | Admitting: Radiology

## 2017-05-15 ENCOUNTER — Other Ambulatory Visit: Payer: Self-pay

## 2017-05-15 DIAGNOSIS — O165 Unspecified maternal hypertension, complicating the puerperium: Secondary | ICD-10-CM

## 2017-05-15 DIAGNOSIS — R030 Elevated blood-pressure reading, without diagnosis of hypertension: Secondary | ICD-10-CM | POA: Diagnosis present

## 2017-05-15 DIAGNOSIS — O135 Gestational [pregnancy-induced] hypertension without significant proteinuria, complicating the puerperium: Secondary | ICD-10-CM | POA: Insufficient documentation

## 2017-05-15 LAB — CBC
HCT: 33.2 % — ABNORMAL LOW (ref 36.0–46.0)
Hemoglobin: 10.4 g/dL — ABNORMAL LOW (ref 12.0–15.0)
MCH: 27.7 pg (ref 26.0–34.0)
MCHC: 31.3 g/dL (ref 30.0–36.0)
MCV: 88.3 fL (ref 78.0–100.0)
PLATELETS: 493 10*3/uL — AB (ref 150–400)
RBC: 3.76 MIL/uL — ABNORMAL LOW (ref 3.87–5.11)
RDW: 14.7 % (ref 11.5–15.5)
WBC: 12.2 10*3/uL — ABNORMAL HIGH (ref 4.0–10.5)

## 2017-05-15 LAB — COMPREHENSIVE METABOLIC PANEL
ALBUMIN: 3.8 g/dL (ref 3.5–5.0)
ALK PHOS: 103 U/L (ref 38–126)
ALT: 14 U/L (ref 14–54)
ANION GAP: 9 (ref 5–15)
AST: 30 U/L (ref 15–41)
BILIRUBIN TOTAL: 0.8 mg/dL (ref 0.3–1.2)
BUN: 11 mg/dL (ref 6–20)
CALCIUM: 9.5 mg/dL (ref 8.9–10.3)
CO2: 21 mmol/L — ABNORMAL LOW (ref 22–32)
CREATININE: 0.85 mg/dL (ref 0.44–1.00)
Chloride: 108 mmol/L (ref 101–111)
GFR calc Af Amer: >60 mL/min (ref >60)
GFR calc non Af Amer: >60 mL/min (ref >60)
GLUCOSE: 86 mg/dL (ref 65–99)
Potassium: 4.9 mmol/L (ref 3.5–5.1)
SODIUM: 138 mmol/L (ref 135–145)
TOTAL PROTEIN: 7.3 g/dL (ref 6.5–8.1)

## 2017-05-15 MED ORDER — ENALAPRIL MALEATE 20 MG PO TABS: 40.0000 mg | ORAL_TABLET | Freq: Every day | ORAL | 1 refills | Status: DC

## 2017-05-15 MED ORDER — HYDRALAZINE HCL 20 MG/ML IJ SOLN
10.0000 mg | Freq: Once | INTRAMUSCULAR | Status: AC | PRN
  Administered 2017-05-15: 10 mg via INTRAVENOUS
  Filled 2017-05-15: qty 1

## 2017-05-15 MED ORDER — LABETALOL HCL 5 MG/ML IV SOLN
20.0000 mg | INTRAVENOUS | Status: AC | PRN
  Administered 2017-05-15: 40 mg via INTRAVENOUS
  Administered 2017-05-15: 20 mg via INTRAVENOUS
  Administered 2017-05-15: 80 mg via INTRAVENOUS
  Filled 2017-05-15: qty 16
  Filled 2017-05-15: qty 4
  Filled 2017-05-15: qty 8

## 2017-05-15 NOTE — MAU Note (Signed)
Vag del 11/2,  BP elevated towards end of preg.  Home nurse out today, BP was elevated ? 190/160. denies HA, visual changes, epigastric pain or change in swelling.

## 2017-05-15 NOTE — Discharge Instructions (Signed)
Tonight when you get home, take 20 mg of enalapril (should be 2 tablets of 10 mg) In the morning, start taking 40 mg of enalapril (which would 4 of your 10 mg tablets) -- you can divide it into 2 tablets in the morning & 2 in the evening.       Postpartum Hypertension Postpartum hypertension is high blood pressure after pregnancy that remains higher than normal for more than two days after delivery. You may not realize that you have postpartum hypertension if your blood pressure is not being checked regularly. In some cases, postpartum hypertension will go away on its own, usually within a week of delivery. However, for some women, medical treatment is required to prevent serious complications, such as seizures or stroke. The following things can affect your blood pressure:  The type of delivery you had.  Having received IV fluids or other medicines during or after delivery.  What are the causes? Postpartum hypertension may be caused by any of the following or by a combination of any of the following:  Hypertension that existed before pregnancy (chronic hypertension).  Gestational hypertension.  Preeclampsia or eclampsia.  Receiving a lot of fluid through an IV during or after delivery.  Medicines.  HELLP syndrome.  Hyperthyroidism.  Stroke.  Other rare neurological or blood disorders.  In some cases, the cause may not be known. What increases the risk? Postpartum hypertension can be related to one or more risk factors, such as:  Chronic hypertension. In some cases, this may not have been diagnosed before pregnancy.  Obesity.  Type 2 diabetes.  Kidney disease.  Family history of preeclampsia.  Other medical conditions that cause hormonal imbalances.  What are the signs or symptoms? As with all types of hypertension, postpartum hypertension may not have any symptoms. Depending on how high your blood pressure is, you may experience:  Headaches. These may be  mild, moderate, or severe. They may also be steady, constant, or sudden in onset (thunderclap headache).  Visual changes.  Dizziness.  Shortness of breath.  Swelling of your hands, feet, lower legs, or face. In some cases, you may have swelling in more than one of these locations.  Heart palpitations or a racing heartbeat.  Difficulty breathing while lying down.  Decreased urination.  Other rare signs and symptoms may include:  Sweating more than usual. This lasts longer than a few days after delivery.  Chest pain.  Sudden dizziness when you get up from sitting or lying down.  Seizures.  Nausea or vomiting.  Abdominal pain.  How is this diagnosed? The diagnosis of postpartum hypertension is made through a combination of physical examination findings and testing of your blood and urine. You may also have additional tests, such as a CT scan or an MRI, to check for other complications of postpartum hypertension. How is this treated? When blood pressure is high enough to require treatment, your options may include:  Medicines to reduce blood pressure (antihypertensives). Tell your health care provider if you are breastfeeding or if you plan to breastfeed. There are many antihypertensive medicines that are safe to take while breastfeeding.  Stopping medicines that may be causing hypertension.  Treating medical conditions that are causing hypertension.  Treating the complications of hypertension, such as seizures, stroke, or kidney problems.  Your health care provider will also continue to monitor your blood pressure closely and repeatedly until it is within a safe range for you. Follow these instructions at home:  Take medicines only as directed  by your health care provider.  Get regular exercise after your health care provider tells you that it is safe.  Follow your health care providers recommendations on fluid and salt restrictions.  Do not use any tobacco products,  including cigarettes, chewing tobacco, or electronic cigarettes. If you need help quitting, ask your health care provider.  Keep all follow-up visits as directed by your health care provider. This is important. Contact a health care provider if:  Your symptoms get worse.  You have new symptoms, such as: ? Headache. ? Dizziness. ? Visual changes. Get help right away if:  You develop a severe or sudden headache.  You have seizures.  You develop numbness or weakness on one side of your body.  You have difficulty thinking, speaking, or swallowing.  You develop severe abdominal pain.  You develop difficulty breathing, chest pain, a racing heartbeat, or heart palpitations. These symptoms may represent a serious problem that is an emergency. Do not wait to see if the symptoms will go away. Get medical help right away. Call your local emergency services (911 in the [place] Do not drive yourself to the hospital. This information is not intended to replace advice given to you by your health care provider. Make sure you discuss any questions you have with your health care provider. Document Released: 02/20/2014 Document Revised: 11/22/2015 Document Reviewed: 01/01/2014 Elsevier Interactive Patient Education  [employer]2018 Elsevier Inc.

## 2017-05-15 NOTE — Telephone Encounter (Signed)
Home health nurse called, states that she was making postpartum appointment and that patient BP 190/120, states that [facility] and wanted to make sure that we also recommended this treatment plan, Agreed, [facility] BP

## 2017-05-15 NOTE — MAU Note (Signed)
Urine sent to lab 

## 2017-05-15 NOTE — MAU Provider Note (Signed)
History     CSN: [id number]  Arrival date and time: 05/15/17 1753   First Provider Initiated Contact with Patient 05/15/17 1839     21 y.o. postpartum female 11 days s/p SVD here with elevated BPs. Home health found her to be hypertensive during home visit today. She was admitted for postpartum preeclampsia 5 days ago and given Magnesium Sulfate. She was discharge home on Enalapril and reports taking her med today. Denies HA, visual disturbances, CP, RUQ pain, and SOB.   OB History    Gravida Para Term Preterm AB Living   1 1 1     1    SAB TAB Ectopic Multiple Live Births         0 1      Past Medical History:  Diagnosis Date  . Asthma   . Blood dyscrasia   . H/O genital injury    Childhood injury where she jumped and landed on a stick that went into her vagina.   . Pregnancy induced hypertension   . Sickle cell trait (HCC)     History reviewed. No pertinent surgical history.  History reviewed. No pertinent family history.  Social History   Tobacco Use  . Smoking status: Never Smoker  . Smokeless tobacco: Never Used  Substance Use Topics  . Alcohol use: No  . Drug use: No    Allergies: No Known Allergies  Medications Prior to Admission  Medication Sig Dispense Refill Last Dose  . enalapril (VASOTEC) 10 MG tablet Take 1 tablet (10 mg total) daily by mouth. 20 tablet 1   . ferrous sulfate 325 (65 FE) MG tablet Take 325 mg daily with breakfast by mouth.   05/10/2017 at Unknown time  . ibuprofen (ADVIL,MOTRIN) 600 MG tablet Take 1 tablet (600 mg total) every 6 (six) hours by mouth. (Patient not taking: Reported on 05/10/2017) 30 tablet 0 Not Taking  . Prenat w/o A-FeCbGl-DSS-FA-DHA (CITRANATAL 90 DHA) 90-1 & 300 MG MISC Take 1 tablet by mouth daily. (Patient not taking: Reported on 05/03/2017) 30 each 12 Not Taking  . ranitidine (ZANTAC) 150 MG tablet Take 1 tablet (150 mg total) by mouth 2 (two) times daily. (Patient not taking: Reported on 05/10/2017) 60 tablet 11 Not  Taking  . senna-docusate (SENOKOT-S) 8.6-50 MG tablet Take 2 tablets daily by mouth. (Patient not taking: Reported on 05/10/2017) 30 tablet 0 Not Taking    Review of Systems  Eyes: Negative for visual disturbance.  Respiratory: Negative for shortness of breath.   Cardiovascular: Negative for chest pain.  Gastrointestinal: Negative for abdominal pain.  Neurological: Negative for headaches.   Physical Exam   Blood pressure (!) 180/106, pulse 62, temperature 98.1 F (36.7 C), temperature source Oral, resp. rate 16, SpO2 100 %, currently breastfeeding.  Patient Vitals for the past 24 hrs:  BP Temp Temp src Pulse Resp SpO2  05/15/17 2031 (!) 147/75 - - (!) 108 - -  05/15/17 2016 (!) 144/77 - - (!) 108 - -  05/15/17 2000 139/72 - - (!) 110 - -  05/15/17 1951 (!) 156/76 - - 97 - -  05/15/17 1941 (!) 172/111 - - 67 - -  05/15/17 1939 (!) 168/113 - - 68 - -  05/15/17 1925 (!) 172/119 - - 60 - -  05/15/17 1909 (!) 178/117 - - (!) 59 - -  05/15/17 1901 (!) 175/114 - - (!) 59 - -  05/15/17 1846 (!) 189/111 - - 76 - -  05/15/17 1835 (!) 180/106 - -  62 - -  05/15/17 1826 (!) 193/118 98.1 F (36.7 C) Oral - 16 100 %    Physical Exam  Constitutional: She is oriented to person, place, and time. She appears well-developed and well-nourished. No distress.  HENT:  Head: Normocephalic and atraumatic.  Neck: Normal range of motion.  Cardiovascular: Normal rate.  Respiratory: Effort normal. No respiratory distress.  Musculoskeletal: Normal range of motion. She exhibits no edema.  Neurological: She is alert and oriented to person, place, and time.  Skin: Skin is warm and dry.  Psychiatric: She has a normal mood and affect.   Results for orders placed or performed during the hospital encounter of 05/15/17 (from the past 24 hour(s))  Comprehensive metabolic panel     Status: Abnormal   Collection Time: 05/15/17  6:43 PM  Result Value Ref Range   Sodium 138 135 - 145 mmol/L   Potassium 4.9 3.5  - 5.1 mmol/L   Chloride 108 101 - 111 mmol/L   CO2 21 (L) 22 - 32 mmol/L   Glucose, Bld 86 65 - 99 mg/dL   BUN 11 6 - 20 mg/dL   Creatinine, Ser 0.100.85 0.44 - 1.00 mg/dL   Calcium 9.5 8.9 - 27.210.3 mg/dL   Total Protein 7.3 6.5 - 8.1 g/dL   Albumin 3.8 3.5 - 5.0 g/dL   AST 30 15 - 41 U/L   ALT 14 14 - 54 U/L   Alkaline Phosphatase 103 38 - 126 U/L   Total Bilirubin 0.8 0.3 - 1.2 mg/dL   GFR calc non Af Amer >60 >60 mL/min   GFR calc Af Amer >60 >60 mL/min   Anion gap 9 5 - 15  CBC     Status: Abnormal   Collection Time: 05/15/17  6:43 PM  Result Value Ref Range   WBC 12.2 (H) 4.0 - 10.5 K/uL   RBC 3.76 (L) 3.87 - 5.11 MIL/uL   Hemoglobin 10.4 (L) 12.0 - 15.0 g/dL   HCT 53.633.2 (L) 64.436.0 - 03.446.0 %   MCV 88.3 78.0 - 100.0 fL   MCH 27.7 26.0 - 34.0 pg   MCHC 31.3 30.0 - 36.0 g/dL   RDW 74.214.7 59.511.5 - 63.815.5 %   Platelets 493 (H) 150 - 400 K/uL   MAU Course  Procedures Labetalol Hydralazine  MDM Labs ordered and reviewed- normal. BP responding well to Hydralazine. Transfer of care given to E. Lyman BishopLawrence, FNP  Donette LarryBhambri, Melanie, CNM  05/15/2017 8:13 PM   BP improved with IV antihypertensives & pt remains asymptomatic. Discussed with Dr. Adrian BlackwaterStinson. Will increase enalapril dosage to 40 mg per day; pt to take 20 mg when she gets home tonight. Pt to f/u in office on Thursday for BP check Assessment and Plan  A:  1. Postpartum hypertension    P: Discharge home Take 20 mg of enalapril tonight. Start 40 mg tomorrow.  Discussed reasons to return to MAU Already has f/u in office tomorrow for BP check -- they will manage future BP checks  Judeth HornLawrence, Ryann Pauli, NP

## 2017-05-16 ENCOUNTER — Ambulatory Visit: Payer: Medicaid Other | Admitting: *Deleted

## 2017-05-16 VITALS — BP 179/95 | HR 75

## 2017-05-16 DIAGNOSIS — O133 Gestational [pregnancy-induced] hypertension without significant proteinuria, third trimester: Secondary | ICD-10-CM

## 2017-05-16 DIAGNOSIS — Z0130 Encounter for examination of blood pressure without abnormal findings: Secondary | ICD-10-CM

## 2017-05-16 MED ORDER — NIFEDIPINE ER OSMOTIC RELEASE 30 MG PO TB24: 30.0000 mg | ORAL_TABLET | Freq: Every day | ORAL | 2 refills | Status: DC

## 2017-05-16 NOTE — Progress Notes (Addendum)
I was present and evaluated this patient. Her BP is concerning. She is s/p MgSo4x 2 (on LD and then readmission for pp mag). Currently asymptomatic.   Per patient her enalapril dose was increased and she has not picked up the new rx. She will start this medication today and I have sent in nifedipine, patient will start if BP still elevated at Friday visit. Currently breastfeeding exclusively  Federico FlakeKimberly Niles Iyla Balzarini, MD, MPH, ABFM Attending Physician Faculty Practice- [facility]

## 2017-05-16 NOTE — Progress Notes (Signed)
Subjective:  Nicole Mcmahon is a 21 y.o. female with hypertension. Current Outpatient Medications  Medication Sig Dispense Refill  . enalapril (VASOTEC) 20 MG tablet Take 2 tablets (40 mg total) daily by mouth. 60 tablet 1  . ferrous sulfate 325 (65 FE) MG tablet Take 325 mg daily with breakfast by mouth.    Marland Kitchen. ibuprofen (ADVIL,MOTRIN) 600 MG tablet Take 1 tablet (600 mg total) every 6 (six) hours by mouth. (Patient not taking: Reported on 05/10/2017) 30 tablet 0  . NIFEdipine (PROCARDIA-XL/ADALAT-CC/NIFEDICAL-XL) 30 MG 24 hr tablet Take 1 tablet (30 mg total) daily by mouth. 30 tablet 2  . Prenat w/o A-FeCbGl-DSS-FA-DHA (CITRANATAL 90 DHA) 90-1 & 300 MG MISC Take 1 tablet by mouth daily. (Patient not taking: Reported on 05/03/2017) 30 each 12  . ranitidine (ZANTAC) 150 MG tablet Take 1 tablet (150 mg total) by mouth 2 (two) times daily. (Patient not taking: Reported on 05/10/2017) 60 tablet 11  . senna-docusate (SENOKOT-S) 8.6-50 MG tablet Take 2 tablets daily by mouth. (Patient not taking: Reported on 05/10/2017) 30 tablet 0   No current facility-administered medications for this visit.     Hypertension ROS: taking medications as instructed, no medication side effects noted, no TIA's, no chest pain on exertion, no dyspnea on exertion and no swelling of ankles.    Objective:  BP (!) 168/118   Pulse 75   Appearance alert, well appearing, and in no distress, oriented to person, place, and time and normal appearing weight. General exam BP noted to be well controlled today in office.    Assessment:   Hypertension asymptomatic, control uncertain, needs further observation and needs improvement.   Plan:  Continue Enalapril as directed After BP check in 2 days - can start Procardia if needed per Dr. Alvester MorinNewton Follow up: 2 days and as needed.

## 2017-05-18 ENCOUNTER — Ambulatory Visit: Payer: Medicaid Other

## 2017-05-18 VITALS — BP 160/110 | HR 112

## 2017-05-18 DIAGNOSIS — Z0130 Encounter for examination of blood pressure without abnormal findings: Secondary | ICD-10-CM

## 2017-05-18 NOTE — Progress Notes (Signed)
Patient presented to the office today for blood pressure check. Patient blood pressure continues to be up.She stated she has taken the medication as prescribed but was uncertain of the name of what she was taken.   I wrote down the medications and dosage as instructed by the provider of what she should be taken. I explained the importance of taken her medication as well. Per Dr.Dove patient should come back on Monday 05/21/2017  for another blood check and was advised to bring her medications with her to insure she is taking them correctly. Patient voice understanding.

## 2017-05-21 ENCOUNTER — Ambulatory Visit: Payer: Medicaid Other | Admitting: *Deleted

## 2017-05-21 VITALS — BP 177/76 | HR 82

## 2017-05-21 DIAGNOSIS — Z0130 Encounter for examination of blood pressure without abnormal findings: Secondary | ICD-10-CM

## 2017-05-21 DIAGNOSIS — O1495 Unspecified pre-eclampsia, complicating the puerperium: Secondary | ICD-10-CM

## 2017-05-21 MED ORDER — NIFEDIPINE ER OSMOTIC RELEASE 30 MG PO TB24: 30.0000 mg | ORAL_TABLET | Freq: Two times a day (BID) | ORAL | 2 refills | Status: DC

## 2017-05-21 NOTE — Progress Notes (Addendum)
Subjective:  Nicole Mcmahon is a 21 y.o. female with preeclampsia in postpartum period here for BP check.    Current Outpatient Medications  Medication Sig Dispense Refill  . enalapril (VASOTEC) 20 MG tablet Take 2 tablets (40 mg total) daily by mouth. (Patient not taking: Reported on 05/18/2017) 60 tablet 1  . ferrous sulfate 325 (65 FE) MG tablet Take 325 mg daily with breakfast by mouth.    Marland Kitchen. ibuprofen (ADVIL,MOTRIN) 600 MG tablet Take 1 tablet (600 mg total) every 6 (six) hours by mouth. (Patient not taking: Reported on 05/10/2017) 30 tablet 0  . NIFEdipine (PROCARDIA-XL/ADALAT-CC/NIFEDICAL-XL) 30 MG 24 hr tablet Take 1 tablet (30 mg total) daily by mouth. 30 tablet 2  . Prenat w/o A-FeCbGl-DSS-FA-DHA (CITRANATAL 90 DHA) 90-1 & 300 MG MISC Take 1 tablet by mouth daily. 30 each 12  . ranitidine (ZANTAC) 150 MG tablet Take 1 tablet (150 mg total) by mouth 2 (two) times daily. 60 tablet 11  . senna-docusate (SENOKOT-S) 8.6-50 MG tablet Take 2 tablets daily by mouth. 30 tablet 0   No current facility-administered medications for this visit.     Hypertension ROS: taking medications as instructed, no medication side effects noted, no TIA's, no chest pain on exertion, no dyspnea on exertion and no swelling of ankles.  New concerns: None.   Objective:  BP (!) 177/76   Pulse 82   Appearance alert, well appearing, and in no distress, oriented to person, place, and time and normal appearing weight. General exam BP noted to be well controlled today in office.    Assessment:   Hypertension control uncertain.   Plan:  Orders and follow up as documented in patient record. Recommended sodium restriction. The following changes are to be made: Keep enalapril 40mg  daily, change procardia to 30mg  bid per Dr Erin FullingHarraway-Smith. Follow up in 1 week for BP check.    Attestation of Attending Supervision of RN: Evaluation and management procedures were performed by the nurse under my supervision and  collaboration.  I have reviewed the nursing note and chart, and I agree with the management and plan.  Carolyn L. Harraway-Smith, M.D., Evern CoreFACOG

## 2017-05-28 ENCOUNTER — Ambulatory Visit: Payer: Medicaid Other

## 2017-05-28 VITALS — BP 127/74 | HR 101 | Wt 148.9 lb

## 2017-05-28 DIAGNOSIS — Z0130 Encounter for examination of blood pressure without abnormal findings: Secondary | ICD-10-CM

## 2017-05-28 NOTE — Progress Notes (Signed)
Patient presented to the office today for blood pressure check. Patient reports feeling pretty good today. She reports taking her medications as prescribed. No complaints of headache or blurred vision at this time. Patient has postpartum appointment schedule for 06/01/2017.

## 2017-05-30 NOTE — Progress Notes (Signed)
Attestation of Attending Supervision of Advanced Practitioner: Evaluation and management procedures were performed by the CMA under my supervision.  I have reviewed the CMA's note and chart, and I agree with the management and plan. I personal;ly reviewed the patient's chart and saw the patient in conjunction with the CMA. The plan documented is per my direction.    Federico FlakeKimberly Niles Newton, MD, MPH, ABFM Attending Physician Faculty Practice- [facility]

## 2017-06-01 ENCOUNTER — Ambulatory Visit: Payer: Medicaid Other | Admitting: Obstetrics & Gynecology

## 2017-06-27 ENCOUNTER — Ambulatory Visit: Payer: Medicaid Other | Admitting: Obstetrics and Gynecology

## 2017-07-10 ENCOUNTER — Encounter: Payer: Self-pay | Admitting: Obstetrics and Gynecology

## 2017-07-10 ENCOUNTER — Ambulatory Visit (INDEPENDENT_AMBULATORY_CARE_PROVIDER_SITE_OTHER): Payer: Medicaid Other | Admitting: Obstetrics and Gynecology

## 2017-07-10 DIAGNOSIS — Z1389 Encounter for screening for other disorder: Secondary | ICD-10-CM

## 2017-07-10 MED ORDER — NORETHINDRONE 0.35 MG PO TABS: 1.0000 | ORAL_TABLET | Freq: Every day | ORAL | 11 refills | Status: DC

## 2017-07-10 NOTE — Progress Notes (Signed)
Post Partum Exam  Nicole Mcmahon is a 22 y.o. 331P1001 female who presents for a postpartum visit. She is 9 weeks postpartum following a spontaneous vaginal delivery. I have fully reviewed the prenatal and intrapartum course. The delivery was at 38.3 gestational weeks.  Anesthesia: epidural. Postpartum course has been complicated by preeclampsia currently not on medication. Baby's course has been uncomplicated. Baby is feeding by breast. Bleeding no bleeding. Bowel function is normal. Bladder function is normal. Patient is sexually active. Contraception method is none. Postpartum depression screening:neg     Review of Systems Pertinent items are noted in HPI.    Objective:  Blood pressure 125/85, pulse 99, weight 133 lb (60.3 kg), currently breastfeeding.  General:  alert, cooperative and no distress   Breasts:  inspection negative, no nipple discharge or bleeding, no masses or nodularity palpable  Lungs: clear to auscultation bilaterally  Heart:  regular rate and rhythm  Abdomen: soft, non-tender; bowel sounds normal; no masses,  no organomegaly   Vulva:  normal  Vagina: normal vagina, no discharge, exudate, lesion, or erythema  Cervix:  multiparous appearance  Corpus: normal size, contour, position, consistency, mobility, non-tender  Adnexa:  normal adnexa and no mass, fullness, tenderness  Rectal Exam: Not performed.        Assessment:    Normal postpartum exam. Pap smear not done at today's visit.   Plan:   1. Contraception: oral progesterone-only contraceptive 2. Patient is medically cleared to resume all activities of daily living 3. Follow up in: 6 months for annual exam or as needed.

## 2017-07-26 IMAGING — US US OB COMP LESS 14 WK
1 series · 15 of 28 positions shown · non-contrast
Comparison: None.

CLINICAL DATA: Lower abdominal cramping, pregnant

EXAM:
OBSTETRIC <14 WK US AND TRANSVAGINAL OB US
TECHNIQUE: Both transabdominal and transvaginal ultrasound examinations were
performed for complete evaluation of the gestation as well as the
maternal uterus, adnexal regions, and pelvic cul-de-sac.
Transvaginal technique was performed to assess early pregnancy.

[Series 1: us ob comp less 14 wk · 106 acquisitions, 15 frames shown]
[im 1/106]
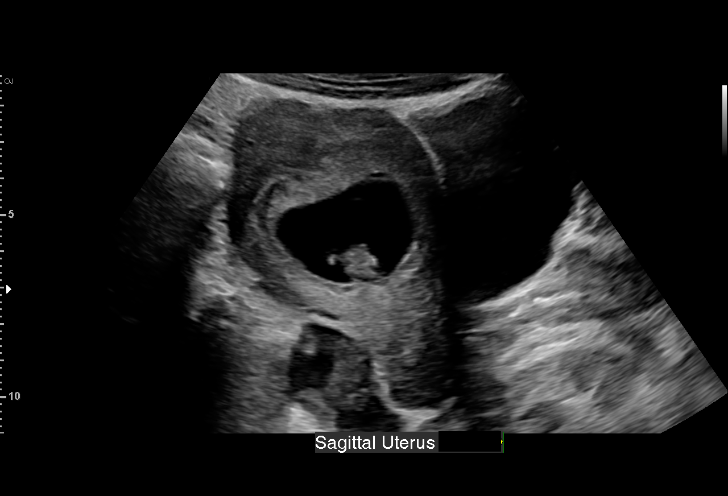
[im 8/106]
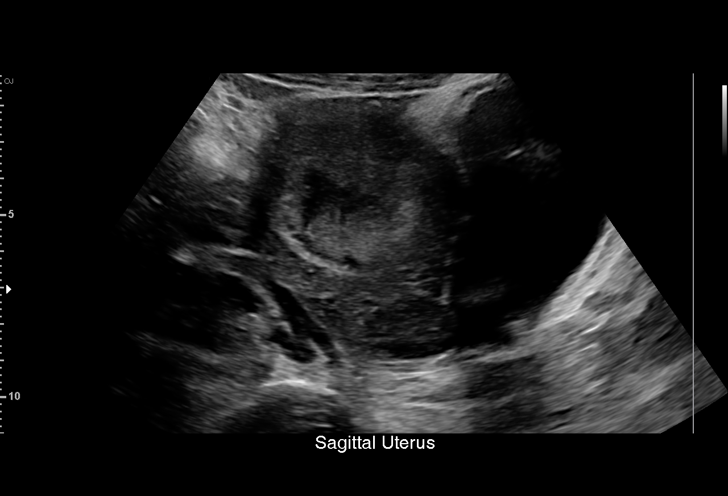
[im 16/106]
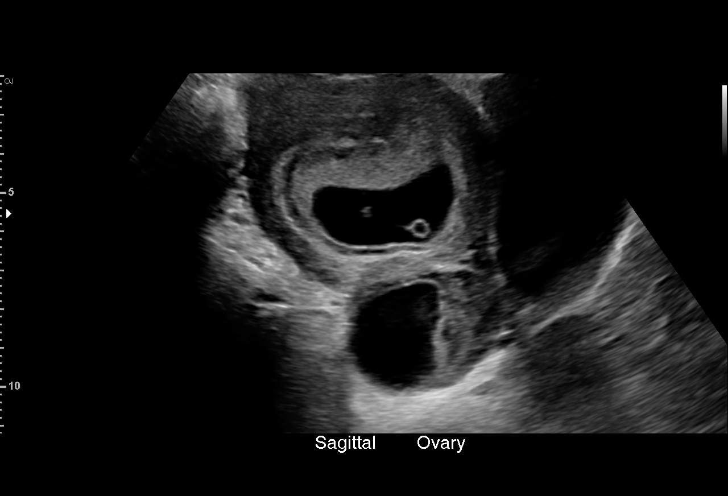
[im 24/106]
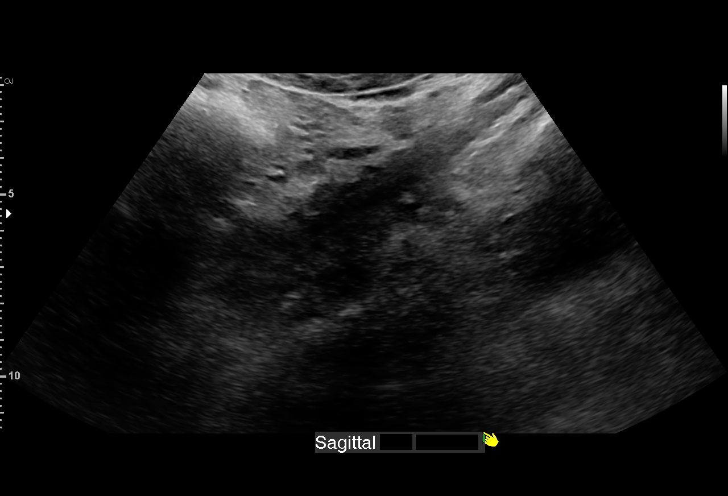
[im 32/106]
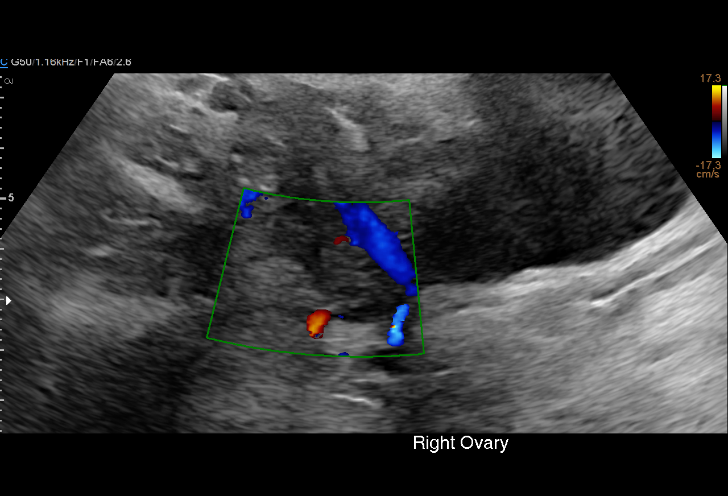
[im 39/106]
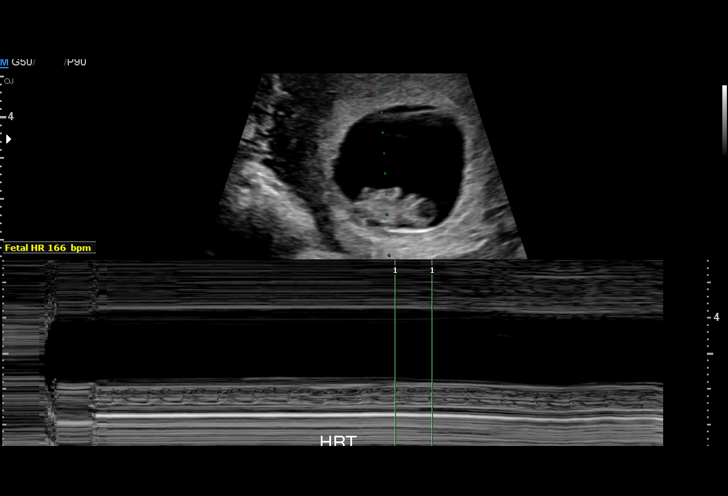
[im 47/106]
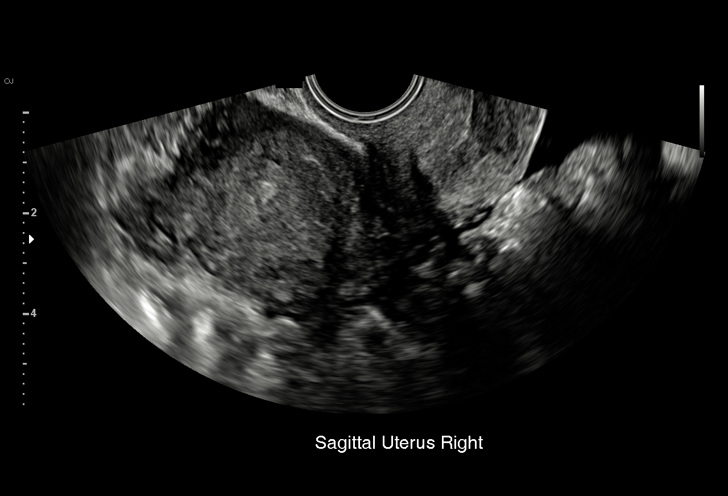
[im 55/106]
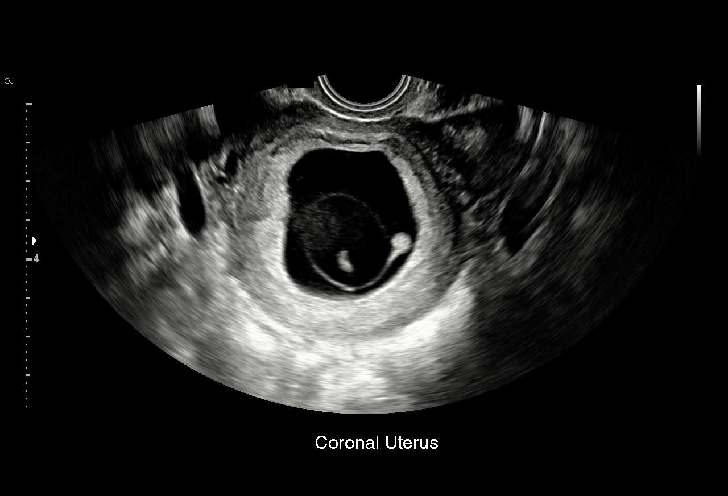
[im 59/106]
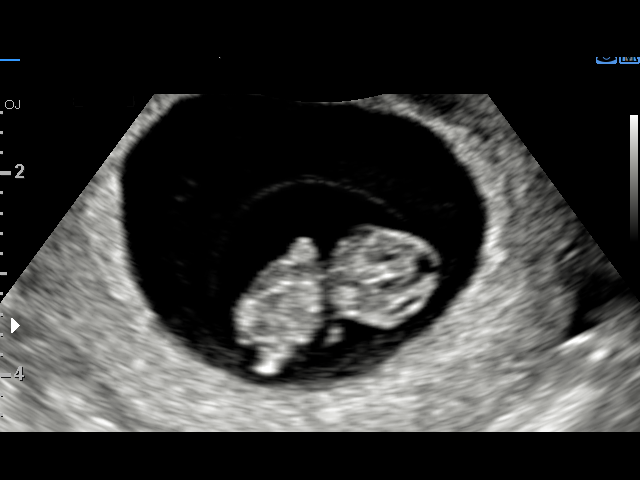
[im 67/106]
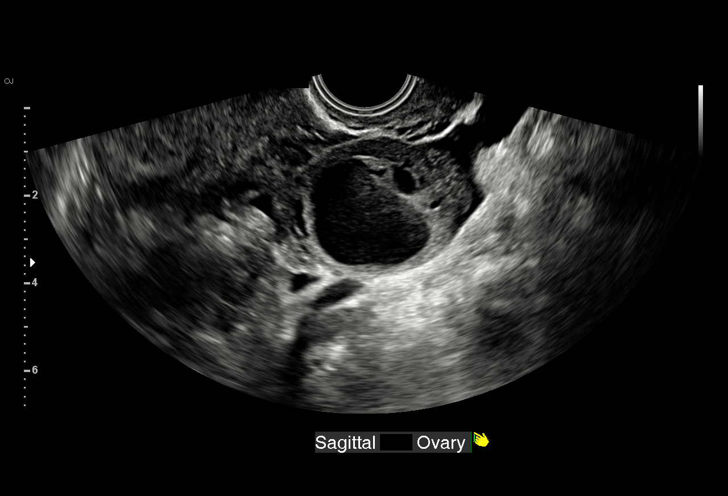
[im 74/106]
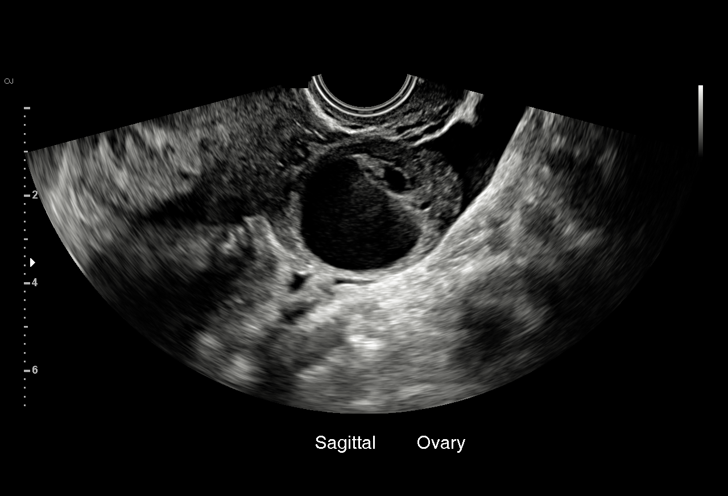
[im 82/106]
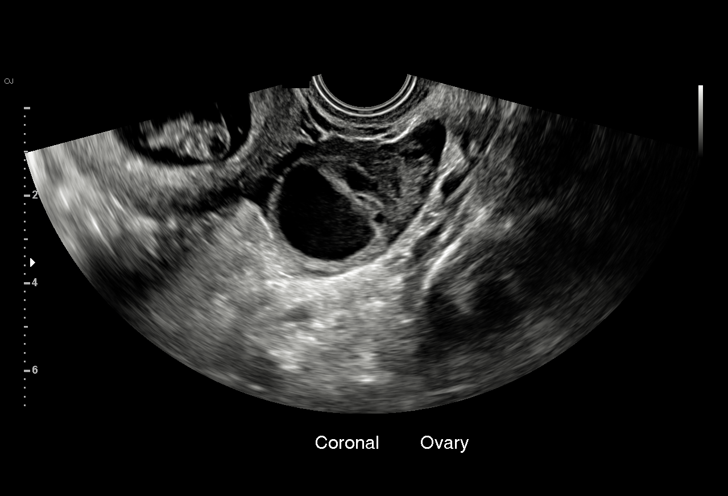
[im 90/106]
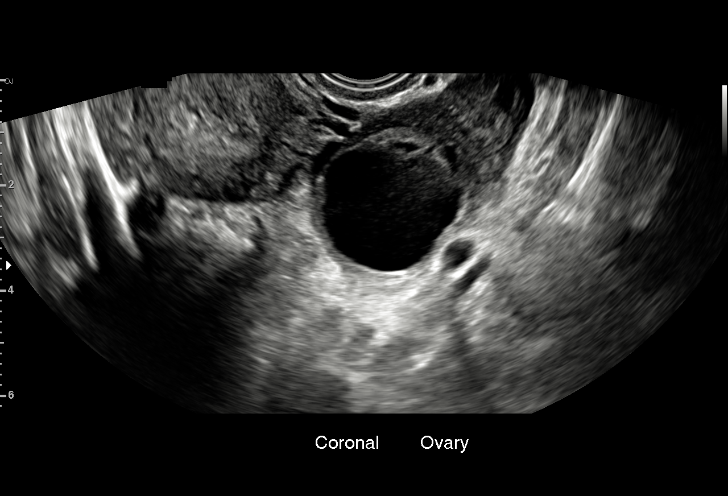
[im 98/106]
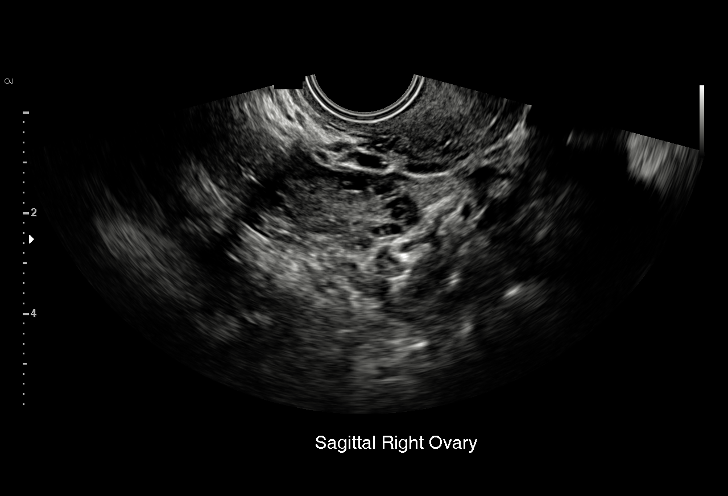
[im 106/106]
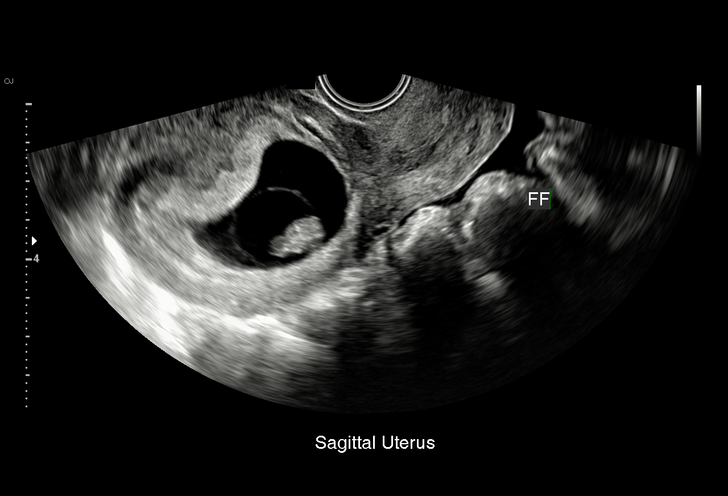

[15 of 28 positions shown; findings below may reference images not displayed]

FINDINGS: Intrauterine gestational sac: Single

Yolk sac:  Visualized.

Embryo:  Visualized.

Cardiac Activity: Visualized.

Heart Rate: 168  bpm

CRL:  22.5  mm   8 w   6 d                  US EDC: 05/16/2017

Subchorionic hemorrhage:  None visualized.

Maternal uterus/adnexae: Right ovaries within normal limits.

2.8 x 3.2 x 3.0 cm complex left ovarian cyst.

Small volume pelvic ascites, likely physiologic.
IMPRESSION: Single live intrauterine gestation, with estimated gestational age 8
weeks 6 days by crown-rump length.

## 2017-08-29 ENCOUNTER — Encounter: Payer: Self-pay | Admitting: Obstetrics and Gynecology

## 2017-08-29 ENCOUNTER — Ambulatory Visit (INDEPENDENT_AMBULATORY_CARE_PROVIDER_SITE_OTHER): Payer: Medicaid Other | Admitting: Obstetrics and Gynecology

## 2017-08-29 VITALS — BP 119/68 | HR 74 | Ht 61.5 in | Wt 136.0 lb

## 2017-08-29 DIAGNOSIS — Z30011 Encounter for initial prescription of contraceptive pills: Secondary | ICD-10-CM | POA: Diagnosis not present

## 2017-08-29 DIAGNOSIS — Z3202 Encounter for pregnancy test, result negative: Secondary | ICD-10-CM

## 2017-08-29 DIAGNOSIS — N912 Amenorrhea, unspecified: Secondary | ICD-10-CM | POA: Diagnosis not present

## 2017-08-29 LAB — POCT URINE PREGNANCY: PREG TEST UR: NEGATIVE

## 2017-08-29 MED ORDER — LO LOESTRIN FE 1 MG-10 MCG / 10 MCG PO TABS: 1.0000 | ORAL_TABLET | Freq: Every day | ORAL | 3 refills | Status: DC

## 2017-08-29 MED ORDER — FERROUS SULFATE 325 (65 FE) MG PO TABS: 325.0000 mg | ORAL_TABLET | Freq: Every day | ORAL | 3 refills | Status: DC

## 2017-08-29 NOTE — Progress Notes (Signed)
Obstetrics and Gynecology Established Patient Evaluation  Appointment Date: 08/29/2017  [facility]  Primary Care Provider: Patient, No Pcp Per  Referring Provider: No ref. provider found  Chief Complaint: contraception questions  History of Present Illness: Dayton MartesKabrera Zalesky is a 22 y.o. African-American G1P1001 (No LMP recorded.), seen for the above chief complaint.   Patient amenorrheic and has questions about starting OCPs. She never started the micronor, last sex 06/2017. Pt still breastfeeding and isn't on BP meds anymore.    Review of Systems: as noted in the History of Present Illness.  Past Medical History:  Past Medical History:  Diagnosis Date  . Asthma   . Blood dyscrasia   . H/O genital injury    Childhood injury where she jumped and landed on a stick that went into her vagina.   . Pregnancy induced hypertension   . Sickle cell trait (HCC)     Past Surgical History:  No past surgical history on file.  Past Obstetrical History:  OB History  Gravida Para Term Preterm AB Living  1 1 1     1   SAB TAB Ectopic Multiple Live Births        0 1    # Outcome Date GA Lbr Len/2nd Weight Sex Delivery Anes PTL Lv  1 Term 05/04/17 [id number] 23:05 / 00:32 6 lb 3 oz (2.807 kg) M Vag-Spont EPI  LIV      History of Pap Smear(s): No.   Social History:  Social History   Socioeconomic History  . Marital status: Married    Spouse name: Not on file  . Number of children: Not on file  . Years of education: Not on file  . Highest education level: Not on file  Social Needs  . Financial resource strain: Not on file  . Food insecurity - worry: Not on file  . Food insecurity - inability: Not on file  . Transportation needs - medical: Not on file  . Transportation needs - non-medical: Not on file  Occupational History  . Not on file  Tobacco Use  . Smoking status: Never Smoker  . Smokeless tobacco: Never Used  Substance and Sexual  Activity  . Alcohol use: No  . Drug use: No  . Sexual activity: No  Other Topics Concern  . Not on file  Social History Narrative  . Not on file    Family History: No family history on file.  Medications Dayton MartesKabrera Apachito had no medications administered during this visit. Current Outpatient Medications  Medication Sig Dispense Refill  . ferrous sulfate 325 (65 FE) MG tablet Take 325 mg daily with breakfast by mouth.     No current facility-administered medications for this visit.     Allergies Patient has no known allergies.   Physical Exam:  BP 119/68   Pulse 74   Ht 5' 1.5" (1.562 m)   Wt 136 lb (61.7 kg)   Breastfeeding? Yes   BMI 25.28 kg/m  Body mass index is 25.28 kg/m.  General appearance: Well nourished, well developed female in no acute distress.   Laboratory: UPT neg  Radiology: none  Assessment: pt stable  Plan:  1. Contraception D/w her re: r/b of OCPs and she'd like to do a regular OCP. Lo loestrin sent in and told her can consider effective in one week.  - POCT urine pregnancy  2. Cervical cancer screening Pt declines today. D/w her can do at her annual later this year  RTC 22m BP check, 5m annual exam  Cornelia Copa MD Attending Center for [facility])

## 2017-08-29 NOTE — Progress Notes (Signed)
Had had a headache for 3-4 days, has history of hypertension.  Wanted to make sure it was not related to her blood pressure. Wants refill of her iron and BC pills.  Currently not using contraception.  Headaches are now resolved.  Does have fatigue.

## 2017-10-08 ENCOUNTER — Encounter: Payer: Self-pay | Admitting: Radiology

## 2018-05-15 ENCOUNTER — Encounter: Payer: Self-pay | Admitting: Radiology

## 2018-07-03 NOTE — L&D Delivery Note (Signed)
OB/GYN Faculty Practice Delivery Note  Nautia Holzer is a 22 y.o. G2P1001 s/p vaginal delivery at [redacted]w[redacted]d. She was admitted for IOL 2/2 post dates and a BPP 2/8.   ROM: 8h 10m with clear fluid GBS Status: Negative Maximum Maternal Temperature: 99.5  Labor Progress: . Patient was admitted for IOL and started on low-dose pitocin. She was AROMed, had clear fluid, and then labored down under epidural anesthesia until she was fully dilated.  Delivery Date/Time: 03/14/2019, 0754 Delivery: Called to room and patient was complete and pushing. Head delivered LOA. No nuchal cord present. Shoulder and body delivered in usual fashion. Infant with spontaneous cry, placed on mother's abdomen, dried and stimulated. Cord clamped x 2 after 1-minute delay, and cut by FOB under my direct supervision. Cord gas and blood were drawn. Placenta delivered spontaneously with gentle cord traction via Schultz mechanism. Fundus firm with massage and Pitocin. Labia, perineum, vagina, and cervix were inspected, and a small perineal scrape was noted that was hemostatic and did not require repair.   Placenta: intact Complications: none Lacerations: perineal abrasion EBL: 204 Analgesia: Epidural  Postpartum Planning [x] message to sent to schedule follow-up  [ ] needs MMR  Infant: female  APGARs 8 & 9  weight per medical record  Hailey L Sparacino, DO OB/GYN Fellow, Faculty Practice        

## 2018-08-28 ENCOUNTER — Encounter: Payer: Medicaid Other | Admitting: Family Medicine

## 2018-09-04 ENCOUNTER — Encounter: Payer: Self-pay | Admitting: *Deleted

## 2018-09-04 ENCOUNTER — Other Ambulatory Visit (HOSPITAL_COMMUNITY)
Admission: RE | Admit: 2018-09-04 | Discharge: 2018-09-04 | Disposition: A | Discharge status: Home / Self Care | Payer: Medicaid Other | Source: Ambulatory Visit | Attending: Obstetrics & Gynecology | Admitting: Obstetrics & Gynecology

## 2018-09-04 ENCOUNTER — Ambulatory Visit (INDEPENDENT_AMBULATORY_CARE_PROVIDER_SITE_OTHER): Payer: Medicaid Other | Admitting: Obstetrics & Gynecology

## 2018-09-04 ENCOUNTER — Encounter: Payer: Self-pay | Admitting: Obstetrics & Gynecology

## 2018-09-04 VITALS — BP 121/73 | HR 73 | Wt 124.0 lb

## 2018-09-04 DIAGNOSIS — O09299 Supervision of pregnancy with other poor reproductive or obstetric history, unspecified trimester: Secondary | ICD-10-CM | POA: Diagnosis not present

## 2018-09-04 DIAGNOSIS — Z3A13 13 weeks gestation of pregnancy: Secondary | ICD-10-CM

## 2018-09-04 DIAGNOSIS — R87612 Low grade squamous intraepithelial lesion on cytologic smear of cervix (LGSIL): Secondary | ICD-10-CM | POA: Insufficient documentation

## 2018-09-04 DIAGNOSIS — O09899 Supervision of other high risk pregnancies, unspecified trimester: Secondary | ICD-10-CM | POA: Insufficient documentation

## 2018-09-04 DIAGNOSIS — Z3481 Encounter for supervision of other normal pregnancy, first trimester: Secondary | ICD-10-CM

## 2018-09-04 DIAGNOSIS — N879 Dysplasia of cervix uteri, unspecified: Secondary | ICD-10-CM | POA: Insufficient documentation

## 2018-09-04 DIAGNOSIS — Z3480 Encounter for supervision of other normal pregnancy, unspecified trimester: Secondary | ICD-10-CM | POA: Diagnosis not present

## 2018-09-04 DIAGNOSIS — Z3689 Encounter for other specified antenatal screening: Secondary | ICD-10-CM

## 2018-09-04 DIAGNOSIS — O09291 Supervision of pregnancy with other poor reproductive or obstetric history, first trimester: Secondary | ICD-10-CM

## 2018-09-04 DIAGNOSIS — O09891 Supervision of other high risk pregnancies, first trimester: Secondary | ICD-10-CM

## 2018-09-04 HISTORY — DX: Dysplasia of cervix uteri, unspecified: N87.9

## 2018-09-04 MED ORDER — ASPIRIN EC 81 MG PO TBEC: 81.0000 mg | DELAYED_RELEASE_TABLET | Freq: Every day | ORAL | 2 refills | Status: DC

## 2018-09-04 NOTE — Patient Instructions (Signed)
Return to office for any scheduled appointments. Call the office or go to the MAU at [facility] at [facility] if:  You begin to have strong, frequent contractions  Your water breaks.  Sometimes it is a big gush of fluid, sometimes it is just a trickle that keeps getting your panties wet or running down your legs  You have vaginal bleeding.  It is normal to have a small amount of spotting if your cervix was checked.   You do not feel your baby moving like normal (movement happens after 20 weeks).  Any other obstetric concerns.   Second Trimester of Pregnancy The second trimester is from week 14 through week 27 (months 4 through 6). The second trimester is often a time when you feel your best. Your body has adjusted to being pregnant, and you begin to feel better physically. Usually, morning sickness has lessened or quit completely, you may have more energy, and you may have an increase in appetite. The second trimester is also a time when the fetus is growing rapidly. At the end of the sixth month, the fetus is about 9 inches long and weighs about 1 pounds. You will likely begin to feel the baby move (quickening) between 16 and 20 weeks of pregnancy. Body changes during your second trimester Your body continues to go through many changes during your second trimester. The changes vary from woman to woman.  Your weight will continue to increase. You will notice your lower abdomen bulging out.  You may begin to get stretch marks on your hips, abdomen, and breasts.  You may develop headaches that can be relieved by medicines. The medicines should be approved by your health care provider.  You may urinate more often because the fetus is pressing on your bladder.  You may develop or continue to have heartburn as a result of your pregnancy.  You may develop constipation because certain hormones are causing the muscles that push waste through your intestines to slow down.  You  may develop hemorrhoids or swollen, bulging veins (varicose veins).  You may have back pain. This is caused by: ? Weight gain. ? Pregnancy hormones that are relaxing the joints in your pelvis. ? A shift in weight and the muscles that support your balance.  Your breasts will continue to grow and they will continue to become tender.  Your gums may bleed and may be sensitive to brushing and flossing.  Dark spots or blotches (chloasma, mask of pregnancy) may develop on your face. This will likely fade after the baby is born.  A dark line from your belly button to the pubic area (linea nigra) may appear. This will likely fade after the baby is born.  You may have changes in your hair. These can include thickening of your hair, rapid growth, and changes in texture. Some women also have hair loss during or after pregnancy, or hair that feels dry or thin. Your hair will most likely return to normal after your baby is born. What to expect at prenatal visits During a routine prenatal visit:  You will be weighed to make sure you and the fetus are growing normally.  Your blood pressure will be taken.  Your abdomen will be measured to track your baby's growth.  The fetal heartbeat will be listened to.  Any test results from the previous visit will be discussed. Your health care provider may ask you:  How you are feeling.  If you are feeling the  baby move.  If you have had any abnormal symptoms, such as leaking fluid, bleeding, severe headaches, or abdominal cramping.  If you are using any tobacco products, including cigarettes, chewing tobacco, and electronic cigarettes.  If you have any questions. Other tests that may be performed during your second trimester include:  Blood tests that check for: ? Low iron levels (anemia). ? High blood sugar that affects pregnant women (gestational diabetes) between 38 and 28 weeks. ? Rh antibodies. This is to check for a protein on red blood cells  (Rh factor).  Urine tests to check for infections, diabetes, or protein in the urine.  An ultrasound to confirm the proper growth and development of the baby.  An amniocentesis to check for possible genetic problems.  Fetal screens for spina bifida and Down syndrome.  HIV (human immunodeficiency virus) testing. Routine prenatal testing includes screening for HIV, unless you choose not to have this test. Follow these instructions at home: Medicines  Follow your health care provider's instructions regarding medicine use. Specific medicines may be either safe or unsafe to take during pregnancy.  Take a prenatal vitamin that contains at least 600 micrograms (mcg) of folic acid.  If you develop constipation, try taking a stool softener if your health care provider approves. Eating and drinking   Eat a balanced diet that includes fresh fruits and vegetables, whole grains, good sources of protein such as meat, eggs, or tofu, and low-fat dairy. Your health care provider will help you determine the amount of weight gain that is right for you.  Avoid raw meat and uncooked cheese. These carry germs that can cause birth defects in the baby.  If you have low calcium intake from food, talk to your health care provider about whether you should take a daily calcium supplement.  Limit foods that are high in fat and processed sugars, such as fried and sweet foods.  To prevent constipation: ? Drink enough fluid to keep your urine clear or pale yellow. ? Eat foods that are high in fiber, such as fresh fruits and vegetables, whole grains, and beans. Activity  Exercise only as directed by your health care provider. Most women can continue their usual exercise routine during pregnancy. Try to exercise for 30 minutes at least 5 days a week. Stop exercising if you experience uterine contractions.  Avoid heavy lifting, wear low heel shoes, and practice good posture.  A sexual relationship may be  continued unless your health care provider directs you otherwise. Relieving pain and discomfort  Wear a good support bra to prevent discomfort from breast tenderness.  Take warm sitz baths to soothe any pain or discomfort caused by hemorrhoids. Use hemorrhoid cream if your health care provider approves.  Rest with your legs elevated if you have leg cramps or low back pain.  If you develop varicose veins, wear support hose. Elevate your feet for 15 minutes, 3-4 times a day. Limit salt in your diet. Prenatal Care  Write down your questions. Take them to your prenatal visits.  Keep all your prenatal visits as told by your health care provider. This is important. Safety  Wear your seat belt at all times when driving.  Make a list of emergency phone numbers, including numbers for family, friends, the hospital, and police and fire departments. General instructions  Ask your health care provider for a referral to a local prenatal education class. Begin classes no later than the beginning of month 6 of your pregnancy.  Ask for help  if you have counseling or nutritional needs during pregnancy. Your health care provider can offer advice or refer you to specialists for help with various needs.  Do not use hot tubs, steam rooms, or saunas.  Do not douche or use tampons or scented sanitary pads.  Do not cross your legs for long periods of time.  Avoid cat litter boxes and soil used by cats. These carry germs that can cause birth defects in the baby and possibly loss of the fetus by miscarriage or stillbirth.  Avoid all smoking, herbs, alcohol, and unprescribed drugs. Chemicals in these products can affect the formation and growth of the baby.  Do not use any products that contain nicotine or tobacco, such as cigarettes and e-cigarettes. If you need help quitting, ask your health care provider.  Visit your dentist if you have not gone yet during your pregnancy. Use a soft toothbrush to brush  your teeth and be gentle when you floss. Contact a health care provider if:  You have dizziness.  You have mild pelvic cramps, pelvic pressure, or nagging pain in the abdominal area.  You have persistent nausea, vomiting, or diarrhea.  You have a bad smelling vaginal discharge.  You have pain when you urinate. Get help right away if:  You have a fever.  You are leaking fluid from your vagina.  You have spotting or bleeding from your vagina.  You have severe abdominal cramping or pain.  You have rapid weight gain or weight loss.  You have shortness of breath with chest pain.  You notice sudden or extreme swelling of your face, hands, ankles, feet, or legs.  You have not felt your baby move in over an hour.  You have severe headaches that do not go away when you take medicine.  You have vision changes. Summary  The second trimester is from week 14 through week 27 (months 4 through 6). It is also a time when the fetus is growing rapidly.  Your body goes through many changes during pregnancy. The changes vary from woman to woman.  Avoid all smoking, herbs, alcohol, and unprescribed drugs. These chemicals affect the formation and growth your baby.  Do not use any tobacco products, such as cigarettes, chewing tobacco, and e-cigarettes. If you need help quitting, ask your health care provider.  Contact your health care provider if you have any questions. Keep all prenatal visits as told by your health care provider. This is important. This information is not intended to replace advice given to you by your health care provider. Make sure you discuss any questions you have with your health care provider. Document Released: 06/13/2001 Document Revised: 07/25/2016 Document Reviewed: 07/25/2016 Elsevier Interactive Patient Education  2019 [employer].

## 2018-09-04 NOTE — Progress Notes (Signed)
History:   Nicole Mcmahon is a 23 y.o. G2P1001 at [redacted]w[redacted]d by LMP, early ultrasound being seen today for her first obstetrical visit.  Her obstetrical history is significant for term SVD in 05/2017 complicated by [facility] that progressed to postpartum severe preeclampsia. Patient does intend to breast feed. Pregnancy history fully reviewed.  Patient reports no complaints. Has different FOB this pregnancy.      HISTORY: OB History  Gravida Para Term Preterm AB Living  2 1 1  0 0 1  SAB TAB Ectopic Multiple Live Births  0 0 0 0 1    # Outcome Date GA Lbr Len/2nd Weight Sex Delivery Anes PTL Lv  2 Current           1 Term 05/04/17 [redacted]w[redacted]d 23:05 / 00:32 6 lb 3 oz (2.807 kg) M Vag-Spont EPI  LIV     Name: Nicole Mcmahon     Apgar1: 9  Apgar5: 9  Never had a pap smear.  Past Medical History:  Diagnosis Date  . Asthma   . Blood dyscrasia   . H/O genital injury    Childhood injury where she jumped and landed on a stick that went into her vagina.   . Preeclampsia in postpartum period 05/10/2017  . Pregnancy induced hypertension   . Sickle cell trait (HCC)    History reviewed. No pertinent surgical history. History reviewed. No pertinent family history. Social History   Tobacco Use  . Smoking status: Never Smoker  . Smokeless tobacco: Never Used  Substance Use Topics  . Alcohol use: No  . Drug use: No   No Known Allergies Current Outpatient Medications on File Prior to Visit  Medication Sig Dispense Refill  . ferrous sulfate 325 (65 FE) MG tablet Take 1 tablet (325 mg total) by mouth daily with breakfast. 30 tablet 3  . Prenat w/o A-FeCbGl-[id number]-FA-DHA (CITRANATAL 90 DHA) 90-1 & 300 MG MISC Take 1 tablet by mouth daily. 30 each 12   No current facility-administered medications on file prior to visit.     Review of Systems Pertinent items noted in HPI and remainder of comprehensive ROS otherwise negative. Physical Exam:   Vitals:   09/04/18 1554  BP: 121/73  Pulse: 73    Weight: 124 lb (56.2 kg)   Fetal Heart Rate (bpm): 146 bpm on bedside ultrasound** Uterus:  Fundal Height: 13 cm  Pelvic Exam: Perineum: no hemorrhoids, normal perineum   Vulva: normal external genitalia, no lesions   Vagina:  normal mucosa, normal discharge   Cervix: no lesions and normal, pap smear done.    Adnexa: normal adnexa and no mass, fullness, tenderness   Bony Pelvis: average  System: General: well-developed, well-nourished female in no acute distress   Breasts:  normal appearance, no masses or tenderness bilaterally   Skin: normal coloration and turgor, no rashes   Neurologic: oriented, normal, negative, normal mood   Extremities: normal strength, tone, and muscle mass, ROM of all joints is normal   HEENT PERRLA, extraocular movement intact and sclera clear, anicteric   Mouth/Teeth mucous membranes moist, pharynx normal without lesions and dental hygiene good   Neck supple and no masses   Cardiovascular: regular rate and rhythm   Respiratory:  no respiratory distress, normal breath sounds   Abdomen: soft, non-tender; bowel sounds normal; no masses,  no organomegaly  **Bedside Ultrasound for FHR check: Patient informed that the ultrasound is considered a limited obstetric ultrasound and is not intended to be a complete ultrasound exam.  Patient also informed that the ultrasound is not being completed with the intent of assessing for fetal or placental anomalies or any pelvic abnormalities.  Explained that the purpose of today's ultrasound is to assess for fetal heart rate.  Patient acknowledges the purpose of the exam and the limitations of the study.     Assessment:    Pregnancy: G2P1001 Patient Active Problem List   Diagnosis Date Noted  . Short interval between pregnancies affecting pregnancy, antepartum 09/04/2018  . History of pospartum preeclampsia after antepartum GHTN in prior pregnancy, currently pregnant 05/10/2017  . Supervision of other normal pregnancy,  antepartum 01/17/2017     Plan:    1. History of pospartum severe preeclampsia after antepartum GHTN in prior pregnancy, currently pregnant Aspirin recommended and prescribed. Will check baseline labs. Monitor BP closely.  - aspirin EC 81 MG tablet; Take 1 tablet (81 mg total) by mouth daily. Take after 12 weeks for prevention of preeclampsia later in pregnancy  Dispense: 300 tablet; Refill: 2 - Comprehensive metabolic panel - Protein / creatinine ratio, urine  2. Short interval between pregnancies affecting pregnancy, antepartum Will discuss contraception later in pregnancy.  3. Encounter for fetal anatomic survey Anatomy scan ordered - US MFM OB COMP + 14 WK; Future  4. Supervision of other normal pregnancy, antepartum Initial labs drawn. - Obstetric Panel, Including HIV - Culture, OB Urine - Cytology - PAP - Genetic Screening - Enroll Patient in Babyscripts - [place] MFM OB COMP + 14 WK; Future Continue prenatal vitamins. Genetic Screening discussed, NIPS: ordered. Problem list reviewed and updated. The nature of [facility] Faculty Practice with multiple MDs and other Advanced Practice Providers was explained to patient; also emphasized that residents, students are part of our team. Routine obstetric precautions reviewed. Return in about 4 weeks (around 10/02/2018) for OB Visit.     Jaynie Collins, MD, FACOG Obstetrician & Gynecologist, [facility] for [employer], [facility]

## 2018-09-05 LAB — OBSTETRIC PANEL, INCLUDING HIV
Antibody Screen: NEGATIVE
Basophils Absolute: 0 10*3/uL (ref 0.0–0.2)
Basos: 0 %
EOS (ABSOLUTE): 0.2 10*3/uL (ref 0.0–0.4)
EOS: 2 %
HEMOGLOBIN: 11.9 g/dL (ref 11.1–15.9)
HIV Screen 4th Generation wRfx: NONREACTIVE
Hematocrit: 35.1 % (ref 34.0–46.6)
Hepatitis B Surface Ag: NEGATIVE
Immature Grans (Abs): 0 10*3/uL (ref 0.0–0.1)
Immature Granulocytes: 0 %
Lymphocytes Absolute: 2.9 10*3/uL (ref 0.7–3.1)
Lymphs: 23 %
MCH: 31.2 pg (ref 26.6–33.0)
MCHC: 33.9 g/dL (ref 31.5–35.7)
MCV: 92 fL (ref 79–97)
Monocytes Absolute: 0.6 10*3/uL (ref 0.1–0.9)
Monocytes: 5 %
Neutrophils Absolute: 8.9 10*3/uL — ABNORMAL HIGH (ref 1.4–7.0)
Neutrophils: 70 %
Platelets: 359 10*3/uL (ref 150–450)
RBC: 3.82 x10E6/uL (ref 3.77–5.28)
RDW: 12.3 % (ref 11.7–15.4)
RH TYPE: POSITIVE
RPR Ser Ql: NONREACTIVE
Rubella Antibodies, IGG: <0.9 index — ABNORMAL LOW (ref >0.99)
WBC: 12.7 10*3/uL — AB (ref 3.4–10.8)

## 2018-09-05 LAB — PROTEIN / CREATININE RATIO, URINE
Creatinine, Urine: 254.9 mg/dL
PROTEIN/CREAT RATIO: 106 mg/g{creat} (ref 0–200)
Protein, Ur: 26.9 mg/dL

## 2018-09-05 LAB — COMPREHENSIVE METABOLIC PANEL
ALT: 12 IU/L (ref 0–32)
AST: 16 IU/L (ref 0–40)
Albumin/Globulin Ratio: 1.7 (ref 1.2–2.2)
Albumin: 4.4 g/dL (ref 3.9–5.0)
Alkaline Phosphatase: 61 IU/L (ref 39–117)
BUN/Creatinine Ratio: 14 (ref 9–23)
BUN: 7 mg/dL (ref 6–20)
Bilirubin Total: 0.5 mg/dL (ref 0.0–1.2)
CO2: 20 mmol/L (ref 20–29)
Calcium: 9.3 mg/dL (ref 8.7–10.2)
Chloride: 104 mmol/L (ref 96–106)
Creatinine, Ser: 0.49 mg/dL — ABNORMAL LOW (ref 0.57–1.00)
GFR calc Af Amer: 160 mL/min/{1.73_m2} (ref >59)
GFR, EST NON AFRICAN AMERICAN: 139 mL/min/{1.73_m2} (ref >59)
Globulin, Total: 2.6 g/dL (ref 1.5–4.5)
Glucose: 70 mg/dL (ref 65–99)
Potassium: 3.8 mmol/L (ref 3.5–5.2)
Sodium: 137 mmol/L (ref 134–144)
Total Protein: 7 g/dL (ref 6.0–8.5)

## 2018-09-06 LAB — CULTURE, OB URINE

## 2018-09-06 LAB — URINE CULTURE, OB REFLEX

## 2018-09-10 LAB — CYTOLOGY - PAP
Chlamydia: NEGATIVE
NEISSERIA GONORRHEA: NEGATIVE
Trichomonas: NEGATIVE

## 2018-09-11 ENCOUNTER — Encounter: Payer: Self-pay | Admitting: Obstetrics & Gynecology

## 2018-09-12 ENCOUNTER — Encounter: Payer: Self-pay | Admitting: *Deleted

## 2018-09-12 ENCOUNTER — Telehealth: Payer: Self-pay | Admitting: *Deleted

## 2018-09-12 NOTE — Telephone Encounter (Signed)
Informed of panorama results.

## 2018-09-16 ENCOUNTER — Encounter: Payer: Self-pay | Admitting: Radiology

## 2018-10-01 ENCOUNTER — Telehealth: Payer: Self-pay | Admitting: *Deleted

## 2018-10-01 NOTE — Telephone Encounter (Signed)
Called patient to make her aware of appointment on 4/1 with be a phone visit. Pt does not have access to a BP cuff, so she will come by office to pick one up and she will be added to babyscripts optimization.   Scheryl Marten, RN

## 2018-10-02 ENCOUNTER — Other Ambulatory Visit: Payer: Self-pay

## 2018-10-02 ENCOUNTER — Ambulatory Visit (INDEPENDENT_AMBULATORY_CARE_PROVIDER_SITE_OTHER): Payer: Medicaid Other | Admitting: Family Medicine

## 2018-10-02 ENCOUNTER — Encounter: Payer: Self-pay | Admitting: Family Medicine

## 2018-10-02 VITALS — BP 118/78 | HR 82

## 2018-10-02 DIAGNOSIS — O09892 Supervision of other high risk pregnancies, second trimester: Secondary | ICD-10-CM

## 2018-10-02 DIAGNOSIS — O09299 Supervision of pregnancy with other poor reproductive or obstetric history, unspecified trimester: Secondary | ICD-10-CM

## 2018-10-02 DIAGNOSIS — Z3480 Encounter for supervision of other normal pregnancy, unspecified trimester: Secondary | ICD-10-CM

## 2018-10-02 DIAGNOSIS — R87612 Low grade squamous intraepithelial lesion on cytologic smear of cervix (LGSIL): Secondary | ICD-10-CM | POA: Diagnosis not present

## 2018-10-02 DIAGNOSIS — O09899 Supervision of other high risk pregnancies, unspecified trimester: Secondary | ICD-10-CM

## 2018-10-02 DIAGNOSIS — Z3A17 17 weeks gestation of pregnancy: Secondary | ICD-10-CM | POA: Diagnosis not present

## 2018-10-02 NOTE — Progress Notes (Signed)
   TELEHEALTH VIRTUAL OBSTETRICS VISIT ENCOUNTER NOTE  I connected with Nicole Mcmahon on 10/02/18 at  9:15 AM EDT by telephone at home and verified that I am speaking with the correct person using two identifiers.   I discussed the limitations, risks, security and privacy concerns of performing an evaluation and management service by telephone and the availability of in person appointments. I also discussed with the patient that there may be a patient responsible charge related to this service. The patient expressed understanding and agreed to proceed.  Subjective:  Nicole Mcmahon is a 23 y.o. G2P1001 at [redacted]w[redacted]d being followed for ongoing prenatal care.  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  Patient reports no complaints. Reports fetal movement. Denies any contractions, bleeding or leaking of fluid.   The following portions of the patient's history were reviewed and updated as appropriate: allergies, current medications, past family history, past medical history, past social history, past surgical history and problem list.   Objective:   General:  Alert, oriented and cooperative.   Mental Status: Normal mood and affect perceived. Normal judgment and thought content.  Rest of physical exam deferred due to type of encounter  Assessment and Plan:  Pregnancy: G2P1001 at [redacted]w[redacted]d  1. Supervision of other normal pregnancy, antepartum Up to date  2. History of pospartum preeclampsia after antepartum GHTN in prior pregnancy, currently pregnant BP wnl today  3. Short interval between pregnancies affecting pregnancy, antepartum NO sx  of PTL  4. 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  I discussed the assessment and treatment plan with the patient. The patient was provided an opportunity to ask questions and all were answered. The patient agreed with the plan and demonstrated an understanding of the instructions. The patient was advised to call back or seek an in-person office evaluation/go to MAU at [facility] for any urgent or concerning symptoms. Please refer to After Visit Summary for other counseling recommendations.   I provided 13 minutes of non-face-to-face time during this encounter.  No follow-ups on file.  Future Appointments  Date Time Provider Department Center  10/16/2018 10:15 AM WH-MFC [place] 4 WH-MFCUS MFC-US   Federico Flake, MD Center for [facility], [facility]

## 2018-10-16 ENCOUNTER — Other Ambulatory Visit: Payer: Self-pay

## 2018-10-16 ENCOUNTER — Ambulatory Visit (HOSPITAL_COMMUNITY)
Admission: RE | Admit: 2018-10-16 | Discharge: 2018-10-16 | Disposition: A | Discharge status: Home / Self Care | Payer: Medicaid Other | Source: Ambulatory Visit | Attending: Obstetrics and Gynecology | Admitting: Obstetrics and Gynecology

## 2018-10-16 DIAGNOSIS — Z3A19 19 weeks gestation of pregnancy: Secondary | ICD-10-CM | POA: Diagnosis not present

## 2018-10-16 DIAGNOSIS — O09892 Supervision of other high risk pregnancies, second trimester: Secondary | ICD-10-CM | POA: Diagnosis not present

## 2018-10-16 DIAGNOSIS — Z363 Encounter for antenatal screening for malformations: Secondary | ICD-10-CM

## 2018-10-16 DIAGNOSIS — Z3689 Encounter for other specified antenatal screening: Secondary | ICD-10-CM | POA: Diagnosis not present

## 2018-10-16 DIAGNOSIS — Z3480 Encounter for supervision of other normal pregnancy, unspecified trimester: Secondary | ICD-10-CM | POA: Diagnosis not present

## 2018-10-28 ENCOUNTER — Telehealth: Payer: Self-pay | Admitting: *Deleted

## 2018-10-28 NOTE — Telephone Encounter (Signed)
-----   Message from Rozann Lesches, NT sent at 10/28/2018  2:52 PM EDT ----- Regarding: patient is having headaches Patient called and states that she is having headaches and wants to know what she can do

## 2018-10-28 NOTE — Telephone Encounter (Signed)
attempted to call pt back in regards to her message. Will try again later.

## 2018-10-30 ENCOUNTER — Encounter: Payer: Medicaid Other | Admitting: Family Medicine

## 2018-10-30 ENCOUNTER — Telehealth: Payer: Self-pay | Admitting: *Deleted

## 2018-10-30 NOTE — Telephone Encounter (Signed)
-----   Message from Heather B Walker, NT sent at 10/28/2018  2:52 PM EDT ----- Regarding: patient is having headaches Patient called and states that she is having headaches and wants to know what she can do   

## 2018-10-30 NOTE — Telephone Encounter (Signed)
Discussed with patient the mediations safe to take for headaches and she felt like she has been getting a cold or has allergies. So I  discussed the meds safe for her to take for that as well. Pt verbalizes.

## 2018-10-31 ENCOUNTER — Other Ambulatory Visit: Payer: Self-pay

## 2018-10-31 ENCOUNTER — Ambulatory Visit (INDEPENDENT_AMBULATORY_CARE_PROVIDER_SITE_OTHER): Payer: Medicaid Other | Admitting: Obstetrics and Gynecology

## 2018-10-31 VITALS — Wt 132.0 lb

## 2018-10-31 DIAGNOSIS — R51 Headache: Secondary | ICD-10-CM | POA: Diagnosis not present

## 2018-10-31 DIAGNOSIS — Z3A21 21 weeks gestation of pregnancy: Secondary | ICD-10-CM

## 2018-10-31 DIAGNOSIS — R519 Headache, unspecified: Secondary | ICD-10-CM | POA: Insufficient documentation

## 2018-10-31 DIAGNOSIS — Z3480 Encounter for supervision of other normal pregnancy, unspecified trimester: Secondary | ICD-10-CM

## 2018-10-31 DIAGNOSIS — O26899 Other specified pregnancy related conditions, unspecified trimester: Secondary | ICD-10-CM

## 2018-10-31 DIAGNOSIS — O26892 Other specified pregnancy related conditions, second trimester: Secondary | ICD-10-CM

## 2018-10-31 MED ORDER — MAGNESIUM MALATE 1250 (141.7 MG) MG PO TABS: 1.0000 | ORAL_TABLET | Freq: Every day | ORAL | 6 refills | Status: DC | PRN

## 2018-10-31 NOTE — Progress Notes (Signed)
   TELEHEALTH VIRTUAL OBSTETRICS VISIT ENCOUNTER NOTE  I connected with Nicole Mcmahon on 10/31/18 at 10:45 AM EDT by telephone at home and verified that I am speaking with the correct person using two identifiers.   I discussed the limitations, risks, security and privacy concerns of performing an evaluation and management service by telephone and the availability of in person appointments. I also discussed with the patient that there may be a patient responsible charge related to this service. The patient expressed understanding and agreed to proceed.  Subjective:  Nicole Mcmahon is a 23 y.o. G2P1001 at [redacted]w[redacted]d being followed for ongoing prenatal care.  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  Patient reports headache. Reports fetal movement. Denies any contractions, bleeding or leaking of fluid.   The following portions of the patient's history were reviewed and updated as appropriate: allergies, current medications, past family history, past medical history, past social history, past surgical history and problem list.   Objective:   Vitals:   10/31/18 1056  Weight: 132 lb (59.9 kg)    General:  Alert, oriented and cooperative.   Mental Status: Normal mood and affect perceived. Normal judgment and thought content.  Rest of physical exam deferred due to type of encounter  Assessment and Plan:  Pregnancy: G2P1001 at [redacted]w[redacted]d 1. Supervision of other normal pregnancy, antepartum  PO Mg for occasional HAs. If not, pt told to let us know if not helped and will need in person visit.    Preterm labor  symptoms and general obstetric precautions including but not limited to vaginal bleeding, contractions, leaking of fluid and fetal movement were reviewed in detail with the patient.  I discussed the assessment and treatment plan with the patient. The patient was provided an opportunity to ask questions and all were answered. The patient agreed with the plan and demonstrated an understanding of the instructions. The patient was advised to call back or seek an in-person office evaluation/go to MAU at [facility] for any urgent or concerning symptoms. Please refer to After Visit Summary for other counseling recommendations.   I provided 10 minutes of non-face-to-face time during this encounter. The visit was conducted via Webex-medicine  Return in about 7 weeks (around 12/19/2018) for in person. rob. 2h GTT.  No future appointments.  Dec[place] Bing, MD Center for [employer], [facility]

## 2018-10-31 NOTE — Progress Notes (Signed)
I connected with  Nicole Mcmahon on 10/31/18 at 10:45 AM EDT by telephone and verified that I am speaking with the correct person using two identifiers.   I discussed the limitations, risks, security and privacy concerns of performing an evaluation and management service by telephone and the availability of in person appointments. I also discussed with the patient that there may be a patient responsible charge related to this service. The patient expressed understanding and agreed to proceed.  Scheryl Marten, RN 10/31/2018  10:55 AM   Pt having issues with getting headaches

## 2018-11-12 ENCOUNTER — Telehealth: Payer: Self-pay | Admitting: *Deleted

## 2018-11-12 NOTE — Telephone Encounter (Signed)
Pt called concerned about spotting she is having after intercourse. She wanted to know if it was normal or does she need to be seen. Informed pt that sexual intercourse can cause some irritation and cause some spotting. Pt states the spotting only happens right after and does not have any bleeding, pain or cramping or any spotting between sex. Instructed pt to continue to monitor and if it becomes worse or has bleeding or cramping to call the office.

## 2018-11-21 ENCOUNTER — Encounter: Payer: Self-pay | Admitting: Obstetrics & Gynecology

## 2018-11-21 ENCOUNTER — Other Ambulatory Visit (HOSPITAL_COMMUNITY)
Admission: RE | Admit: 2018-11-21 | Discharge: 2018-11-21 | Disposition: A | Discharge status: Home / Self Care | Payer: Medicaid Other | Source: Ambulatory Visit | Attending: Obstetrics & Gynecology | Admitting: Obstetrics & Gynecology

## 2018-11-21 ENCOUNTER — Ambulatory Visit (INDEPENDENT_AMBULATORY_CARE_PROVIDER_SITE_OTHER): Payer: Medicaid Other | Admitting: Obstetrics & Gynecology

## 2018-11-21 ENCOUNTER — Other Ambulatory Visit: Payer: Self-pay

## 2018-11-21 VITALS — BP 112/72 | HR 103 | Wt 145.0 lb

## 2018-11-21 DIAGNOSIS — Z3A24 24 weeks gestation of pregnancy: Secondary | ICD-10-CM

## 2018-11-21 DIAGNOSIS — O4692 Antepartum hemorrhage, unspecified, second trimester: Secondary | ICD-10-CM

## 2018-11-21 DIAGNOSIS — N86 Erosion and ectropion of cervix uteri: Secondary | ICD-10-CM | POA: Insufficient documentation

## 2018-11-21 DIAGNOSIS — B9689 Other specified bacterial agents as the cause of diseases classified elsewhere: Secondary | ICD-10-CM

## 2018-11-21 DIAGNOSIS — N760 Acute vaginitis: Secondary | ICD-10-CM

## 2018-11-21 DIAGNOSIS — Z3480 Encounter for supervision of other normal pregnancy, unspecified trimester: Secondary | ICD-10-CM

## 2018-11-21 DIAGNOSIS — O98512 Other viral diseases complicating pregnancy, second trimester: Secondary | ICD-10-CM

## 2018-11-21 HISTORY — DX: Erosion and ectropion of cervix uteri: N86

## 2018-11-21 NOTE — Progress Notes (Signed)
Pt has had some spotting after intercourse, but then saturday she had some spotting but none since then

## 2018-11-21 NOTE — Progress Notes (Signed)
PRENATAL VISIT NOTE  Subjective:  Nicole Mcmahon is a 23 y.o. G2P1001 at 593w1d being seen today for ongoing prenatal care.  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  Patient reports bleeding/spotting noted after intercourse. She and her husband are worried. No heavy bleeding noted.  Contractions: Not present. Vag. Bleeding: None.  Movement: Present. Denies leaking of fluid.   The following portions of the patient's history were reviewed and updated as appropriate: allergies, current medications, past family history, past medical history, past social history, past surgical history and problem list.   Objective:   Vitals:   11/21/18 1011  BP: 112/72  Pulse: (!) 103  Weight: 145 lb (65.8 kg)    Fetal Status: Fetal Heart Rate (bpm): 148   Movement: Present     General:  Alert, oriented and cooperative. Patient is in no acute distress.  Skin: Skin is warm and dry. No rash noted.   Cardiovascular: Normal heart rate noted  Respiratory: Normal respiratory effort, no problems with respiration noted  Abdomen: Soft, gravid, appropriate for gestational age.  Pain/Pressure: Absent     Pelvic: Cervical exam performed. Small ectropion noted. No active bleeding. Scant clear discharge note, testing sample obtained        Extremities: Normal range of motion.  Edema: None  Mental Status: Normal mood and affect. Normal behavior. Normal judgment and thought content.   Imaging: Koreas Mfm Ob Comp + 14 Wk  Result Date: 10/16/2018 ----------------------------------------------------------------------  OBSTETRICS  REPORT                       (Signed Final 10/16/2018 02:25 pm) ---------------------------------------------------------------------- Patient Info  ID #:       [id number]                          D.O.B.:  06-Feb-1996 (22 yrs)  Name:       Nicole MartesKABRERA Caamano                  Visit Date: 10/16/2018 11:41 am ---------------------------------------------------------------------- Performed By  Performed By:     Ellin SabaSusan M Kennedy        Ref. Address:     [address]                    [address]                                                             [address]                                                             [place], [place]  [address]  Attending:        Lin Landsman      [facility]                    MD                                       [facility]  Referred By:      Tereso Newcomer MD ---------------------------------------------------------------------- Orders   #  Description                          Code         Ordered By   1  [place] MFM OB COMP + 14 WK               76805.01     Ellerie Arenz  ----------------------------------------------------------------------   #  Order #                    Accession #                 Episode #   1  [id number]                  [id number]                  [id number]  ---------------------------------------------------------------------- Indications   Encounter for antenatal screening for          Z36.3   malformations   Short interval between pregancies, 2nd         O09.892   trimester   Low risk NIPS   Severe post partum preclampsia   [redacted] weeks gestation of pregnancy                Z3A.19  ---------------------------------------------------------------------- Fetal Evaluation  Num Of Fetuses:         1  Fetal Heart Rate(bpm):  142  Cardiac Activity:       Observed  Placenta:               Posterior  P. Cord Insertion:      Visualized, central  Amniotic Fluid  AFI FV:       Within normal limits                              Largest Pocket(cm)                              5.32 ---------------------------------------------------------------------- Biometry  BPD:      44.8  mm     G. Age:  19w 4d         74  %    CI:        73.91   %    70 - 86  FL/HC:      19.0   %    16.1 - 18.3  HC:      165.5  mm     G. Age:  19w 2d         55  %    HC/AC:      1.10        1.09 - 1.39  AC:      149.8  mm     G. Age:  20w 1d         82  %    FL/BPD:     70.3   %  FL:       31.5  mm     G. Age:  19w 6d         71  %    FL/AC:      21.0   %    20 - 24  HUM:        31  mm     G. Age:  20w 2d         84  %  CER:      19.4  mm     G. Age:  18w 5d         41  %  NFT:       3.3  mm  LV:        7.3  mm  CM:        3.3  mm  Est. FW:     322  gm    0 lb 11 oz      59  % ---------------------------------------------------------------------- OB History  Gravidity:    2         Term:   1  Living:       1 ---------------------------------------------------------------------- Gestational Age  LMP:           19w 0d        Date:  06/05/18                 EDD:   03/12/19  U/S Today:     19w 5d                                        EDD:   03/07/19  Best:          19w 0d     Det. By:  LMP  (06/05/18)          EDD:   03/12/19 ---------------------------------------------------------------------- Anatomy  Cranium:               Appears normal         LVOT:                   Appears normal  Cavum:                 Appears normal         Aortic Arch:            Appears normal  Ventricles:            Appears normal         Ductal Arch:            Appears normal  Choroid Plexus:        Appears normal         Diaphragm:  Appears normal  Cerebellum:            Appears normal         Stomach:                Appears normal, left                                                                        sided  Posterior Fossa:       Appears normal         Abdomen:                 Appears normal  Nuchal Fold:           Appears normal         Abdominal Wall:         Appears nml (cord                                                                        insert, abd wall)  Face:                  Appears normal         Cord Vessels:           Appears normal (3                         (orbits and profile)                           vessel cord)  Lips:                  Appears normal         Kidneys:                Appear normal  Palate:                Appears normal         Bladder:                Appears normal  Thoracic:              Appears normal         Spine:                  Appears normal  Heart:                 Appears normal         Upper Extremities:      Appears normal                         (4CH, axis, and                         situs)  RVOT:  Appears normal         Lower Extremities:      Appears normal  Other:  Female gender. Heels and 5th digit visualized. Nasal bone visualized. ---------------------------------------------------------------------- Cervix Uterus Adnexa  Cervix  Length:            3.8  cm.  Normal appearance by transabdominal scan.  Left Ovary  Within normal limits.  Right Ovary  Within normal limits. ---------------------------------------------------------------------- Impression  Normal interval growth.  No ultrasonic evidence of structural  fetal anomalies. ---------------------------------------------------------------------- Recommendations  Follow up growth as clinically indicated. ----------------------------------------------------------------------               Lin Landsman, MD Electronically Signed Final Report   10/16/2018 02:25 pm ----------------------------------------------------------------------   Assessment and Plan:  Pregnancy: G2P1001 at [redacted]w[redacted]d 1. Vaginal bleeding in pregnancy, second trimester 2. Cervical ectropion Discussed that cervical ectropion is the most common cause of bleeding during the last months  of pregnancy. The reason for these symptoms is that glandular cells (which are exposed on the surface) are more delicate than epithelial cells. They produce more mucus and tend to bleed easily especially during intercourse.  Patient reassured. Will also follow up test results to evaluate for other etiologies. - Cervicovaginal ancillary only( [place])  3. Supervision of other normal pregnancy, antepartum Normal anatomy scan. Preterm labor symptoms and general obstetric precautions including but not limited to vaginal bleeding, contractions, leaking of fluid and fetal movement were reviewed in detail with the patient. Please refer to After Visit Summary for other counseling recommendations.   Return in about 4 weeks (around 12/19/2018) for 2 hr GTT, 3rd trimester labs, TDap, OFFICE OB Visit.  Future Appointments  Date Time Provider Department Center  12/19/2018  9:00 AM Zamirah Denny, Jethro Bastos, MD [facility]    Jaynie Collins, MD

## 2018-11-21 NOTE — Patient Instructions (Addendum)
Cervical ectropion is the most common cause of bleeding during the last months of pregnancy. The reason for these symptoms is that glandular cells are more delicate than epithelial cells. They produce more mucus and tend to bleed easily.      TDaP Vaccine Pregnancy Get the Whooping Cough Vaccine While You Are Pregnant (CDC)  It is important for women to get the whooping cough vaccine in the third trimester of each pregnancy. Vaccines are the best way to prevent this disease. There are 2 different whooping cough vaccines. Both vaccines combine protection against whooping cough, tetanus and diphtheria, but they are for different age groups: Tdap: for everyone 11 years or older, including pregnant women  DTaP: for children 2 months through 23 years of age  You need the whooping cough vaccine during each of your pregnancies The recommended time to get the shot is during your 27th through 36th week of pregnancy, preferably during the earlier part of this time period. The Centers for Disease Control and Prevention (CDC) recommends that pregnant women receive the whooping cough vaccine for adolescents and adults (called Tdap vaccine) during the third trimester of each pregnancy. The recommended time to get the shot is during your 27th through 36th week of pregnancy, preferably during the earlier part of this time period. This replaces the original recommendation that pregnant women get the vaccine only if they had not previously received it. The [employer] and the [employer] support this recommendation.  You should get the whooping cough vaccine while pregnant to pass protection to your baby frame support disabled and/or not supported in this browser  Learn why Nicole Mcmahon decided to get the whooping cough vaccine in her 3rd trimester of pregnancy and how her baby girl was born with some protection against the disease. Also available on YouTube.  After receiving the whooping cough vaccine, your body will create protective antibodies (proteins produced by the body to fight off diseases) and pass some of them to your baby before birth. These antibodies provide your baby some short-term protection against whooping cough in early life. These antibodies can also protect your baby from some of the more serious complications that come along with whooping cough. Your protective antibodies are at their highest about 2 weeks after getting the vaccine, but it takes time to pass them to your baby. So the preferred time to get the whooping cough vaccine is early in your third trimester. The amount of whooping cough antibodies in your body decreases over time. That is why CDC recommends you get a whooping cough vaccine during each pregnancy. Doing so allows each of your babies to get the greatest number of protective antibodies from you. This means each of your babies will get the best protection possible against this disease.  Getting the whooping cough vaccine while pregnant is better than getting the vaccine after you give birth Whooping cough vaccination during pregnancy is ideal so your baby will have short-term protection as soon as he is born. This early protection is important because your baby will not start getting his whooping cough vaccines until he is 2 months old. These first few months of life are when your baby is at greatest risk for catching whooping cough. This is also when he's at greatest risk for having severe, potentially life-threating complications from the infection. To avoid that gap in protection, it is best to get a whooping cough vaccine during pregnancy. You will then pass protection to your  baby before he is born. To continue protecting your baby, he should get whooping cough vaccines starting at 2 months old. You may never have gotten the Tdap vaccine before and did not get it during this pregnancy. If so, you should make sure to  get the vaccine immediately after you give birth, before leaving the hospital or birthing center. It will take about 2 weeks before your body develops protection (antibodies) in response to the vaccine. Once you have protection from the vaccine, you are less likely to give whooping cough to your newborn while caring for him. But remember, your baby will still be at risk for catching whooping cough from others. A recent study looked to see how effective Tdap was at preventing whooping cough in babies whose mothers got the vaccine while pregnant or in the hospital after giving birth. The study found that getting Tdap between 27 through 36 weeks of pregnancy is 85% more effective at preventing whooping cough in babies younger than 2 months old. Blood tests cannot tell if you need a whooping cough vaccine There are no blood tests that can tell you if you have enough antibodies in your body to protect yourself or your baby against whooping cough. Even if you have been sick with whooping cough in the past or previously received the vaccine, you still should get the vaccine during each pregnancy. Breastfeeding may pass some protective antibodies onto your baby By breastfeeding, you may pass some antibodies you have made in response to the vaccine to your baby. When you get a whooping cough vaccine during your pregnancy, you will have antibodies in your breast milk that you can share with your baby as soon as your milk comes in. However, your baby will not get protective antibodies immediately if you wait to get the whooping cough vaccine until after delivering your baby. This is because it takes about 2 weeks for your body to create antibodies. Learn more about the health benefits of breastfeeding.

## 2018-11-22 LAB — CERVICOVAGINAL ANCILLARY ONLY
Bacterial vaginitis: POSITIVE — AB
Candida vaginitis: NEGATIVE
Chlamydia: NEGATIVE
Neisseria Gonorrhea: NEGATIVE
Trichomonas: NEGATIVE

## 2018-11-23 MED ORDER — METRONIDAZOLE 500 MG PO TABS: 500.0000 mg | ORAL_TABLET | Freq: Two times a day (BID) | ORAL | 0 refills | Status: DC

## 2018-11-23 NOTE — Addendum Note (Signed)
Addended by: Jaynie Collins A on: 11/23/2018 05:58 PM   Modules accepted: Orders

## 2018-12-19 ENCOUNTER — Encounter: Payer: Medicaid Other | Admitting: Obstetrics & Gynecology

## 2019-01-01 ENCOUNTER — Other Ambulatory Visit: Payer: Self-pay

## 2019-01-01 ENCOUNTER — Ambulatory Visit (INDEPENDENT_AMBULATORY_CARE_PROVIDER_SITE_OTHER): Payer: Medicaid Other | Admitting: Advanced Practice Midwife

## 2019-01-01 VITALS — BP 126/76 | HR 72 | Wt 156.0 lb

## 2019-01-01 DIAGNOSIS — O4693 Antepartum hemorrhage, unspecified, third trimester: Secondary | ICD-10-CM

## 2019-01-01 DIAGNOSIS — O4692 Antepartum hemorrhage, unspecified, second trimester: Secondary | ICD-10-CM

## 2019-01-01 DIAGNOSIS — Z3A30 30 weeks gestation of pregnancy: Secondary | ICD-10-CM

## 2019-01-01 DIAGNOSIS — Z3480 Encounter for supervision of other normal pregnancy, unspecified trimester: Secondary | ICD-10-CM

## 2019-01-01 DIAGNOSIS — O09293 Supervision of pregnancy with other poor reproductive or obstetric history, third trimester: Secondary | ICD-10-CM

## 2019-01-01 DIAGNOSIS — O09299 Supervision of pregnancy with other poor reproductive or obstetric history, unspecified trimester: Secondary | ICD-10-CM

## 2019-01-01 NOTE — Progress Notes (Signed)
Declined tdap today

## 2019-01-01 NOTE — Progress Notes (Signed)
   PRENATAL VISIT NOTE  Subjective:  Nicole Mcmahon is a 23 y.o. G2P1001 at [redacted]w[redacted]d being seen today for ongoing prenatal care.  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  Patient reports no complaints.  Contractions: Not present. Vag. Bleeding: None.  Movement: Present. Denies leaking of fluid.   The following portions of the patient's history were reviewed and updated as appropriate: allergies, current medications, past family history, past medical history, past social history, past surgical history and problem list. Problem list updated.  Objective:   Vitals:   01/01/19 0914  BP: 126/76  Pulse: 72  Weight: 156 lb (70.8 kg)    Fetal Status: Fetal Heart Rate (bpm): 134 Fundal Height: 32 cm Movement: Present     General:  Alert, oriented and cooperative. Patient is in no acute distress.  Skin: Skin is warm and dry. No rash noted.   Cardiovascular: Normal heart rate noted  Respiratory: Normal respiratory effort, no problems with respiration noted  Abdomen: Soft, gravid, appropriate for gestational age.  Pain/Pressure: Absent     Pelvic: Cervical exam deferred        Extremities: Normal range of motion.  Edema: None  Mental Status: Normal mood and affect. Normal behavior. Normal judgment and thought content.   Assessment and Plan:  Pregnancy: G2P1001 at [redacted]w[redacted]d  1. Supervision of other normal pregnancy, antepartum - Continue routine care - Discussed kick counts BID, interventions for not meeting kick counts - Confirmed location or MAU, signs/symptoms which would merit  evaluation vs clinic visit - Discussed remaining visits, milestones - Glucose Tolerance, 2 Hours w/1 Hour - RPR - CBC - HIV Antibody (routine testing w rflx)  2. History of pospartum preeclampsia after antepartum GHTN in prior pregnancy, currently pregnant - Reviewed severe signs/symptoms - Patient compliant with daily Aspirin 81 mg  3. Vaginal bleeding in pregnancy, second trimester - Ongoing postcoital spotting - Discussed need for evaluation in MAU for any recurrent bleeding   Preterm labor symptoms and general obstetric precautions including but not limited to vaginal bleeding, contractions, leaking of fluid and fetal movement were reviewed in detail with the patient. Please refer to After Visit Summary for other counseling recommendations.  Return in about 3 weeks (around 01/22/2019) for virtual appointment.  No future appointments.  Darlina Rumpf, CNM

## 2019-01-01 NOTE — Patient Instructions (Signed)

## 2019-01-02 ENCOUNTER — Other Ambulatory Visit: Payer: Self-pay | Admitting: Advanced Practice Midwife

## 2019-01-02 LAB — CBC
Hematocrit: 28.4 % — ABNORMAL LOW (ref 34.0–46.6)
Hemoglobin: 9.5 g/dL — ABNORMAL LOW (ref 11.1–15.9)
MCH: 30.6 pg (ref 26.6–33.0)
MCHC: 33.5 g/dL (ref 31.5–35.7)
MCV: 92 fL (ref 79–97)
Platelets: 253 10*3/uL (ref 150–450)
RBC: 3.1 x10E6/uL — ABNORMAL LOW (ref 3.77–5.28)
RDW: 11.8 % (ref 11.7–15.4)
WBC: 15 10*3/uL — ABNORMAL HIGH (ref 3.4–10.8)

## 2019-01-02 LAB — HIV ANTIBODY (ROUTINE TESTING W REFLEX): HIV Screen 4th Generation wRfx: NONREACTIVE

## 2019-01-02 LAB — GLUCOSE TOLERANCE, 2 HOURS W/ 1HR
Glucose, 1 hour: 105 mg/dL (ref 65–179)
Glucose, 2 hour: 78 mg/dL (ref 65–152)
Glucose, Fasting: 81 mg/dL (ref 65–91)

## 2019-01-02 LAB — RPR: RPR Ser Ql: NONREACTIVE

## 2019-01-02 MED ORDER — POLYSACCHARIDE IRON COMPLEX 150 MG PO CAPS: 150.0000 mg | ORAL_CAPSULE | ORAL | Status: DC

## 2019-01-02 NOTE — Progress Notes (Signed)
Asymptomatic anemia, pt notified via active [place], [place] 01/02/19 8:13 PM

## 2019-01-22 ENCOUNTER — Telehealth (INDEPENDENT_AMBULATORY_CARE_PROVIDER_SITE_OTHER): Payer: Medicaid Other | Admitting: Family Medicine

## 2019-01-22 ENCOUNTER — Encounter: Payer: Self-pay | Admitting: Family Medicine

## 2019-01-22 ENCOUNTER — Other Ambulatory Visit: Payer: Self-pay

## 2019-01-22 DIAGNOSIS — O09899 Supervision of other high risk pregnancies, unspecified trimester: Secondary | ICD-10-CM

## 2019-01-22 DIAGNOSIS — Z3A33 33 weeks gestation of pregnancy: Secondary | ICD-10-CM

## 2019-01-22 DIAGNOSIS — O09299 Supervision of pregnancy with other poor reproductive or obstetric history, unspecified trimester: Secondary | ICD-10-CM

## 2019-01-22 DIAGNOSIS — O9989 Other specified diseases and conditions complicating pregnancy, childbirth and the puerperium: Secondary | ICD-10-CM

## 2019-01-22 DIAGNOSIS — Z2839 Other underimmunization status: Secondary | ICD-10-CM

## 2019-01-22 DIAGNOSIS — O09893 Supervision of other high risk pregnancies, third trimester: Secondary | ICD-10-CM

## 2019-01-22 DIAGNOSIS — Z3480 Encounter for supervision of other normal pregnancy, unspecified trimester: Secondary | ICD-10-CM

## 2019-01-22 DIAGNOSIS — Z283 Underimmunization status: Secondary | ICD-10-CM

## 2019-01-22 DIAGNOSIS — O09293 Supervision of pregnancy with other poor reproductive or obstetric history, third trimester: Secondary | ICD-10-CM

## 2019-01-22 NOTE — Progress Notes (Signed)
I connected with@ on 01/22/19 at  2:00 PM EDT by: MyChart Video and verified that I am speaking with the correct person using two identifiers.  Patient is located at home and provider is located at [facility].     The purpose of this virtual visit is to provide medical care while limiting exposure to the novel coronavirus. I discussed the limitations, risks, security and privacy concerns of performing an evaluation and management service by video and the availability of in person appointments. I also discussed with the patient that there may be a patient responsible charge related to this service. By engaging in this virtual visit, you consent to the provision of healthcare.  Additionally, you authorize for your insurance to be billed for the services provided during this visit.  The patient expressed understanding and agreed to proceed.  The following staff members participated in the virtual visit:  Crosby Oyster    PRENATAL VISIT NOTE  Subjective:  Nicole Mcmahon is a 23 y.o. G2P1001 at [redacted]w[redacted]d  for phone visit for ongoing prenatal care.  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  Patient reports backache.  Contractions: Not present. Vag. Bleeding: None.  Movement: Present. Denies leaking of fluid.   The following portions of the patient's history were reviewed and updated as appropriate: allergies, current medications, past family history, past medical history, past social history, past surgical history and problem list.   Objective:  There were no vitals filed for this visit.  Self-Obtained  Fetal Status:     Movement: Present     Assessment and Plan:  Pregnancy: G2P1001 at [redacted]w[redacted]d 1. Supervision of other normal pregnancy, antepartum UTD Reviewed normal back pain in pregnancy Recommended increased water intake due to occasional presyncopal sx. Recommended 8-10 glasses daily Discussed pediatrician and updated box Patient encouraged to check BP on babyscripts.   Preterm labor symptoms and general obstetric precautions including but not limited to vaginal bleeding, contractions, leaking of fluid and fetal movement were reviewed in detail with the patient.  Return in about 4 weeks (around 02/19/2019) for Routine prenatal care.  No future appointments.   Time spent on virtual visit: 15 minutes  Caren Macadam, MD

## 2019-01-22 NOTE — Progress Notes (Signed)
I connected with  Huey Romans on 01/22/19 at  2:00 PM EDT by telephone and verified that I am speaking with the correct person using two identifiers.   I discussed the limitations, risks, security and privacy concerns of performing an evaluation and management service by telephone and the availability of in person appointments. I also discussed with the patient that there may be a patient responsible charge related to this service. The patient expressed understanding and agreed to proceed.  Crosby Oyster, RN 01/22/2019  2:46 PM

## 2019-02-03 ENCOUNTER — Telehealth: Payer: Self-pay | Admitting: *Deleted

## 2019-02-03 NOTE — Telephone Encounter (Signed)
Left message for pt to call back if still having issues or has more questions.

## 2019-02-03 NOTE — Telephone Encounter (Signed)
Pt called back and states yesterday she was having pain in her sides and stomach, but feels better today. Denies any vaginal bleeding or leaking of fluid, baby is moving well. Instructed patient to continue to monitor pain and if they get worse or has any other symptoms to call the office.

## 2019-02-12 ENCOUNTER — Other Ambulatory Visit (HOSPITAL_COMMUNITY)
Admission: RE | Admit: 2019-02-12 | Discharge: 2019-02-12 | Disposition: A | Discharge status: Home / Self Care | Payer: Medicaid Other | Source: Ambulatory Visit | Attending: Family Medicine | Admitting: Family Medicine

## 2019-02-12 ENCOUNTER — Ambulatory Visit (INDEPENDENT_AMBULATORY_CARE_PROVIDER_SITE_OTHER): Payer: Medicaid Other | Admitting: Family Medicine

## 2019-02-12 ENCOUNTER — Other Ambulatory Visit: Payer: Self-pay

## 2019-02-12 VITALS — BP 120/79 | HR 72 | Wt 167.2 lb

## 2019-02-12 DIAGNOSIS — Z3480 Encounter for supervision of other normal pregnancy, unspecified trimester: Secondary | ICD-10-CM | POA: Diagnosis not present

## 2019-02-12 DIAGNOSIS — Z3A36 36 weeks gestation of pregnancy: Secondary | ICD-10-CM

## 2019-02-12 DIAGNOSIS — O09293 Supervision of pregnancy with other poor reproductive or obstetric history, third trimester: Secondary | ICD-10-CM

## 2019-02-12 DIAGNOSIS — O09299 Supervision of pregnancy with other poor reproductive or obstetric history, unspecified trimester: Secondary | ICD-10-CM

## 2019-02-12 NOTE — Progress Notes (Signed)
   PRENATAL VISIT NOTE  Subjective:  Nicole Mcmahon is a 23 y.o. G2P1001 at [redacted]w[redacted]d being seen today for ongoing prenatal care.  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  Patient reports no complaints.  Contractions: Not present.  .  Movement: Present. Denies leaking of fluid.   The following portions of the patient's history were reviewed and updated as appropriate: allergies, current medications, past family history, past medical history, past social history, past surgical history and problem list.   Objective:   Vitals:   02/12/19 1427  BP: 120/79  Pulse: 72  Weight: 167 lb 3.2 oz (75.8 kg)    Fetal Status: Fetal Heart Rate (bpm): 144 Fundal Height: 35 cm Movement: Present  Presentation: Vertex  General:  Alert, oriented and cooperative. Patient is in no acute distress.  Skin: Skin is warm and dry. No rash noted.   Cardiovascular: Normal heart rate noted  Respiratory: Normal respiratory effort, no problems with respiration noted  Abdomen: Soft, gravid, appropriate for gestational age.  Pain/Pressure: Present     Pelvic: Cervical exam performed Dilation: 1.5 Effacement (%): 50 Station: -2  Extremities: Normal range of motion.  Edema: None  Mental Status: Normal mood and affect. Normal behavior. Normal judgment and thought content.   Assessment and Plan:  Pregnancy: G2P1001 at [redacted]w[redacted]d 1. Supervision of other normal pregnancy, antepartum Continue routine prenatal care. Cultures today - GC/Chlamydia probe amp ([place])not at [facility] - Culture, beta strep (group b only)   2. History of pospartum preeclampsia after antepartum GHTN in prior pregnancy, currently pregnant BP is well controlled today Continue ASA  Preterm labor symptoms and general obstetric precautions including but not limited to vaginal bleeding, contractions, leaking of fluid and fetal movement were reviewed in detail with the patient. Please refer to After Visit Summary for other counseling recommendations.   Return in 1 week (on 02/19/2019) for virtual, [facility].  Future Appointments  Date Time Provider [place]  02/19/2019  1:45 PM Caren Macadam, MD [facility]    Donnamae Jude, MD

## 2019-02-12 NOTE — Patient Instructions (Signed)

## 2019-02-13 LAB — GC/CHLAMYDIA PROBE AMP (~~LOC~~) NOT AT ARMC
Chlamydia: NEGATIVE
Neisseria Gonorrhea: NEGATIVE

## 2019-02-16 LAB — CULTURE, BETA STREP (GROUP B ONLY): Strep Gp B Culture: NEGATIVE

## 2019-02-19 ENCOUNTER — Encounter: Payer: Self-pay | Admitting: Family Medicine

## 2019-02-19 ENCOUNTER — Other Ambulatory Visit: Payer: Self-pay

## 2019-02-19 ENCOUNTER — Telehealth (INDEPENDENT_AMBULATORY_CARE_PROVIDER_SITE_OTHER): Payer: Medicaid Other | Admitting: Family Medicine

## 2019-02-19 DIAGNOSIS — O09899 Supervision of other high risk pregnancies, unspecified trimester: Secondary | ICD-10-CM

## 2019-02-19 DIAGNOSIS — O09293 Supervision of pregnancy with other poor reproductive or obstetric history, third trimester: Secondary | ICD-10-CM | POA: Diagnosis not present

## 2019-02-19 DIAGNOSIS — O09893 Supervision of other high risk pregnancies, third trimester: Secondary | ICD-10-CM

## 2019-02-19 DIAGNOSIS — O9989 Other specified diseases and conditions complicating pregnancy, childbirth and the puerperium: Secondary | ICD-10-CM

## 2019-02-19 DIAGNOSIS — Z283 Underimmunization status: Secondary | ICD-10-CM | POA: Diagnosis not present

## 2019-02-19 DIAGNOSIS — Z2839 Other underimmunization status: Secondary | ICD-10-CM

## 2019-02-19 DIAGNOSIS — Z3A37 37 weeks gestation of pregnancy: Secondary | ICD-10-CM | POA: Diagnosis not present

## 2019-02-19 DIAGNOSIS — Z3480 Encounter for supervision of other normal pregnancy, unspecified trimester: Secondary | ICD-10-CM

## 2019-02-19 DIAGNOSIS — O09299 Supervision of pregnancy with other poor reproductive or obstetric history, unspecified trimester: Secondary | ICD-10-CM

## 2019-02-19 NOTE — Progress Notes (Signed)
I connected with  Huey Romans on 02/19/19 at  1:45 PM EDT by telephone and verified that I am speaking with the correct person using two identifiers.   I discussed the limitations, risks, security and privacy concerns of performing an evaluation and management service by telephone and the availability of in person appointments. I also discussed with the patient that there may be a patient responsible charge related to this service. The patient expressed understanding and agreed to proceed.  Crosby Oyster, RN 02/19/2019  2:01 PM

## 2019-02-19 NOTE — Progress Notes (Signed)
I connected with@ on 02/19/19 at  1:45 PM EDT by: MyChart video and verified that I am speaking with the correct person using two identifiers.  Patient is located at in car-passenger and provider is located at [facility].     The purpose of this virtual visit is to provide medical care while limiting exposure to the novel coronavirus. I discussed the limitations, risks, security and privacy concerns of performing an evaluation and management service by video and the availability of in person appointments. I also discussed with the patient that there may be a patient responsible charge related to this service. By engaging in this virtual visit, you consent to the provision of healthcare.  Additionally, you authorize for your insurance to be billed for the services provided during this visit.  The patient expressed understanding and agreed to proceed.  The following staff members participated in the virtual visit:  Crosby Oyster    PRENATAL VISIT NOTE  Subjective:  Nicole Mcmahon is a 23 y.o. G2P1001 at [redacted]w[redacted]d for phone visit for ongoing prenatal care.  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  Patient reports no complaints.  Contractions: Not present. Vag. Bleeding: None.  Movement: Present. Denies leaking of fluid.   The following portions of the patient's history were reviewed and updated as appropriate: allergies, current medications, past family history, past medical history, past social history, past surgical history and problem list.   Objective:  There were no vitals filed for this  visit. Self-Obtained  Fetal Status:     Movement: Present     Assessment and Plan:  Pregnancy: G2P1001 at 373w0d1. History of pospartum preeclampsia after antepartum GHTN in prior pregnancy, currently pregnant BP has been normal   2. Rubella non-immune status, antepartum MMR pp  3. Short interval between pregnancies affecting pregnancy, antepartum Discussed Depo today-- desire this immediately post partum  4. Supervision of other normal pregnancy, antepartum Updated contraceptive plan Up to date otherwise Patient will call with BP, currently in car as passenger Patient desires female providers and we reviewed how we try to accommodate this request but cannot guarantee this   Term labor symptoms and general obstetric precautions including but not limited to vaginal bleeding, contractions, leaking of fluid and fetal movement were reviewed in detail with the patient.  Return in about 1 week (around 02/26/2019) for Routine prenatal care, Telehealth/Virtual health OB Visit.  No future appointments.   Time spent on virtual visit: 15 minutes  KiCaren MacadamMD

## 2019-02-27 ENCOUNTER — Other Ambulatory Visit: Payer: Self-pay

## 2019-02-27 ENCOUNTER — Encounter: Payer: Self-pay | Admitting: Family Medicine

## 2019-02-27 ENCOUNTER — Ambulatory Visit (INDEPENDENT_AMBULATORY_CARE_PROVIDER_SITE_OTHER): Payer: Medicaid Other | Admitting: Family Medicine

## 2019-02-27 VITALS — BP 119/70 | HR 72 | Wt 170.5 lb

## 2019-02-27 DIAGNOSIS — O9989 Other specified diseases and conditions complicating pregnancy, childbirth and the puerperium: Secondary | ICD-10-CM

## 2019-02-27 DIAGNOSIS — Z3A38 38 weeks gestation of pregnancy: Secondary | ICD-10-CM

## 2019-02-27 DIAGNOSIS — O09893 Supervision of other high risk pregnancies, third trimester: Secondary | ICD-10-CM

## 2019-02-27 DIAGNOSIS — Z283 Underimmunization status: Secondary | ICD-10-CM

## 2019-02-27 DIAGNOSIS — R87612 Low grade squamous intraepithelial lesion on cytologic smear of cervix (LGSIL): Secondary | ICD-10-CM

## 2019-02-27 DIAGNOSIS — O09899 Supervision of other high risk pregnancies, unspecified trimester: Secondary | ICD-10-CM

## 2019-02-27 DIAGNOSIS — O09299 Supervision of pregnancy with other poor reproductive or obstetric history, unspecified trimester: Secondary | ICD-10-CM

## 2019-02-27 DIAGNOSIS — O09293 Supervision of pregnancy with other poor reproductive or obstetric history, third trimester: Secondary | ICD-10-CM

## 2019-02-27 DIAGNOSIS — Z3480 Encounter for supervision of other normal pregnancy, unspecified trimester: Secondary | ICD-10-CM

## 2019-02-27 NOTE — Progress Notes (Signed)
babyscripts

## 2019-03-04 NOTE — Progress Notes (Signed)
   PRENATAL VISIT NOTE  Subjective:  Nicole Mcmahon is a 23 y.o. G2P1001 at 17w6dbeing seen today for ongoing prenatal care.  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  Patient reports backache and occasional contractions.  Contractions: Irregular.  .  Movement: Present. Denies leaking of fluid.   The following portions of the patient's history were reviewed and updated as appropriate: allergies, current medications, past family history, past medical history, past social history, past surgical history and problem list.   Objective:   Vitals:   02/27/19 1111  BP: 119/70  Pulse: 72  Weight: 170 lb 8 oz (77.3 kg)    Fetal Status: Fetal Heart Rate (bpm): 140 Fundal Height: 38 cm Movement: Present  Presentation: Vertex  General:  Alert, oriented and cooperative. Patient is in no acute distress.  Skin: Skin is warm and dry. No rash noted.   Cardiovascular: Normal heart rate noted  Respiratory: Normal respiratory effort, no problems with respiration noted  Abdomen: Soft, gravid, appropriate for gestational age.  Pain/Pressure: Present     Pelvic: Cervical exam performed Dilation: 1 Effacement (%): 50 Station: -2  Extremities: Normal range of motion.  Edema: None  Mental Status: Normal mood and affect. Normal behavior. Normal judgment and thought content.   Assessment and Plan:  Pregnancy: G2P1001 at 354w6d. Short interval between pregnancies affecting pregnancy, antepartum   2. History of pospartum preeclampsia after antepartum GHTN in prior pregnancy, currently pregnant BP  wnl today  3. Rubella non-immune status, antepartum MMR PP  4. Supervision of other normal pregnancy, antepartum Up to date DIscussed IOL at 41 wks with planning to happen at 39 wks Reviewed sweeping membranes briefly, offer at 39 wks  5. 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 Please refer to After Visit Summary for other counseling recommendations.   Return in about 1 week (around 03/06/2019) for Routine prenatal care, in person.  Future Appointments  Date Time Provider [place]9/09/2018 11:15 AM PiAletha HalimMD [facility]    KiCaren MacadamMD

## 2019-03-06 ENCOUNTER — Other Ambulatory Visit: Payer: Self-pay

## 2019-03-06 ENCOUNTER — Encounter (HOSPITAL_COMMUNITY): Payer: Self-pay | Admitting: *Deleted

## 2019-03-06 ENCOUNTER — Ambulatory Visit (INDEPENDENT_AMBULATORY_CARE_PROVIDER_SITE_OTHER): Payer: Medicaid Other | Admitting: Obstetrics and Gynecology

## 2019-03-06 ENCOUNTER — Telehealth (HOSPITAL_COMMUNITY): Payer: Self-pay | Admitting: *Deleted

## 2019-03-06 VITALS — BP 138/75 | HR 72 | Wt 176.0 lb

## 2019-03-06 DIAGNOSIS — Z3480 Encounter for supervision of other normal pregnancy, unspecified trimester: Secondary | ICD-10-CM

## 2019-03-06 DIAGNOSIS — Z3483 Encounter for supervision of other normal pregnancy, third trimester: Secondary | ICD-10-CM

## 2019-03-06 DIAGNOSIS — Z3A39 39 weeks gestation of pregnancy: Secondary | ICD-10-CM

## 2019-03-06 NOTE — Telephone Encounter (Signed)
Preadmission screen  

## 2019-03-06 NOTE — Progress Notes (Signed)
Prenatal Visit Note Date: 03/06/2019 Clinic: [facility]  Subjective:  Nicole Mcmahon is a 23 y.o. G2P1001 at [redacted]w[redacted]d being seen today for ongoing prenatal care.  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  Patient reports no complaints.   Contractions: Irregular.  .  Movement: Present. Denies leaking of fluid.   The following portions of the patient's history were reviewed and updated as appropriate: allergies, current medications, past family history, past medical history, past social history, past surgical history and problem list. Problem list updated.  Objective:   Vitals:   03/06/19 1136  BP: 138/75  Pulse: 72  Weight: 176 lb (79.8 kg)    Fetal Status: Fetal Heart Rate (bpm): 154 Fundal Height: 38 cm Movement: Present  Presentation: Vertex  General:  Alert, oriented and cooperative. Patient is in no acute distress.  Skin: Skin is warm and dry. No rash noted.   Cardiovascular: Normal heart rate noted  Respiratory: Normal respiratory effort, no problems with respiration noted  Abdomen: Soft, gravid, appropriate for gestational age. Pain/Pressure: Present     Pelvic:  Cervical exam deferred        Extremities: Normal range of motion.  Edema: None  Mental Status: Normal mood and affect. Normal behavior. Normal judgment and thought content.   Urinalysis:      Assessment and Plan:  Pregnancy: G2P1001 at [redacted]w[redacted]d  1. Supervision of other normal pregnancy, antepartum Routine care. Pt prefers to wait until 40wks to set up PDIOL at 41wks  Term labor  symptoms and general obstetric precautions including but not limited to vaginal bleeding, contractions, leaking of fluid and fetal movement were reviewed in detail with the patient. Please refer to After Visit Summary for other counseling recommendations.  Return in about 1 week (around 03/13/2019) for low risk.   Aletha Halim, MD

## 2019-03-11 ENCOUNTER — Encounter: Payer: Self-pay | Admitting: Obstetrics & Gynecology

## 2019-03-11 ENCOUNTER — Ambulatory Visit (INDEPENDENT_AMBULATORY_CARE_PROVIDER_SITE_OTHER): Payer: Medicaid Other | Admitting: Obstetrics & Gynecology

## 2019-03-11 ENCOUNTER — Other Ambulatory Visit: Payer: Self-pay

## 2019-03-11 VITALS — BP 124/75 | HR 111 | Wt 175.8 lb

## 2019-03-11 DIAGNOSIS — Z3480 Encounter for supervision of other normal pregnancy, unspecified trimester: Secondary | ICD-10-CM | POA: Diagnosis not present

## 2019-03-11 DIAGNOSIS — O3663X0 Maternal care for excessive fetal growth, third trimester, not applicable or unspecified: Secondary | ICD-10-CM

## 2019-03-11 DIAGNOSIS — O480 Post-term pregnancy: Secondary | ICD-10-CM

## 2019-03-11 DIAGNOSIS — Z3A39 39 weeks gestation of pregnancy: Secondary | ICD-10-CM

## 2019-03-11 NOTE — Progress Notes (Signed)
   PRENATAL VISIT NOTE  Subjective:  Nicole Mcmahon is a 23 y.o. G2P1001 at [redacted]w[redacted]d being seen today for ongoing prenatal care.  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  Patient reports no complaints. Wants to postpone already scheduled IOL from 41 to 42 weeks. Contractions: Irregular. Vag. Bleeding: None.  Movement: Present. Denies leaking of fluid.   The following portions of the patient's history were reviewed and updated as appropriate: allergies, current medications, past family history, past medical history, past social history, past surgical history and problem list.   Objective:   Vitals:   03/11/19 1102  BP: 124/75  Pulse: (!) 111  Weight: 175 lb 12.8 oz (79.7 kg)    Fetal Status: Fetal Heart Rate (bpm): 141 Fundal Height: 42 cm Movement: Present  Presentation: Vertex  General:  Alert, oriented and cooperative. Patient is in no acute distress.  Skin: Skin is warm and dry. No rash noted.   Cardiovascular: Normal heart rate noted  Respiratory: Normal respiratory effort, no problems with respiration noted  Abdomen: Soft, gravid, appropriate for gestational age.  Pain/Pressure: Present     Pelvic: Cervical exam deferred Dilation: 1.5 Effacement (%): 50 Station: -2  Extremities: Normal range of motion.  Edema: None  Mental Status: Normal mood and affect. Normal behavior. Normal judgment and thought content.   Assessment and Plan:  Pregnancy: G2P1001 at [redacted]w[redacted]d 1. Large for dates affecting management of mother, third trimester, not applicable or unspecified fetus Growth  scan ordered, will follow up results and manage accordingly. - [place] MFM OB FOLLOW UP; Future - [place] MFM FETAL BPP WO NON STRESS; Future  2. Post-term pregnancy, 40-42 weeks of gestation Counseled about concern about stillbirth, increased BP and other maternal-fetal morbidity/mortality risks after 41 weeks. She explained she understood, still wants to postpone date. IOL date moved to 42 weeks. Weekly BPP ordered for this week and the next week.  She was told that if there was any anomaly, delivery will be recommended sooner. - [place] MFM FETAL BPP WO NON STRESS; Future - [place] MFM FETAL BPP WO NON STRESS; Future  3. Supervision of other normal pregnancy, antepartum GBS culture repeated today given the possibility she will still be pregnant up to 42 weeks. - Strep Gp B NAA Term labor symptoms and general obstetric precautions including but not limited to vaginal bleeding, contractions, leaking of fluid and fetal movement were reviewed in detail with the patient. Please refer to After Visit Summary for other counseling recommendations.   Return in about 1 week (around 03/18/2019) for Virtual OB Visit.  Future Appointments  Date Time Provider [place]  03/13/2019  9:45 AM [facility]FC [place] 2 WH-MFCUS MFC-US  03/17/2019  8:00 AM [facility]-MAU 1 [facility]-INDC None  03/18/2019  9:45 AM Aletha Halim, MD [facility]  03/20/2019 10:45 AM WH-MFC [place] 2 WH-MFCUS MFC-US  03/27/2019 10:00 AM MC-LD SCHED ROOM MC-INDC None    Verita Schneiders, MD

## 2019-03-11 NOTE — Patient Instructions (Addendum)

## 2019-03-13 ENCOUNTER — Ambulatory Visit (HOSPITAL_COMMUNITY)
Admission: RE | Admit: 2019-03-13 | Discharge: 2019-03-13 | Disposition: A | Discharge status: Home / Self Care | Payer: Medicaid Other | Source: Ambulatory Visit | Attending: Obstetrics & Gynecology | Admitting: Obstetrics & Gynecology

## 2019-03-13 ENCOUNTER — Other Ambulatory Visit: Payer: Self-pay

## 2019-03-13 ENCOUNTER — Encounter (HOSPITAL_COMMUNITY): Payer: Self-pay

## 2019-03-13 ENCOUNTER — Inpatient Hospital Stay (HOSPITAL_COMMUNITY)
Admission: AD | Admit: 2019-03-13 | Discharge: 2019-03-16 | DRG: 807 | Disposition: A | Discharge status: Home / Self Care | Payer: Medicaid Other | Attending: Obstetrics and Gynecology | Admitting: Obstetrics and Gynecology

## 2019-03-13 ENCOUNTER — Inpatient Hospital Stay (HOSPITAL_COMMUNITY): Payer: Medicaid Other | Admitting: Anesthesiology

## 2019-03-13 DIAGNOSIS — O09293 Supervision of pregnancy with other poor reproductive or obstetric history, third trimester: Secondary | ICD-10-CM | POA: Diagnosis not present

## 2019-03-13 DIAGNOSIS — Z362 Encounter for other antenatal screening follow-up: Secondary | ICD-10-CM | POA: Diagnosis not present

## 2019-03-13 DIAGNOSIS — O283 Abnormal ultrasonic finding on antenatal screening of mother: Secondary | ICD-10-CM | POA: Diagnosis not present

## 2019-03-13 DIAGNOSIS — Z2839 Other underimmunization status: Secondary | ICD-10-CM

## 2019-03-13 DIAGNOSIS — Z3A40 40 weeks gestation of pregnancy: Secondary | ICD-10-CM

## 2019-03-13 DIAGNOSIS — O164 Unspecified maternal hypertension, complicating childbirth: Secondary | ICD-10-CM | POA: Diagnosis not present

## 2019-03-13 DIAGNOSIS — O09893 Supervision of other high risk pregnancies, third trimester: Secondary | ICD-10-CM | POA: Diagnosis not present

## 2019-03-13 DIAGNOSIS — O9902 Anemia complicating childbirth: Secondary | ICD-10-CM | POA: Diagnosis present

## 2019-03-13 DIAGNOSIS — O09299 Supervision of pregnancy with other poor reproductive or obstetric history, unspecified trimester: Secondary | ICD-10-CM

## 2019-03-13 DIAGNOSIS — O09899 Supervision of other high risk pregnancies, unspecified trimester: Secondary | ICD-10-CM

## 2019-03-13 DIAGNOSIS — O26843 Uterine size-date discrepancy, third trimester: Secondary | ICD-10-CM

## 2019-03-13 DIAGNOSIS — O480 Post-term pregnancy: Secondary | ICD-10-CM

## 2019-03-13 DIAGNOSIS — O3663X0 Maternal care for excessive fetal growth, third trimester, not applicable or unspecified: Secondary | ICD-10-CM

## 2019-03-13 DIAGNOSIS — Z20828 Contact with and (suspected) exposure to other viral communicable diseases: Secondary | ICD-10-CM | POA: Diagnosis present

## 2019-03-13 DIAGNOSIS — D573 Sickle-cell trait: Secondary | ICD-10-CM | POA: Diagnosis present

## 2019-03-13 DIAGNOSIS — Z283 Underimmunization status: Secondary | ICD-10-CM

## 2019-03-13 LAB — PROTEIN / CREATININE RATIO, URINE
Creatinine, Urine: 30.15 mg/dL
Protein Creatinine Ratio: 0.27 mg/mg{Cre} — ABNORMAL HIGH (ref 0.00–0.15)
Total Protein, Urine: 8 mg/dL

## 2019-03-13 LAB — COMPREHENSIVE METABOLIC PANEL
ALT: 13 U/L (ref 0–44)
AST: 24 U/L (ref 15–41)
Albumin: 3.4 g/dL — ABNORMAL LOW (ref 3.5–5.0)
Alkaline Phosphatase: 233 U/L — ABNORMAL HIGH (ref 38–126)
Anion gap: 11 (ref 5–15)
BUN: <5 mg/dL — ABNORMAL LOW (ref 6–20)
CO2: 20 mmol/L — ABNORMAL LOW (ref 22–32)
Calcium: 9.2 mg/dL (ref 8.9–10.3)
Chloride: 105 mmol/L (ref 98–111)
Creatinine, Ser: 0.57 mg/dL (ref 0.44–1.00)
GFR calc Af Amer: >60 mL/min (ref >60)
GFR calc non Af Amer: >60 mL/min (ref >60)
Glucose, Bld: 73 mg/dL (ref 70–99)
Potassium: 4 mmol/L (ref 3.5–5.1)
Sodium: 136 mmol/L (ref 135–145)
Total Bilirubin: 0.4 mg/dL (ref 0.3–1.2)
Total Protein: 6.5 g/dL (ref 6.5–8.1)

## 2019-03-13 LAB — CBC
HCT: 30.4 % — ABNORMAL LOW (ref 36.0–46.0)
Hemoglobin: 9.1 g/dL — ABNORMAL LOW (ref 12.0–15.0)
MCH: 25.3 pg — ABNORMAL LOW (ref 26.0–34.0)
MCHC: 29.9 g/dL — ABNORMAL LOW (ref 30.0–36.0)
MCV: 84.4 fL (ref 80.0–100.0)
Platelets: 306 10*3/uL (ref 150–400)
RBC: 3.6 MIL/uL — ABNORMAL LOW (ref 3.87–5.11)
RDW: 15 % (ref 11.5–15.5)
WBC: 18.6 10*3/uL — ABNORMAL HIGH (ref 4.0–10.5)
nRBC: 0 % (ref 0.0–0.2)

## 2019-03-13 LAB — ABO/RH: ABO/RH(D): O POS

## 2019-03-13 LAB — SARS CORONAVIRUS 2 BY RT PCR (HOSPITAL ORDER, PERFORMED IN ~~LOC~~ HOSPITAL LAB): SARS Coronavirus 2: NEGATIVE

## 2019-03-13 LAB — TYPE AND SCREEN
ABO/RH(D): O POS
Antibody Screen: NEGATIVE

## 2019-03-13 LAB — STREP GP B NAA: Strep Gp B NAA: NEGATIVE

## 2019-03-13 MED ORDER — EPHEDRINE 5 MG/ML INJ: 10.0000 mg | INTRAVENOUS | Status: DC | PRN

## 2019-03-13 MED ORDER — OXYTOCIN 40 UNITS IN NORMAL SALINE INFUSION - SIMPLE MED: 1.0000 m[IU]/min | INTRAVENOUS | Status: DC

## 2019-03-13 MED ORDER — PHENYLEPHRINE 40 MCG/ML (10ML) SYRINGE FOR IV PUSH (FOR BLOOD PRESSURE SUPPORT)
80.0000 ug | PREFILLED_SYRINGE | INTRAVENOUS | Status: DC | PRN
  Filled 2019-03-13: qty 10

## 2019-03-13 MED ORDER — FENTANYL CITRATE (PF) 100 MCG/2ML IJ SOLN
50.0000 ug | INTRAMUSCULAR | Status: DC | PRN
  Administered 2019-03-13: 100 ug via INTRAVENOUS
  Filled 2019-03-13 (×2): qty 2

## 2019-03-13 MED ORDER — LIDOCAINE HCL (PF) 1 % IJ SOLN: 30.0000 mL | INTRAMUSCULAR | Status: DC | PRN

## 2019-03-13 MED ORDER — FENTANYL-BUPIVACAINE-NACL 0.5-0.125-0.9 MG/250ML-% EP SOLN
12.0000 mL/h | EPIDURAL | Status: DC | PRN
  Filled 2019-03-13: qty 250

## 2019-03-13 MED ORDER — OXYTOCIN 40 UNITS IN NORMAL SALINE INFUSION - SIMPLE MED
2.5000 [IU]/h | INTRAVENOUS | Status: DC
  Filled 2019-03-13: qty 1000

## 2019-03-13 MED ORDER — SODIUM CHLORIDE (PF) 0.9 % IJ SOLN
INTRAMUSCULAR | Status: DC | PRN
  Administered 2019-03-13: 12 mL/h via EPIDURAL

## 2019-03-13 MED ORDER — LACTATED RINGERS IV SOLN
INTRAVENOUS | Status: DC
  Administered 2019-03-13 – 2019-03-14 (×3): via INTRAVENOUS

## 2019-03-13 MED ORDER — SOD CITRATE-CITRIC ACID 500-334 MG/5ML PO SOLN: 30.0000 mL | ORAL | Status: DC | PRN

## 2019-03-13 MED ORDER — PHENYLEPHRINE 40 MCG/ML (10ML) SYRINGE FOR IV PUSH (FOR BLOOD PRESSURE SUPPORT): 80.0000 ug | PREFILLED_SYRINGE | INTRAVENOUS | Status: DC | PRN

## 2019-03-13 MED ORDER — DIPHENHYDRAMINE HCL 50 MG/ML IJ SOLN: 12.5000 mg | INTRAMUSCULAR | Status: DC | PRN

## 2019-03-13 MED ORDER — OXYTOCIN 40 UNITS IN NORMAL SALINE INFUSION - SIMPLE MED
1.0000 m[IU]/min | INTRAVENOUS | Status: DC
  Administered 2019-03-13: 15:00:00 2 m[IU]/min via INTRAVENOUS

## 2019-03-13 MED ORDER — LACTATED RINGERS IV SOLN: 500.0000 mL | Freq: Once | INTRAVENOUS | Status: DC

## 2019-03-13 MED ORDER — OXYTOCIN BOLUS FROM INFUSION
500.0000 mL | Freq: Once | INTRAVENOUS | Status: AC
  Administered 2019-03-14: 500 mL via INTRAVENOUS

## 2019-03-13 MED ORDER — ACETAMINOPHEN 325 MG PO TABS: 650.0000 mg | ORAL_TABLET | ORAL | Status: DC | PRN

## 2019-03-13 MED ORDER — TERBUTALINE SULFATE 1 MG/ML IJ SOLN: 0.2500 mg | Freq: Once | INTRAMUSCULAR | Status: DC | PRN

## 2019-03-13 MED ORDER — MISOPROSTOL 25 MCG QUARTER TABLET: 25.0000 ug | ORAL_TABLET | ORAL | Status: DC | PRN

## 2019-03-13 MED ORDER — LIDOCAINE HCL (PF) 1 % IJ SOLN
INTRAMUSCULAR | Status: DC | PRN
  Administered 2019-03-13 (×2): 5 mL via EPIDURAL

## 2019-03-13 MED ORDER — ONDANSETRON HCL 4 MG/2ML IJ SOLN: 4.0000 mg | Freq: Four times a day (QID) | INTRAMUSCULAR | Status: DC | PRN

## 2019-03-13 MED ORDER — LACTATED RINGERS IV SOLN: 500.0000 mL | INTRAVENOUS | Status: DC | PRN

## 2019-03-13 MED ORDER — FENTANYL-BUPIVACAINE-NACL 0.5-0.125-0.9 MG/250ML-% EP SOLN: 12.0000 mL/h | EPIDURAL | Status: DC | PRN

## 2019-03-13 NOTE — Progress Notes (Signed)
03/13/2019 - 7:42 PM  23 y.o. G2P1001 [redacted]w[redacted]d   Pregnancy complicated by post-dates, BPP 2/8  Patient Active Problem List   Diagnosis Date Noted  . Indication for care in labor and delivery, antepartum 03/13/2019  . 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 Pregnancy headache, antepartum 10/31/2018  . Short interval between pregnancies affecting pregnancy, antepartum 09/04/2018  . 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 History of pospartum preeclampsia after antepartum GHTN in prior pregnancy, currently pregnant 05/10/2017  . Rubella non-immune status, antepartum 01/22/2017  . Supervision of other normal pregnancy, antepartum 01/17/2017    Nicole Mcmahon is admitted for IOL for BPP 2/8   Subjective:  Nicole Mcmahon states she is doing well. Rating ctx 5/10. Membranes is still intact  Objective:   Vitals:   03/13/19 1730 03/13/19 1800 03/13/19 1830 03/13/19 1917  BP: 117/73 133/76 128/82   Pulse: 79 91 88   Resp: 18 18 18    Temp:   (!) 97.4 F (36.3 C) 98.9 F (37.2 C)  TempSrc:   Oral Oral    Current Vital Signs 24h Vital Sign Ranges  T 98.9 F (37.2 C) Temp  Avg: 98.2 F (36.8 C)  Min: 97.4 F (36.3 C)  Max: 98.9 F (37.2 C)  BP (pt walking; will continue to monitor) BP  Min: 117/73  Max: 143/71  HR 88 Pulse  Avg: 94.1  Min: 79  Max: 112  RR 18 Resp  Avg: 17.4  Min: 16  Max: 18  SaO2     No data recorded       24 Hour I/O Current Shift I/O  Time Ins Outs No intake/output data recorded. No intake/output data recorded.   FHR: 145 baseline, good variability, + accels, int variables decels.  Toco: q24mins SVE: 4-5/70-80/-2  Assessment & Plan:  FHT CAT 1 GBS: negative  Pitocin @ 12 Analgesia: none

## 2019-03-13 NOTE — Anesthesia Procedure Notes (Signed)
Epidural Patient location during procedure: OB  Staffing Anesthesiologist: Carvell Hoeffner, MD Performed: anesthesiologist   Preanesthetic Checklist Completed: patient identified, site marked, surgical consent, pre-op evaluation, timeout performed, IV checked, risks and benefits discussed and monitors and equipment checked  Epidural Patient position: sitting Prep: DuraPrep Patient monitoring: heart rate, continuous pulse ox and blood pressure Approach: right paramedian Location: L3-L4 Injection technique: LOR saline  Needle:  Needle type: Tuohy  Needle gauge: 17 G Needle length: 9 cm and 9 Needle insertion depth: 6 cm Catheter type: closed end flexible Catheter size: 20 Guage Catheter at skin depth: 10 cm Test dose: negative  Assessment Events: blood not aspirated, injection not painful, no injection resistance, negative IV test and no paresthesia  Additional Notes Patient identified. Risks/Benefits/Options discussed with patient including but not limited to bleeding, infection, nerve damage, paralysis, failed block, incomplete pain control, headache, blood pressure changes, nausea, vomiting, reactions to medication both or allergic, itching and postpartum back pain. Confirmed with bedside nurse the patient's most recent platelet count. Confirmed with patient that they are not currently taking any anticoagulation, have any bleeding history or any family history of bleeding disorders. Patient expressed understanding and wished to proceed. All questions were answered. Sterile technique was used throughout the entire procedure. Please see nursing notes for vital signs. Test dose was given through epidural needle and negative prior to continuing to dose epidural or start infusion. Warning signs of high block given to the patient including shortness of breath, tingling/numbness in hands, complete motor block, or any concerning symptoms with instructions to call for help. Patient was given  instructions on fall risk and not to get out of bed. All questions and concerns addressed with instructions to call with any issues.     

## 2019-03-13 NOTE — H&P (Addendum)
OBSTETRIC ADMISSION HISTORY AND PHYSICAL  Nicole Mcmahon is a 23 y.o. female G2P1001 with IUP at [redacted]w[redacted]d presenting for IOL 2/2 to post-dates and BPP 2/8 today. She reports +FMs. No LOF, VB, blurry vision, headaches, peripheral edema, or RUQ pain. She plans on breast feeding. She requests depo for birth control.  Dating: By LMP and 13 week [place] confirms --->  Estimated Date of Delivery: 03/12/19  Sono:    @[redacted]w[redacted]d , CWD, normal anatomy, cephalic presentation, 9678L, 71st%ile  BPP 2/8: Tone, movement, breathing not observed, normal AFI   Prenatal History/Complications: NSVDx1, feels that this baby is larger than her last  Hx of PP Pre-E and gHTN, no elevated BP this pregnancy, was on aspirin for period of time during pregnancy  Past Medical History: Past Medical History:  Diagnosis Date  . Asthma   . Blood dyscrasia   . H/O genital injury    Childhood injury where she jumped and landed on a stick that went into her vagina.   . Preeclampsia in postpartum period 05/10/2017  . Pregnancy induced hypertension   . Sickle cell trait [facility])     Past Surgical History: Past Surgical History:  Procedure Laterality Date  . NO PAST SURGERIES      Obstetrical History: OB History    Gravida  2   Para  1   Term  1   Preterm      AB      Living  1     SAB      TAB      Ectopic      Multiple  0   Live Births  1           Social History: Social History   Socioeconomic History  . Marital status: Married    Spouse name: Not on file  . Number of children: Not on file  . Years of education: Not on file  . Highest education level: Not on file  Occupational History  . Not on file  Social Needs  . Financial resource strain: Not on file  . Food insecurity    Worry: Not on file    Inability: Not on file  . Transportation needs    Medical: Not on file    Non-medical: Not on file  Tobacco Use  . Smoking status: Never Smoker  . Smokeless tobacco: Never Used  Substance and  Sexual Activity  . Alcohol use: No  . Drug use: No  . Sexual activity: Yes  Lifestyle  . Physical activity    Days per week: Not on file    Minutes per session: Not on file  . Stress: Not on file  Relationships  . Social Herbalist on phone: Not on file    Gets together: Not on file    Attends religious service: Not on file    Active member of club or organization: Not on file    Attends meetings of clubs or organizations: Not on file    Relationship status: Not on file  Other Topics Concern  . Not on file  Social History Narrative  . Not on file    Family History: Family History  Problem Relation Age of Onset  . Cancer Paternal Grandmother   . Lupus Paternal Grandmother   . Cancer Paternal Aunt     Allergies: No Known Allergies  Facility-Administered Medications Prior to Admission  Medication Dose Route Frequency Provider Last Rate Last Dose  . iron polysaccharides (NIFEREX) capsule 150  mg  150 mg Oral Richardine Service C, CNM       Medications Prior to Admission  Medication Sig Dispense Refill Last Dose  . aspirin EC 81 MG tablet Take 1 tablet (81 mg total) by mouth daily. Take after 12 weeks for prevention of preeclampsia later in pregnancy 300 tablet 2 Past Month at Unknown time     Review of Systems   All systems reviewed and negative except as stated in HPI  Blood pressure 128/81, pulse 90, temperature 98.2 F (36.8 C), temperature source Oral, resp. rate 18, last menstrual period 06/05/2018, currently breastfeeding. General appearance: alert, cooperative, appears stated age and no distress Lungs: regular rate and effort Heart: regular rate  Abdomen: soft, non-tender Extremities: Homans sign is negative, no sign of DVT Presentation: cephalic Fetal monitoringBaseline: 140 bpm, Variability: Good {> 6 bpm), Accelerations: Reactive and Decelerations: Absent Uterine activity: intermittent Dilation: 3.5 Effacement (%): 60, 70 Station:  -2 Exam by:: Dr. Kennieth Rad   Prenatal labs: ABO, Rh: --/--/PENDING (09/10 1239) Antibody: PENDING (09/10 1239) Rubella: <0.90 (03/04 1616) RPR: Non Reactive (07/01 0900)  HBsAg: Negative (03/04 1616)  HIV: Non Reactive (07/01 0900)  GBS: Negative (09/08 1306)  2 hr GTT 81/105/78  Prenatal Transfer Tool  Maternal Diabetes: No Genetic Screening: Normal Maternal Ultrasounds/Referrals: Other: MFM for BPP post-dates Fetal Ultrasounds or other Referrals:  BPP for post-dates Maternal Substance Abuse:  No Significant Maternal Medications:  None Significant Maternal Lab Results: Group B Strep negative  Results for orders placed or performed during the hospital encounter of 03/13/19 (from the past 24 hour(s))  Type and screen [facility]   Collection Time: 03/13/19 12:39 PM  Result Value Ref Range   ABO/RH(D) PENDING    Antibody Screen PENDING    Sample Expiration      03/16/2019,2359 Performed at [facility], [address] [place], [place] [address]   SARS Coronavirus 2 [facility] order, Performed in [facility] hospital lab) Nasopharyngeal Nasopharyngeal Swab   Collection Time: 03/13/19 12:55 PM   Specimen: Nasopharyngeal Swab  Result Value Ref Range   SARS Coronavirus 2 NEGATIVE NEGATIVE  CBC   Collection Time: 03/13/19 12:58 PM  Result Value Ref Range   WBC 18.6 (H) 4.0 - 10.5 K/uL   RBC 3.60 (L) 3.87 - 5.11 MIL/uL   Hemoglobin 9.1 (L) 12.0 - 15.0 g/dL   HCT 92.1 (L) 19.4 - 17.4 %   MCV 84.4 80.0 - 100.0 fL   MCH 25.3 (L) 26.0 - 34.0 pg   MCHC 29.9 (L) 30.0 - 36.0 g/dL   RDW 08.1 44.8 - 18.5 %   Platelets 306 150 - 400 K/uL   nRBC 0.0 0.0 - 0.2 %  Comprehensive metabolic panel   Collection Time: 03/13/19 12:58 PM  Result Value Ref Range   Sodium 136 135 - 145 mmol/L   Potassium 4.0 3.5 - 5.1 mmol/L   Chloride 105 98 - 111 mmol/L   CO2 20 (L) 22 - 32 mmol/L   Glucose, Bld 73 70 - 99 mg/dL   BUN <5 (L) 6 - 20 mg/dL   Creatinine, Ser 6.31 0.44  - 1.00 mg/dL   Calcium 9.2 8.9 - 49.7 mg/dL   Total Protein 6.5 6.5 - 8.1 g/dL   Albumin 3.4 (L) 3.5 - 5.0 g/dL   AST 24 15 - 41 U/L   ALT 13 0 - 44 U/L   Alkaline Phosphatase 233 (H) 38 - 126 U/L   Total Bilirubin  0.4 0.3 - 1.2 mg/dL   GFR calc non Af Amer >60 >60 mL/min   GFR calc Af Amer >60 >60 mL/min   Anion gap 11 5 - 15    Patient Active Problem List   Diagnosis Date Noted  . Indication for care in labor and delivery, antepartum 03/13/2019  . 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 Pregnancy headache, antepartum 10/31/2018  . Short interval between pregnancies affecting pregnancy, antepartum 09/04/2018  . 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 History of pospartum preeclampsia after antepartum GHTN in prior pregnancy, currently pregnant 05/10/2017  . Rubella non-immune status, antepartum 01/22/2017  . Supervision of other normal pregnancy, antepartum 01/17/2017    Assessment and Plan : Dayton MartesKabrera Hench is a 23 y.o. G2P1001 at 2245w1d here for IOL for 2/8 BPP and post-dates  1. Labor: cervix if favorable, will start low-dose pitocin, monitor BP given hx of gHTN and pp Pre-E. Obtain admission PIH labs. 2. FWB: BPP 2/8 however strip currently Cat 1/reactive. Last baby was 2807g at birth, last [place] infant 71% EFW 3906g. 3. Pain: planning for no epidural, pain management available as needed 4. GBS: negative 5. Abnormal BPP: induce, continue to monitor fetal strip closely 6. COVID swab negative 7. Depo PP, planning to breast feed   Trey SailorsPaige E Driver, MD  1/61/09609/04/2019, 4:542:34 PM   OB FELLOW ATTESTATION  I have seen and examined this patient and edited the note to reflect any changes.  Zack SealMateo Chloe Bluett, MD/[facility] OB Fellow  03/13/2019, 3:07 PM

## 2019-03-13 NOTE — Anesthesia Preprocedure Evaluation (Signed)
Anesthesia Evaluation  Patient identified by MRN, date of birth, ID band Patient awake    Reviewed: Allergy & Precautions, H&P , NPO status , Patient's Chart, lab work & pertinent test results  History of Anesthesia Complications Negative for: history of anesthetic complications  Airway Mallampati: II  TM Distance: >3 FB Neck ROM: full    Dental no notable dental hx. (+) Teeth Intact   Pulmonary asthma ,    Pulmonary exam normal breath sounds clear to auscultation       Cardiovascular hypertension, negative cardio ROS Normal cardiovascular exam Rhythm:regular Rate:Normal     Neuro/Psych negative neurological ROS  negative psych ROS   GI/Hepatic negative GI ROS, Neg liver ROS,   Endo/Other  negative endocrine ROS  Renal/GU negative Renal ROS  negative genitourinary   Musculoskeletal   Abdominal   Peds  Hematology negative hematology ROS (+)   Anesthesia Other Findings   Reproductive/Obstetrics (+) Pregnancy                             Anesthesia Physical Anesthesia Plan  ASA: II  Anesthesia Plan: Epidural   Post-op Pain Management:    Induction:   PONV Risk Score and Plan:   Airway Management Planned:   Additional Equipment:   Intra-op Plan:   Post-operative Plan:   Informed Consent: I have reviewed the patients History and Physical, chart, labs and discussed the procedure including the risks, benefits and alternatives for the proposed anesthesia with the patient or authorized representative who has indicated his/her understanding and acceptance.       Plan Discussed with:   Anesthesia Plan Comments:         Anesthesia Quick Evaluation

## 2019-03-13 NOTE — Progress Notes (Signed)
Chrisanne Loose is a 23 y.o. G2P1001 at [redacted]w[redacted]d admitted for induction of labor due to post dates and BPP 2/8.  Subjective: Contractions are becoming more painful, she is unsure as to whether or not she wants an epidural.  Objective: BP (!) 124/56   Pulse 92   Temp 98.9 F (37.2 C) (Oral)   Resp 18   LMP 06/05/2018  No intake/output data recorded.  FHT:  FHR: 130 bpm, variability: minimal to moderate,  accelerations:  Present,  decelerations:  Present (some early, occasional variable) UC:   regular, every 2-3 minutes  SVE:   Dilation: 6 Effacement (%): 100 Station: -2 Exam by:: [employer], RN   Pitocin @ 14 mu/min  Labs: Lab Results  Component Value Date   WBC 18.6 (H) 03/13/2019   HGB 9.1 (L) 03/13/2019   HCT 30.4 (L) 03/13/2019   MCV 84.4 03/13/2019   PLT 306 03/13/2019    Assessment / Plan: Induction of labor due to BPP 2/8,  progressing well on pitocin  Labor: Progressing on Pitocin, will continue to increase then AROM Fetal Wellbeing:  Category II, reassuring for some moderate variability and accels, continue to monitor closely Pain Control:  IV pain meds and considering epidural I/D:  GBS neg Anticipated MOD:  vaginal delivery  Dan Europe Imoni Kohen DO OB Fellow, Faculty Practice 03/13/2019, 10:53 PM

## 2019-03-14 ENCOUNTER — Telehealth: Payer: Self-pay | Admitting: Radiology

## 2019-03-14 ENCOUNTER — Encounter (HOSPITAL_COMMUNITY): Payer: Self-pay

## 2019-03-14 DIAGNOSIS — O283 Abnormal ultrasonic finding on antenatal screening of mother: Secondary | ICD-10-CM

## 2019-03-14 DIAGNOSIS — Z3A40 40 weeks gestation of pregnancy: Secondary | ICD-10-CM

## 2019-03-14 DIAGNOSIS — O480 Post-term pregnancy: Secondary | ICD-10-CM

## 2019-03-14 LAB — RPR: RPR Ser Ql: NONREACTIVE

## 2019-03-14 MED ORDER — WITCH HAZEL-GLYCERIN EX PADS: 1.0000 "application " | MEDICATED_PAD | CUTANEOUS | Status: DC | PRN

## 2019-03-14 MED ORDER — BUPIVACAINE HCL (PF) 0.25 % IJ SOLN
INTRAMUSCULAR | Status: DC | PRN
  Administered 2019-03-14: 8 mL via EPIDURAL

## 2019-03-14 MED ORDER — FENTANYL CITRATE (PF) 100 MCG/2ML IJ SOLN
INTRAMUSCULAR | Status: DC | PRN
  Administered 2019-03-14: 100 ug via EPIDURAL

## 2019-03-14 MED ORDER — PRENATAL MULTIVITAMIN CH
1.0000 | ORAL_TABLET | Freq: Every day | ORAL | Status: DC
  Administered 2019-03-14 – 2019-03-15 (×2): 1 via ORAL
  Filled 2019-03-14 (×2): qty 1

## 2019-03-14 MED ORDER — ACETAMINOPHEN 325 MG PO TABS
650.0000 mg | ORAL_TABLET | ORAL | Status: DC | PRN
  Administered 2019-03-14 (×2): 650 mg via ORAL
  Filled 2019-03-14 (×2): qty 2

## 2019-03-14 MED ORDER — MEASLES, MUMPS & RUBELLA VAC IJ SOLR: 0.5000 mL | Freq: Once | INTRAMUSCULAR | Status: DC

## 2019-03-14 MED ORDER — SIMETHICONE 80 MG PO CHEW: 80.0000 mg | CHEWABLE_TABLET | ORAL | Status: DC | PRN

## 2019-03-14 MED ORDER — DIBUCAINE (PERIANAL) 1 % EX OINT: 1.0000 "application " | TOPICAL_OINTMENT | CUTANEOUS | Status: DC | PRN

## 2019-03-14 MED ORDER — ONDANSETRON HCL 4 MG PO TABS: 4.0000 mg | ORAL_TABLET | ORAL | Status: DC | PRN

## 2019-03-14 MED ORDER — BENZOCAINE-MENTHOL 20-0.5 % EX AERO: 1.0000 "application " | INHALATION_SPRAY | CUTANEOUS | Status: DC | PRN

## 2019-03-14 MED ORDER — ZOLPIDEM TARTRATE 5 MG PO TABS: 5.0000 mg | ORAL_TABLET | Freq: Every evening | ORAL | Status: DC | PRN

## 2019-03-14 MED ORDER — IBUPROFEN 600 MG PO TABS
600.0000 mg | ORAL_TABLET | Freq: Four times a day (QID) | ORAL | Status: DC
  Administered 2019-03-14 – 2019-03-16 (×9): 600 mg via ORAL
  Filled 2019-03-14 (×9): qty 1

## 2019-03-14 MED ORDER — COCONUT OIL OIL
1.0000 "application " | TOPICAL_OIL | Status: DC | PRN
  Administered 2019-03-16: 1 via TOPICAL

## 2019-03-14 MED ORDER — ONDANSETRON HCL 4 MG/2ML IJ SOLN: 4.0000 mg | INTRAMUSCULAR | Status: DC | PRN

## 2019-03-14 MED ORDER — SENNOSIDES-DOCUSATE SODIUM 8.6-50 MG PO TABS
2.0000 | ORAL_TABLET | ORAL | Status: DC
  Administered 2019-03-15 (×2): 2 via ORAL
  Filled 2019-03-14 (×2): qty 2

## 2019-03-14 MED ORDER — TETANUS-DIPHTH-ACELL PERTUSSIS 5-2.5-18.5 LF-MCG/0.5 IM SUSP: 0.5000 mL | Freq: Once | INTRAMUSCULAR | Status: DC

## 2019-03-14 MED ORDER — DIPHENHYDRAMINE HCL 25 MG PO CAPS: 25.0000 mg | ORAL_CAPSULE | Freq: Four times a day (QID) | ORAL | Status: DC | PRN

## 2019-03-14 NOTE — Lactation Note (Signed)
This note was copied from a baby's chart. Lactation Consultation Note  Patient Name: Nicole Mcmahon [id number] Date: 03/14/2019 Reason for consult: Initial assessment;Term  10 hours old FT female who is being exclusively BF by his mother, she's a P2 and experienced BF. Mom was able to BF her first baby for 10 months and she's already familiar with hand expression. Mom able to do teach back with the hand expression technique, she can get small drops of colostrum out of both breasts, praised her for her efforts. Baby has caput but so far mom reported no issues with BF, he's also DAT (-).  Offered assistance with latch but mom politely declined stating that baby already fed. Asked mom to call for assistance when needed. Reviewed normal newborn behavior, feeding cues and cluster feeding. Per mom BF is going well; her only concerns was about how often to feed baby, if a 3 hour span was enough. Reinforce to mom that she should be feeding baby on feedings cues, she voiced understanding.  Feeding plan:  1. Encouraged mom to feed baby STS 8-12 times/24 hours or sooner if feeding cues are present 2. Hand expression and spoon feeding were also encouraged  BF brochure, BF resources and feeding diary were reviewed. Mom reported all questions and concerns were answered, she's aware of [place] OP services and will call PRN.  Maternal Data Formula Feeding for Exclusion: No Has patient been taught Hand Expression?: Yes Does the patient have breastfeeding experience prior to this delivery?: Yes  Feeding Feeding Type: Breast Fed   Interventions Interventions: Breast feeding basics reviewed;Hand express  Lactation Tools Discussed/Used WIC Program: No   Consult Status Consult Status: Follow-up Date: 03/15/19 Follow-up type: In-patient    Nicole Mcmahon Nicole Mcmahon 03/14/2019, 5:55 PM

## 2019-03-14 NOTE — Progress Notes (Signed)
Nicole Mcmahon is a 23 y.o. G2P1001 at [redacted]w[redacted]d admitted for induction of labor due to post-dates and BPP 2/8.  Subjective: Patient comfortable with epidural, cannot feel her epidural.  Objective: BP 127/69   Pulse (!) 108   Temp 98.9 F (37.2 C) (Oral)   Resp 18   LMP 06/05/2018   SpO2 100%  Total I/O In: -  Out: 325 [Urine:325]  FHT:  FHR: 135 bpm, variability: moderate,  accelerations:  Present,  decelerations:  Present (early decels, occasional variables) UC:   regular, every 2-3 minutes  SVE:  Dilation: complete Station: +1 Effacement: 100% Exam by: Dr. Mathis Dad  Pitocin @ 16 mu/min  Labs: Lab Results  Component Value Date   WBC 18.6 (H) 03/13/2019   HGB 9.1 (L) 03/13/2019   HCT 30.4 (L) 03/13/2019   MCV 84.4 03/13/2019   PLT 306 03/13/2019    Assessment / Plan: Induction of labor due to PD and BPP 2/8,  progressing well on pitocin  Labor: Progressing well on Pitocin. Labor down until she can feel contractions Fetal Wellbeing:  Category II, reassuring moderate variability and accels Pain Control:  Epidural I/D:  GBS neg Anticipated MOD:  vaginal delivery  Jsiah Menta L Alcides Nutting DO OB Fellow, Faculty Practice 03/14/2019, 6:18 AM

## 2019-03-14 NOTE — Lactation Note (Signed)
This note was copied from a baby's chart. Lactation Consultation Note  Patient Name: Nicole Mcmahon Date: 03/14/2019 Reason for consult: Follow-up assessment;Mother's request;Term  RN Selinda Eon call for feeding assist due to baby having difficulties latching on. Baby already nursing when entering the room, but mom had baby swaddled in a blanket and latch was somehow shallow. Asked mom if she would be willing to try STS, she agreed; baby falling asleep already. LC assisted mom with some hand expression and she was able to get several drops of colostrum prior taking baby baby to breast.  LC unwrapped baby and took him STS to mother's right breast and he was able to latch right away, with a few audible swallows noted upon breast compressions. Baby fed for a total of 18 minutes, LC had to cover baby up as soon as mom told LC that baby just had her bath. Advised mom to do STS while nursing baby instead of swaddling with the blankets until baby's temperature are taken again and he's OK to stop doing STS.  Feeding plan:  1. Encouraged mom to keep feeding baby STS 8-12 times/24 hours or sooner if feeding cues are present 2. Hand expression and spoon feeding were also encouraged  Mom reported all questions and concerns were answered, she's aware of [place] OP services and will call PRN.   Maternal Data Formula Feeding for Exclusion: No Has patient been taught Hand Expression?: Yes Does the patient have breastfeeding experience prior to this delivery?: Yes  Feeding Feeding Type: Breast Fed  LATCH Score Latch: Grasps breast easily, tongue down, lips flanged, rhythmical sucking.  Audible Swallowing: A few with stimulation  Type of Nipple: Everted at rest and after stimulation  Comfort (Breast/Nipple): Soft / non-tender  Hold (Positioning): Assistance needed to correctly position infant at breast and maintain latch.  LATCH Score: 8  Interventions Interventions: Breast feeding basics  reviewed;Assisted with latch;Skin to skin;Breast massage;Hand express;Breast compression;Adjust position  Lactation Tools Discussed/Used WIC Program: No   Consult Status Consult Status: Follow-up Date: 03/15/19 Follow-up type: In-patient    Nicole Mcmahon 03/14/2019, 9:07 PM

## 2019-03-14 NOTE — Anesthesia Postprocedure Evaluation (Signed)
Anesthesia Post Note  Patient: Anusha Claus  Procedure(s) Performed: AN AD [place]     Patient location during evaluation: Mother Baby Anesthesia Type: Epidural Level of consciousness: awake, awake and alert and oriented Pain management: pain level controlled Vital Signs Assessment: post-procedure vital signs reviewed and stable Respiratory status: spontaneous breathing Cardiovascular status: blood pressure returned to baseline Postop Assessment: no headache, no backache, able to ambulate, adequate PO intake, no apparent nausea or vomiting and patient able to bend at knees Anesthetic complications: no    Last Vitals:  Vitals:   03/14/19 1050 03/14/19 1430  BP: 125/72 123/68  Pulse: (!) 102 98  Resp: 18 18  Temp: 37.2 C 37.1 C  SpO2:      Last Pain:  Vitals:   03/14/19 1430  TempSrc: Oral  PainSc: 0-No pain   Pain Goal: Patients Stated Pain Goal: 7 (03/13/19 1830)                 Kathie Rhodes

## 2019-03-14 NOTE — Telephone Encounter (Signed)
Left message for patient that mychart postpartum appointment has been scheduled

## 2019-03-14 NOTE — Discharge Summary (Signed)
OB Discharge Summary     Patient Name: Nicole Mcmahon DOB: 08-Apr-1996 MRN: [id number]  Date of admission: 03/13/2019 Delivering MD: Unk Lightning, DO  Date of discharge: 03/16/2019  Admitting diagnosis: Direct Admit Intrauterine pregnancy: [redacted]w[redacted]d    Secondary diagnosis:  Active Problems:   Rubella non-immune status, antepartum   History of pospartum preeclampsia after antepartum GHTN in prior pregnancy, currently pregnant   Indication for care in labor and delivery, antepartum  Additional problems: BPP 2/8     Discharge diagnosis: Term Pregnancy Delivered                                                                                                Post partum procedures:None - patient declined all vaccines during pregnancy and continued to decline MMR postpartum, despite conversation about possible effects on future pregnancies, up to, and including, fetal death.  Augmentation: AROM and Pitocin  Complications: None  Hospital course:  Induction of Labor With Vaginal Delivery   23y.o. yo G2P1001 at 428w2das admitted to the hospital 03/13/2019 for induction of labor.  Indication for induction: Postdates and BPP 2/8.  Patient had an uncomplicated labor course. She received low-dose pitocin, then she was AROMed. Under epidural anesthesia, she delivered as follows: Membrane Rupture Time/Date: 11:44 PM ,03/13/2019   Intrapartum Procedures: Episiotomy: None [1] none                                        Lacerations:  None [1] perineal abrasions that did not require repaire Patient had delivery of a Viable infant.  Information for the patient's newborn:  MoOlayinka, Gathers0[[id number]]Delivery Method: Vag-Spont    03/14/2019  Details of delivery can be found in separate delivery note.  Patient had a routine postpartum course. Patient is discharged home 03/16/19.  Physical exam  Vitals:   03/15/19 1522 03/15/19 2241 03/16/19 0515 03/16/19 0945  BP: 134/83  (!) 148/94 127/69 (!) 142/77  Pulse:  91 (!) 115 86  Resp:  18 18   Temp:  98.5 F (36.9 C) 98.6 F (37 C)   TempSrc:  Oral Oral   SpO2:  100% 100%    General: alert, cooperative and no distress Lochia: appropriate Uterine Fundus: firm Incision: N/A DVT Evaluation: No evidence of DVT seen on physical exam. No cords or calf tenderness. No significant calf/ankle edema. Labs: Lab Results  Component Value Date   WBC 19.1 (H) 03/15/2019   HGB 8.0 (L) 03/15/2019   HCT 26.8 (L) 03/15/2019   MCV 86.2 03/15/2019   PLT 277 03/15/2019   CMP Latest Ref Rng & Units 03/13/2019  Glucose 70 - 99 mg/dL 73  BUN 6 - 20 mg/dL <5(L)  Creatinine 0.44 - 1.00 mg/dL 0.57  Sodium 135 - 145 mmol/L 136  Potassium 3.5 - 5.1 mmol/L 4.0  Chloride 98 - 111 mmol/L 105  CO2 22 - 32 mmol/L 20(L)  Calcium 8.9 - 10.3 mg/dL 9.2  Total Protein 6.5 -  8.1 g/dL 6.5  Total Bilirubin 0.3 - 1.2 mg/dL 0.4  Alkaline Phos 38 - 126 U/L 233(H)  AST 15 - 41 U/L 24  ALT 0 - 44 U/L 13    Discharge instruction: per After Visit Summary and "Baby and Me Booklet".  After visit meds:  Allergies as of 03/16/2019   No Known Allergies     Medication List    TAKE these medications   acetaminophen 325 MG tablet Commonly known as: Tylenol Take 2 tablets (650 mg total) by mouth every 6 (six) hours as needed for up to 30 doses.   amLODipine 5 MG tablet Commonly known as: NORVASC Take 1 tablet (5 mg total) by mouth daily. Start taking on: March 17, 2019   aspirin EC 81 MG tablet Take 1 tablet (81 mg total) by mouth daily. Take after 12 weeks for prevention of preeclampsia later in pregnancy   ferrous sulfate 325 (65 FE) MG tablet Take 1 tablet (325 mg total) by mouth daily.   ibuprofen 600 MG tablet Commonly known as: ADVIL Take 1 tablet (600 mg total) by mouth every 6 (six) hours as needed.   prenatal multivitamin Tabs tablet Take 1 tablet by mouth daily at 12 noon.   senna 8.6 MG Tabs tablet Commonly  known as: SENOKOT Take 1 tablet (8.6 mg total) by mouth daily as needed for mild constipation.       Diet: routine diet  Activity: Advance as tolerated. Pelvic rest for 6 weeks.   Outpatient follow up:4 weeks Follow up Appt: Please schedule this patient for Postpartum visit in: 4 weeks with the following provider: Any provider For C/S patients schedule nurse incision check in weeks 2 weeks: no Low risk pregnancy complicated by: Post Dates, BPP 2/8, hx of preeclampsia, short interpregnancy interval, LGSIL, refusal of multiple vaccines Delivery mode:  SVD Anticipated Birth Control:  Depo PP Procedures needed: none  Schedule Integrated Luis M. Cintron visit: no  -message sent to [facility] for BP check in one week on 03/16/2019 as patient was started on Norvasc postpartum.   Future Appointments  Date Time Provider [place]  04/10/2019  1:15 PM Anyanwu, Sallyanne Havers, MD [facility]   Follow up Visit:No follow-ups on file.  Postpartum contraception: Depo Provera  Newborn Data: Live born female  Birth Weight:   APGAR: ,   Newborn Delivery   Birth date/time: 03/14/2019 07:54:00 Delivery type:       Baby Feeding: Breast Disposition:home with mother   03/16/2019 Clarisa Fling, NP

## 2019-03-14 NOTE — Progress Notes (Signed)
Nicole Mcmahon is a 23 y.o. G2P1001 at [redacted]w[redacted]d admitted for induction of labor due to post dates and BPP 2/8.  Subjective: Comfortable with epidural, would like AROM.   Objective: BP 123/83   Pulse (!) 106   Temp 98.9 F (37.2 C) (Oral)   Resp 18   LMP 06/05/2018   SpO2 100%  No intake/output data recorded.  FHT:  FHR: 135 bpm, variability: moderate,  accelerations:  Abscent,  decelerations:  Present early UC:   regular, every 2-3 minutes  SVE:   Dilation: 7.5 Effacement (%): 100 Station: -1, 0 Exam by:: amber pope, rn   Pitocin @ 14 mu/min  Labs: Lab Results  Component Value Date   WBC 18.6 (H) 03/13/2019   HGB 9.1 (L) 03/13/2019   HCT 30.4 (L) 03/13/2019   MCV 84.4 03/13/2019   PLT 306 03/13/2019    Assessment / Plan: Induction of labor due to BPP 2/8/postdates,  progressing well on pitocin  Labor: Progressing on Pitocin, AROM with clear fluids at 2344 Fetal Wellbeing:  Category II, reassuring for some moderate variability and accels. Continue to monitor closely Pain Control:  Epidural I/D:  GBS neg Anticipated MOD:  vaginal delivery  Keyshon Stein L Rethel Sebek DO OB Fellow, Faculty Practice 03/14/2019, 12:12 AM

## 2019-03-15 LAB — CBC
HCT: 26.8 % — ABNORMAL LOW (ref 36.0–46.0)
Hemoglobin: 8 g/dL — ABNORMAL LOW (ref 12.0–15.0)
MCH: 25.7 pg — ABNORMAL LOW (ref 26.0–34.0)
MCHC: 29.9 g/dL — ABNORMAL LOW (ref 30.0–36.0)
MCV: 86.2 fL (ref 80.0–100.0)
Platelets: 277 10*3/uL (ref 150–400)
RBC: 3.11 MIL/uL — ABNORMAL LOW (ref 3.87–5.11)
RDW: 15.5 % (ref 11.5–15.5)
WBC: 19.1 10*3/uL — ABNORMAL HIGH (ref 4.0–10.5)
nRBC: 0.1 % (ref 0.0–0.2)

## 2019-03-15 MED ORDER — MEASLES, MUMPS & RUBELLA VAC IJ SOLR: 0.5000 mL | Freq: Once | INTRAMUSCULAR | Status: DC

## 2019-03-15 MED ORDER — MEDROXYPROGESTERONE ACETATE 150 MG/ML IM SUSP
150.0000 mg | Freq: Once | INTRAMUSCULAR | Status: AC
  Administered 2019-03-15: 150 mg via INTRAMUSCULAR
  Filled 2019-03-15: qty 1

## 2019-03-15 MED ORDER — AMLODIPINE BESYLATE 5 MG PO TABS
5.0000 mg | ORAL_TABLET | Freq: Every day | ORAL | Status: DC
  Administered 2019-03-15 – 2019-03-16 (×2): 5 mg via ORAL
  Filled 2019-03-15 (×3): qty 1

## 2019-03-15 NOTE — Lactation Note (Signed)
This note was copied from a baby's chart. Lactation Consultation Note  Patient Name: Nicole Mcmahon [id number] Date: 03/15/2019 Reason for consult: Follow-up assessment;Term Baby is 30 hours old/4% weight loss.  Mom reports that baby is latching easily.  Baby is cluster feeding.  Reassured and instructed to feed with cues.  Instructed on breast massage and compression during feeding to increase milk flow.  Encouraged to call for assist prn.  Maternal Data    Feeding    LATCH Score                   Interventions    Lactation Tools Discussed/Used     Consult Status Consult Status: Follow-up Date: 03/16/19    Ave Filter 03/15/2019, 2:24 PM

## 2019-03-15 NOTE — Progress Notes (Signed)
Patient given MMR VIS. 

## 2019-03-15 NOTE — Plan of Care (Signed)
Pts. Condition will continue to improve 

## 2019-03-15 NOTE — Progress Notes (Signed)
Post Partum Day 1  Subjective: no complaints, up ad lib, voiding, tolerating PO and + flatus  Objective: Blood pressure 139/73, pulse 97, temperature 98.1 F (36.7 C), resp. rate 18, last menstrual period 06/05/2018, SpO2 100 %, unknown if currently breastfeeding.  Patient Vitals for the past 24 hrs:  BP Temp Temp src Pulse Resp SpO2  03/15/19 1354 139/73 - - - - -  03/15/19 1353 139/73 - - 97 - 100 %  03/15/19 0600 133/85 98.1 F (36.7 C) - 71 18 100 %  03/14/19 2105 (!) 145/80 98.2 F (36.8 C) Axillary 95 16 100 %  03/14/19 1811 123/67 98.2 F (36.8 C) Oral 68 18 100 %  03/14/19 1430 123/68 98.8 F (37.1 C) Oral 98 18 -   Physical Exam:  General: alert, cooperative and no distress Lochia: appropriate Uterine Fundus: firm Incision: N/A DVT Evaluation: No evidence of DVT seen on physical exam. No cords or calf tenderness. No significant calf/ankle edema.  Recent Labs    03/13/19 1258 03/15/19 0455  HGB 9.1* 8.0*  HCT 30.4* 26.8*    Assessment/Plan: Plan for discharge tomorrow, Breastfeeding and Contraception Depo  Started on Norvasc 5mg  today, per consultation with Dr. Rosana Hoes.   LOS: 2 days   Nicole Mcmahon 03/15/2019, 2:08 PM

## 2019-03-16 MED ORDER — PRENATAL MULTIVITAMIN CH: 1.0000 | ORAL_TABLET | Freq: Every day | ORAL | 3 refills | Status: DC

## 2019-03-16 MED ORDER — FERROUS SULFATE 325 (65 FE) MG PO TABS: 325.0000 mg | ORAL_TABLET | Freq: Every day | ORAL | 3 refills | Status: DC

## 2019-03-16 MED ORDER — SENNA 8.6 MG PO TABS: 1.0000 | ORAL_TABLET | Freq: Every day | ORAL | 1 refills | Status: DC | PRN

## 2019-03-16 MED ORDER — ACETAMINOPHEN 325 MG PO TABS: 650.0000 mg | ORAL_TABLET | Freq: Four times a day (QID) | ORAL | 0 refills | Status: DC | PRN

## 2019-03-16 MED ORDER — IBUPROFEN 600 MG PO TABS: 600.0000 mg | ORAL_TABLET | Freq: Four times a day (QID) | ORAL | 0 refills | Status: DC | PRN

## 2019-03-16 MED ORDER — AMLODIPINE BESYLATE 5 MG PO TABS: 5.0000 mg | ORAL_TABLET | Freq: Every day | ORAL | 1 refills | Status: DC

## 2019-03-16 NOTE — Discharge Instructions (Signed)
Preeclampsia and Eclampsia °Preeclampsia is a serious condition that may develop during pregnancy. This condition causes high blood pressure and increased protein in your urine along with other symptoms, such as headaches and vision changes. These symptoms may develop as the condition gets worse. Preeclampsia may occur at 20 weeks of pregnancy or later. °Diagnosing and treating preeclampsia early is very important. If not treated early, it can cause serious problems for you and your baby. One problem it can lead to is eclampsia. Eclampsia is a condition that causes muscle jerking or shaking (convulsions or seizures) and other serious problems for the mother. During pregnancy, delivering your baby may be the best treatment for preeclampsia or eclampsia. For most women, preeclampsia and eclampsia symptoms go away after giving birth. °In rare cases, a woman may develop preeclampsia after giving birth (postpartum preeclampsia). This usually occurs within 48 hours after childbirth but may occur up to 6 weeks after giving birth. °What are the causes? °The cause of preeclampsia is not known. °What increases the risk? °The following risk factors make you more likely to develop preeclampsia: °· Being pregnant for the first time. °· Having had preeclampsia during a past pregnancy. °· Having a family history of preeclampsia. °· Having high blood pressure. °· Being pregnant with more than one baby. °· Being 35 or older. °· Being African-American. °· Having kidney disease or diabetes. °· Having medical conditions such as lupus or blood diseases. °· Being very overweight (obese). °What are the signs or symptoms? °The most common symptoms are: °· Severe headaches. °· Vision problems, such as blurred or double vision. °· Abdominal pain, especially upper abdominal pain. °Other symptoms that may develop as the condition gets worse include: °· Sudden weight gain. °· Sudden swelling of the hands, face, legs, and feet. °· Severe nausea  and vomiting. °· Numbness in the face, arms, legs, and feet. °· Dizziness. °· Urinating less than usual. °· Slurred speech. °· Convulsions or seizures. °How is this diagnosed? °There are no screening tests for preeclampsia. Your health care provider will ask you about symptoms and check for signs of preeclampsia during your prenatal visits. You may also have tests that include: °· Checking your blood pressure. °· Urine tests to check for protein. Your health care provider will check for this at every prenatal visit. °· Blood tests. °· Monitoring your baby's heart rate. °· Ultrasound. °How is this treated? °You and your health care provider will determine the treatment approach that is best for you. Treatment may include: °· Having more frequent prenatal exams to check for signs of preeclampsia, if you have an increased risk for preeclampsia. °· Medicine to lower your blood pressure. °· Staying in the hospital, if your condition is severe. There, treatment will focus on controlling your blood pressure and the amount of fluids in your body (fluid retention). °· Taking medicine (magnesium sulfate) to prevent seizures. This may be given as an injection or through an IV. °· Taking a low-dose aspirin during your pregnancy. °· Delivering your baby early. You may have your labor started with medicine (induced), or you may have a cesarean delivery. °Follow these instructions at home: °Eating and drinking ° °· Drink enough fluid to keep your urine pale yellow. °· Avoid caffeine. °Lifestyle °· Do not use any products that contain nicotine or tobacco, such as cigarettes and e-cigarettes. If you need help quitting, ask your health care provider. °· Do not use alcohol or drugs. °· Avoid stress as much as possible. Rest and get   plenty of sleep. General instructions  Take over-the-counter and prescription medicines only as told by your health care provider.  When lying down, lie on your left side. This keeps pressure off your  major blood vessels.  When sitting or lying down, raise (elevate) your feet. Try putting some pillows underneath your lower legs.  Exercise regularly. Ask your health care provider what kinds of exercise are best for you.  Keep all follow-up and prenatal visits as told by your health care provider. This is important. How is this prevented? There is no known way of preventing preeclampsia or eclampsia from developing. However, to lower your risk of complications and detect problems early:  Get regular prenatal care. Your health care provider may be able to diagnose and treat the condition early.  Maintain a healthy weight. Ask your health care provider for help managing weight gain during pregnancy.  Work with your health care provider to manage any long-term (chronic) health conditions you have, such as diabetes or kidney problems.  You may have tests of your blood pressure and kidney function after giving birth.  Your health care provider may have you take low-dose aspirin during your next pregnancy. Contact a health care provider if:  You have symptoms that your health care provider told you may require more treatment or monitoring, such as: ? Headaches. ? Nausea or vomiting. ? Abdominal pain. ? Dizziness. ? Light-headedness. Get help right away if:  You have severe: ? Abdominal pain. ? Headaches that do not get better. ? Dizziness. ? Vision problems. ? Confusion. ? Nausea or vomiting.  You have any of the following: ? A seizure. ? Sudden, rapid weight gain. ? Sudden swelling in your hands, ankles, or face. ? Trouble moving any part of your body. ? Numbness in any part of your body. ? Trouble speaking. ? Abnormal bleeding.  You faint. Summary  Preeclampsia is a serious condition that may develop during pregnancy.  This condition causes high blood pressure and increased protein in your urine along with other symptoms, such as headaches and vision  changes.  Diagnosing and treating preeclampsia early is very important. If not treated early, it can cause serious problems for you and your baby.  Get help right away if you have symptoms that your health care provider told you to watch for. This information is not intended to replace advice given to you by your health care provider. Make sure you discuss any questions you have with your health care provider. Document Released: 06/16/2000 Document Revised: 02/19/2018 Document Reviewed: 01/24/2016 Elsevier Patient Education  2020 [place]. Postpartum Care After Vaginal Delivery This sheet gives you information about how to care for yourself from the time you deliver your baby to up to 6-12 weeks after delivery (postpartum period). Your health care provider may also give you more specific instructions. If you have problems or questions, contact your health care provider. Follow these instructions at home: Vaginal bleeding  It is normal to have vaginal bleeding (lochia) after delivery. Wear a sanitary pad for vaginal bleeding and discharge. ? During the first week after delivery, the amount and appearance of lochia is often similar to a menstrual period. ? Over the next few weeks, it will gradually decrease to a dry, yellow-brown discharge. ? For most women, lochia stops completely by 4-6 weeks after delivery. Vaginal bleeding can vary from woman to woman.  Change your sanitary pads frequently. Watch for any changes in your flow, such as: ? A sudden increase in volume. ?  volume. °? A change in color. °? Large blood clots. °· If you pass a blood clot from your vagina, save it and call your health care provider to discuss. Do not flush blood clots down the toilet before talking with your health care provider. °· Do not use tampons or douches until your health care provider says this is safe. °· If you are not breastfeeding, your period should return 6-8 weeks after delivery. If you are feeding your child  breast milk only (exclusive breastfeeding), your period may not return until you stop breastfeeding. °Perineal care °· Keep the area between the vagina and the anus (perineum) clean and dry as told by your health care provider. Use medicated pads and pain-relieving sprays and creams as directed. °· If you had a cut in the perineum (episiotomy) or a tear in the vagina, check the area for signs of infection until you are healed. Check for: °? More redness, swelling, or pain. °? Fluid or blood coming from the cut or tear. °? Warmth. °? Pus or a bad smell. °· You may be given a squirt bottle to use instead of wiping to clean the perineum area after you go to the bathroom. As you start healing, you may use the squirt bottle before wiping yourself. Make sure to wipe gently. °· To relieve pain caused by an episiotomy, a tear in the vagina, or swollen veins in the anus (hemorrhoids), try taking a warm sitz bath 2-3 times a day. A sitz bath is a warm water bath that is taken while you are sitting down. The water should only come up to your hips and should cover your buttocks. °Breast care °· Within the first few days after delivery, your breasts may feel heavy, full, and uncomfortable (breast engorgement). Milk may also leak from your breasts. Your health care provider can suggest ways to help relieve the discomfort. Breast engorgement should go away within a few days. °· If you are breastfeeding: °? Wear a bra that supports your breasts and fits you well. °? Keep your nipples clean and dry. Apply creams and ointments as told by your health care provider. °? You may need to use breast pads to absorb milk that leaks from your breasts. °? You may have uterine contractions every time you breastfeed for up to several weeks after delivery. Uterine contractions help your uterus return to its normal size. °? If you have any problems with breastfeeding, work with your health care provider or lactation consultant. °· If you are not  breastfeeding: °? Avoid touching your breasts a lot. Doing this can make your breasts produce more milk. °? Wear a good-fitting bra and use cold packs to help with swelling. °? Do not squeeze out (express) milk. This causes you to make more milk. °Intimacy and sexuality °· Ask your health care provider when you can engage in sexual activity. This may depend on: °? Your risk of infection. °? How fast you are healing. °? Your comfort and desire to engage in sexual activity. °· You are able to get pregnant after delivery, even if you have not had your period. If desired, talk with your health care provider about methods of birth control (contraception). °Medicines °· Take over-the-counter and prescription medicines only as told by your health care provider. °· If you were prescribed an antibiotic medicine, take it as told by your health care provider. Do not stop taking the antibiotic even if you start to feel better. °Activity °· Gradually return to your   normal activities as told by your health care provider. Ask your health care provider what activities are safe for you. °· Rest as much as possible. Try to rest or take a nap while your baby is sleeping. °Eating and drinking ° °· Drink enough fluid to keep your urine pale yellow. °· Eat high-fiber foods every day. These may help prevent or relieve constipation. High-fiber foods include: °? Whole grain cereals and breads. °? Brown rice. °? Beans. °? Fresh fruits and vegetables. °· Do not try to lose weight quickly by cutting back on calories. °· Take your prenatal vitamins until your postpartum checkup or until your health care provider tells you it is okay to stop. °Lifestyle °· Do not use any products that contain nicotine or tobacco, such as cigarettes and e-cigarettes. If you need help quitting, ask your health care provider. °· Do not drink alcohol, especially if you are breastfeeding. °General instructions °· Keep all follow-up visits for you and your baby as  told by your health care provider. Most women visit their health care provider for a postpartum checkup within the first 3-6 weeks after delivery. °Contact a health care provider if: °· You feel unable to cope with the changes that your child brings to your life, and these feelings do not go away. °· You feel unusually sad or worried. °· Your breasts become red, painful, or hard. °· You have a fever. °· You have trouble holding urine or keeping urine from leaking. °· You have little or no interest in activities you used to enjoy. °· You have not breastfed at all and you have not had a menstrual period for 12 weeks after delivery. °· You have stopped breastfeeding and you have not had a menstrual period for 12 weeks after you stopped breastfeeding. °· You have questions about caring for yourself or your baby. °· You pass a blood clot from your vagina. °Get help right away if: °· You have chest pain. °· You have difficulty breathing. °· You have sudden, severe leg pain. °· You have severe pain or cramping in your lower abdomen. °· You bleed from your vagina so much that you fill more than one sanitary pad in one hour. Bleeding should not be heavier than your heaviest period. °· You develop a severe headache. °· You faint. °· You have blurred vision or spots in your vision. °· You have bad-smelling vaginal discharge. °· You have thoughts about hurting yourself or your baby. °If you ever feel like you may hurt yourself or others, or have thoughts about taking your own life, get help right away. You can go to the nearest emergency department or call: °· Your local emergency services (911 in the [place] °· A suicide crisis helpline, such as the National Suicide Prevention Lifeline at 1-800-273-8255. This is open 24 hours a day. °Summary °· The period of time right after you deliver your newborn up to 6-12 weeks after delivery is called the postpartum period. °· Gradually return to your normal activities as told by your  health care provider. °· Keep all follow-up visits for you and your baby as told by your health care provider. °This information is not intended to replace advice given to you by your health care provider. Make sure you discuss any questions you have with your health care provider. °Document Released: 04/16/2007 Document Revised: 06/22/2017 Document Reviewed: 04/02/2017 °Elsevier Patient Education © 2020 [employer] Inc[employer] ° °

## 2019-03-16 NOTE — Lactation Note (Signed)
This note was copied from a baby's chart. Lactation Consultation Note  Patient Name: Nicole Mcmahon MOLMB'E Date: 03/16/2019 Reason for consult: Follow-up assessment   P2, Ex BF for one year. Baby 101 hours old and sucking on pacifier while mother took shower. Provided education regarding waiting until 3 weeks to introduce pacifier. Baby latched easily with intermittent swallows. Mother denies questions or concerns. Feed on demand with cues.  Goal 8-12+ times per day after first 24 hrs.  Place baby STS if not cueing.  Reviewed engorgement care and monitoring voids/stools.     Maternal Data    Feeding Feeding Type: Breast Fed  LATCH Score Latch: Grasps breast easily, tongue down, lips flanged, rhythmical sucking.  Audible Swallowing: A few with stimulation  Type of Nipple: Everted at rest and after stimulation  Comfort (Breast/Nipple): Soft / non-tender  Hold (Positioning): No assistance needed to correctly position infant at breast.  LATCH Score: 9  Interventions Interventions: Breast feeding basics reviewed  Lactation Tools Discussed/Used     Consult Status Consult Status: Complete Date: 03/16/19    Vivianne Master [facility] [employer] 03/16/2019, 8:43 AM

## 2019-03-17 ENCOUNTER — Other Ambulatory Visit (HOSPITAL_COMMUNITY)
Admission: RE | Admit: 2019-03-17 | Discharge: 2019-03-17 | Disposition: A | Discharge status: Home / Self Care | Payer: Medicaid Other | Source: Ambulatory Visit

## 2019-03-17 NOTE — Telephone Encounter (Signed)
error 

## 2019-03-18 ENCOUNTER — Telehealth: Payer: Medicaid Other | Admitting: Obstetrics and Gynecology

## 2019-03-19 ENCOUNTER — Inpatient Hospital Stay (HOSPITAL_COMMUNITY): Payer: Medicaid Other

## 2019-03-20 ENCOUNTER — Ambulatory Visit (HOSPITAL_COMMUNITY): Payer: Medicaid Other

## 2019-03-27 ENCOUNTER — Inpatient Hospital Stay (HOSPITAL_COMMUNITY): Payer: Medicaid Other

## 2019-04-10 ENCOUNTER — Ambulatory Visit: Payer: Medicaid Other | Admitting: Obstetrics & Gynecology

## 2019-04-10 ENCOUNTER — Telehealth: Payer: Medicaid Other | Admitting: Obstetrics & Gynecology

## 2019-04-10 ENCOUNTER — Telehealth: Payer: Self-pay | Admitting: Radiology

## 2019-04-10 NOTE — Telephone Encounter (Signed)
Called patient for virtual visit with Dr Harolyn Rutherford, recording states that her number can not accept calls at this time.

## 2019-04-23 ENCOUNTER — Telehealth: Payer: Self-pay | Admitting: *Deleted

## 2019-04-23 NOTE — Telephone Encounter (Signed)
Pt called to let us know she has been having vaginal bleeding since her delivery and it has not gotten better. Pt received depo postpartum and is breast feeding. Pt states her bleeding has never really resolved, denies any clots or heavy bleeding. Spoke with Dr Ernestina Patches, and pt should come in to be evaluated. Made pt an appointment for 10/22.

## 2019-04-24 ENCOUNTER — Ambulatory Visit: Payer: Medicaid Other | Admitting: Obstetrics and Gynecology

## 2019-05-22 ENCOUNTER — Ambulatory Visit: Payer: Medicaid Other | Admitting: Obstetrics and Gynecology

## 2019-05-22 ENCOUNTER — Encounter: Payer: Self-pay | Admitting: Obstetrics and Gynecology

## 2019-05-23 NOTE — Progress Notes (Signed)
Patient did not keep her GYN appointment for 05/22/2019.  Durene Romans MD Attending Center for [employer] [facility])

## 2019-06-04 ENCOUNTER — Other Ambulatory Visit: Payer: Self-pay

## 2019-06-04 ENCOUNTER — Ambulatory Visit (INDEPENDENT_AMBULATORY_CARE_PROVIDER_SITE_OTHER): Payer: Medicaid Other

## 2019-06-04 ENCOUNTER — Other Ambulatory Visit: Payer: Self-pay | Admitting: *Deleted

## 2019-06-04 VITALS — BP 112/78 | HR 72

## 2019-06-04 DIAGNOSIS — Z3042 Encounter for surveillance of injectable contraceptive: Secondary | ICD-10-CM | POA: Diagnosis not present

## 2019-06-04 MED ORDER — MEDROXYPROGESTERONE ACETATE 150 MG/ML IM SUSP
150.0000 mg | Freq: Once | INTRAMUSCULAR | Status: AC
  Administered 2019-06-04: 150 mg via INTRAMUSCULAR

## 2019-06-04 MED ORDER — MEDROXYPROGESTERONE ACETATE 150 MG/ML IM SUSP: 150.0000 mg | INTRAMUSCULAR | 3 refills | Status: DC

## 2019-06-04 NOTE — Progress Notes (Signed)
Patient presented to the office today for her depo-provera injection. Given by: D.Phillip Heal CMA  Dose: 150 MG HCG Serum: N/A Patient to follow up in three months for next injection.  Side Effects: None at this time

## 2019-06-05 NOTE — Progress Notes (Signed)
Patient seen and assessed by nursing staff during this encounter. I have reviewed the chart and agree with the documentation and plan.  Everest Brod, MD 06/05/2019 8:24 AM    

## 2019-08-20 ENCOUNTER — Other Ambulatory Visit: Payer: Self-pay

## 2019-08-20 ENCOUNTER — Ambulatory Visit: Payer: Medicaid Other

## 2019-08-20 MED ORDER — MEDROXYPROGESTERONE ACETATE 150 MG/ML IM SUSP: 150.0000 mg | INTRAMUSCULAR | 3 refills | Status: DC

## 2019-08-27 ENCOUNTER — Ambulatory Visit (INDEPENDENT_AMBULATORY_CARE_PROVIDER_SITE_OTHER): Payer: Medicaid Other

## 2019-08-27 ENCOUNTER — Other Ambulatory Visit: Payer: Self-pay

## 2019-08-27 VITALS — BP 116/72 | HR 78

## 2019-08-27 DIAGNOSIS — Z3042 Encounter for surveillance of injectable contraceptive: Secondary | ICD-10-CM | POA: Diagnosis not present

## 2019-08-27 MED ORDER — MEDROXYPROGESTERONE ACETATE 150 MG/ML IM SUSP
150.0000 mg | Freq: Once | INTRAMUSCULAR | Status: AC
  Administered 2019-08-27: 150 mg via INTRAMUSCULAR

## 2019-08-27 NOTE — Progress Notes (Signed)
Patient presented to office today for depo-provera injection.  Dose: 150 mg Given in left deltoid muscle IM. Side Effects: None at this time NDC# 5374-8270-78 Patient will return for next injection 05/12/-05/26

## 2019-11-12 ENCOUNTER — Ambulatory Visit: Payer: Medicaid Other

## 2019-11-13 ENCOUNTER — Ambulatory Visit (INDEPENDENT_AMBULATORY_CARE_PROVIDER_SITE_OTHER): Payer: Medicaid Other

## 2019-11-13 ENCOUNTER — Other Ambulatory Visit: Payer: Self-pay

## 2019-11-13 VITALS — BP 119/74 | HR 72

## 2019-11-13 DIAGNOSIS — Z3042 Encounter for surveillance of injectable contraceptive: Secondary | ICD-10-CM

## 2019-11-13 MED ORDER — MEDROXYPROGESTERONE ACETATE 150 MG/ML IM SUSP
150.0000 mg | Freq: Once | INTRAMUSCULAR | Status: AC
  Administered 2019-11-13: 150 mg via INTRAMUSCULAR

## 2019-11-13 MED ORDER — MEDROXYPROGESTERONE ACETATE 150 MG/ML IM SUSP: 150.0000 mg | INTRAMUSCULAR | 0 refills | Status: DC

## 2019-11-13 NOTE — Progress Notes (Signed)
Patient presented to the office today for her depo-provera injection given in left arm IM. 150MG .  HCG Serum:N/A Side Effects: None Follow Up:07/29-08/12  NDC # 12-04-1984

## 2019-11-18 NOTE — Progress Notes (Signed)
Patient was assessed and managed by nursing staff during this encounter. I have reviewed the chart and agree with the documentation and plan. I have also made any necessary editorial changes.  Afton Bing, MD 11/18/2019 7:48 AM

## 2019-12-27 IMAGING — US US MFM OB FOLLOW-UP
1 series · 13 of 28 positions shown · non-contrast
Comparison: none

[Series 1: us mfm ob follow-up · 13 of 56 slices shown]
[im 3/56]
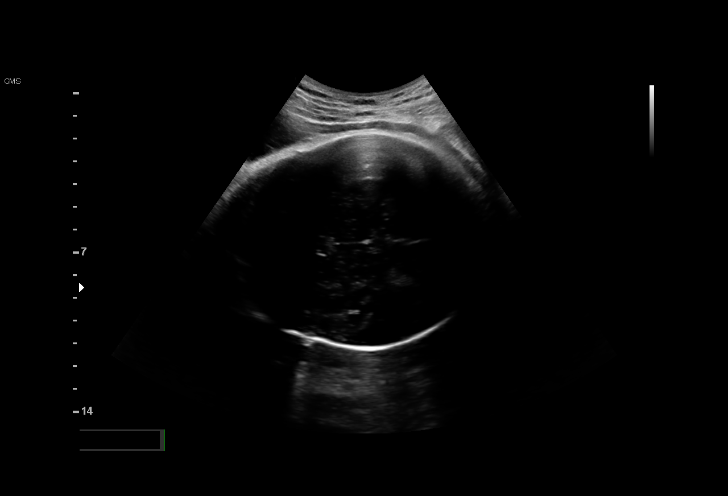
[im 7/56]
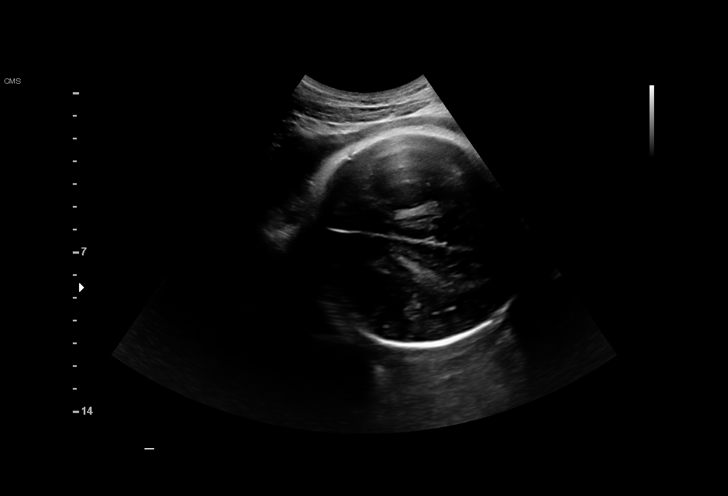
[im 11/56]
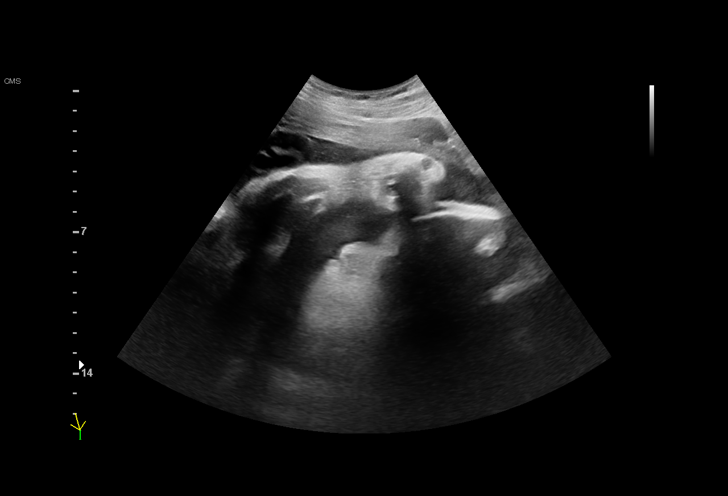
[im 15/56]
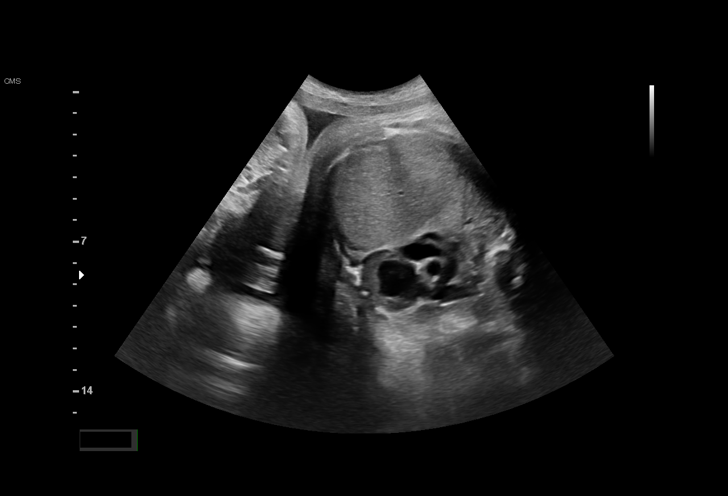
[im 19/56]
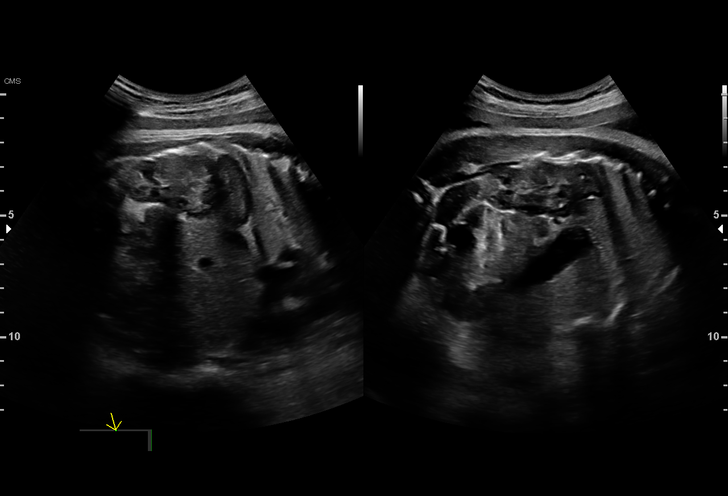
[im 23/56]
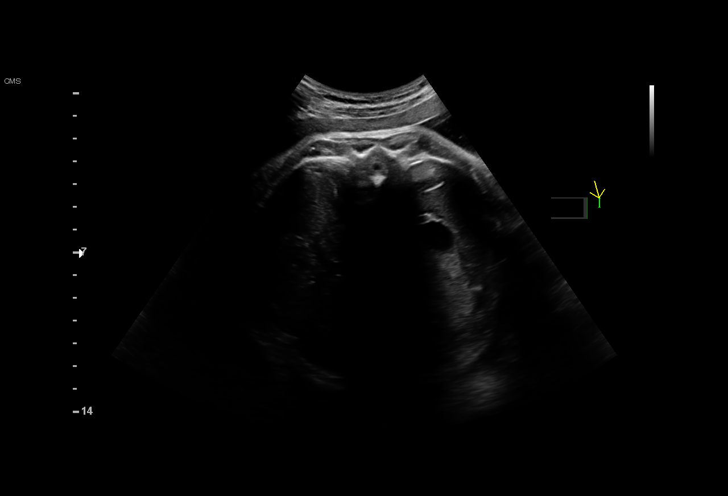
[im 29/56]
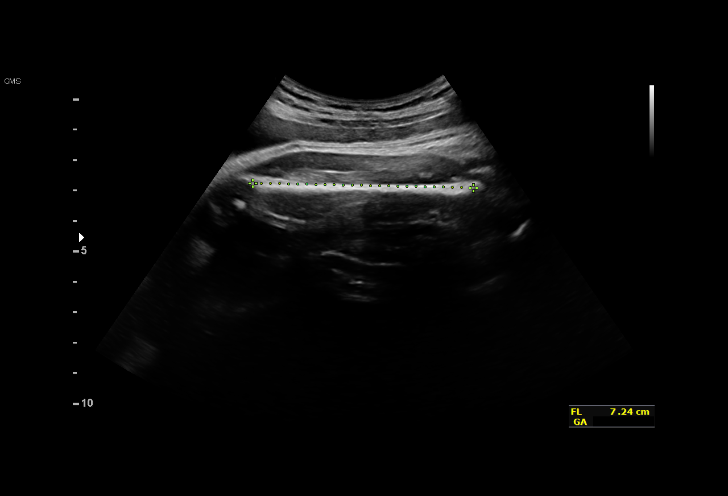
[im 33/56]
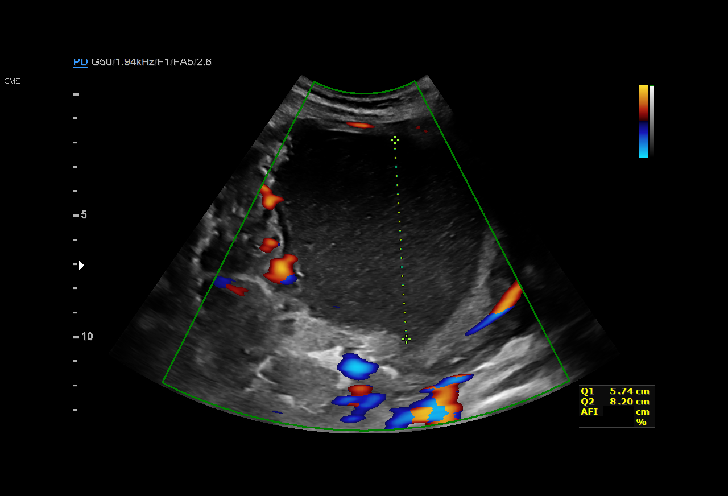
[im 37/56]
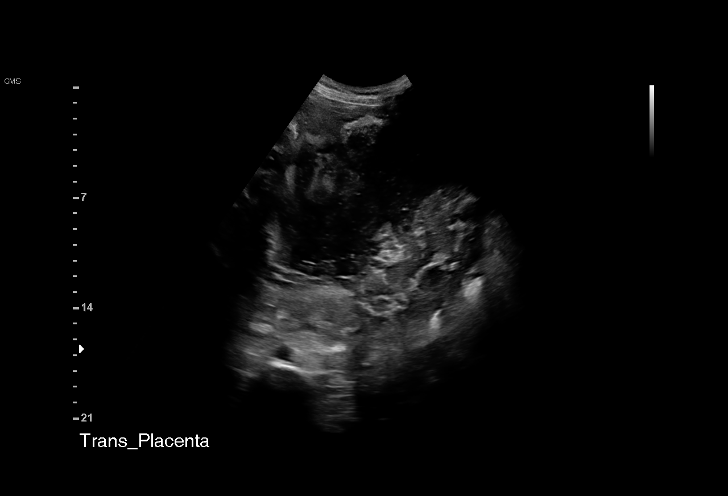
[im 41/56]
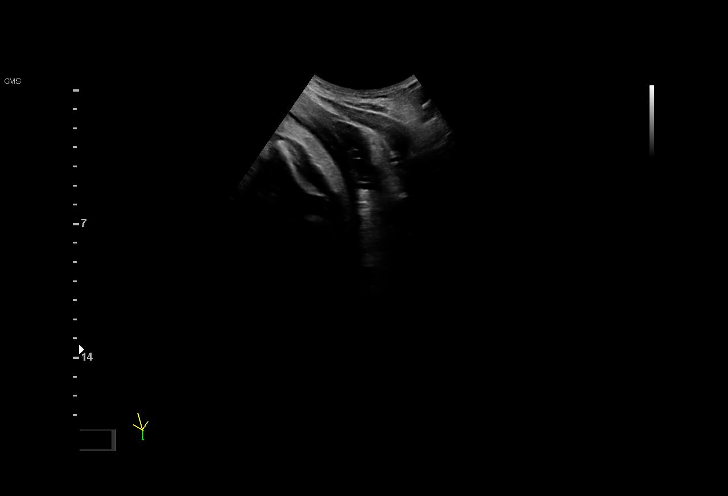
[im 45/56]
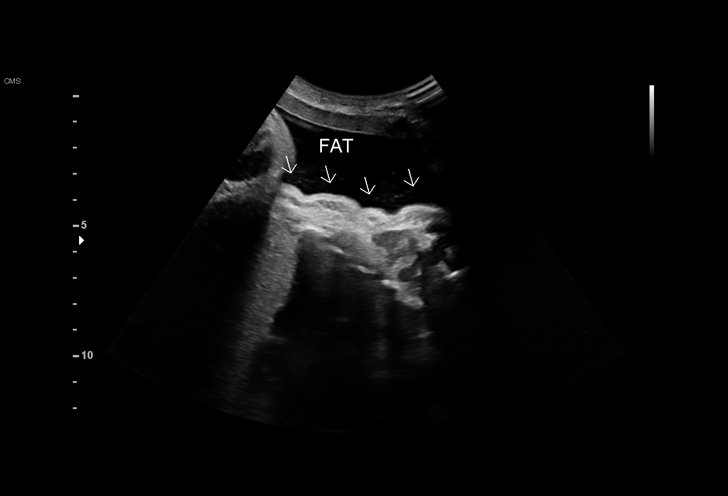
[im 49/56]
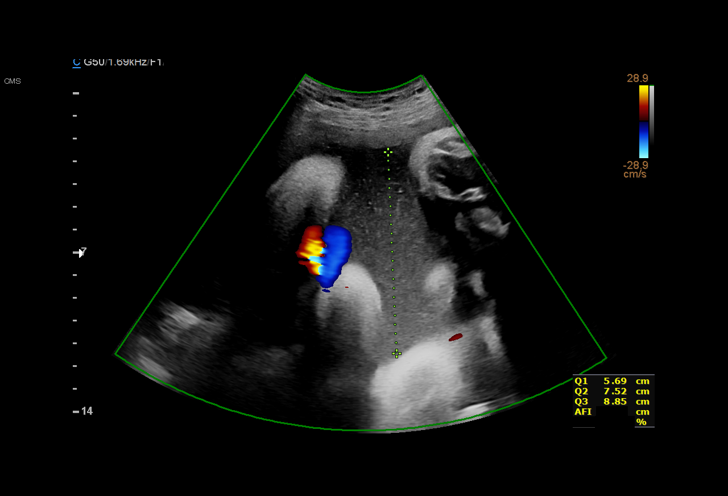
[im 53/56]
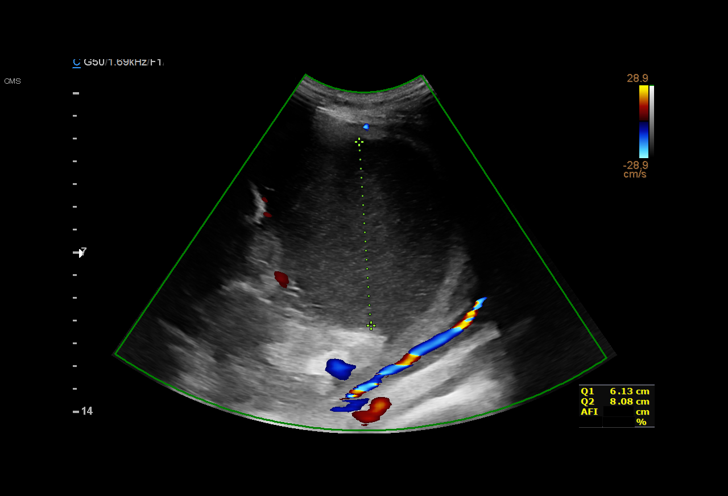

[13 of 28 positions shown; findings below may reference images not displayed]

----------------------------------------------------------------------

 ----------------------------------------------------------------------
Indications

  Encounter for other antenatal screening
  follow-up
  Postdate pregnancy (40-42 weeks)
  Uterine size-date discrepancy, third trimester
  Short interval between pregancies, 3rd
  trimester
  Poor obstetric history: Previous
  preeclampsia -postpartum
  40 weeks gestation of pregnancy
 ----------------------------------------------------------------------
Fetal Evaluation

 Num Of Fetuses:         1
 Fetal Heart Rate(bpm):  153
 Cardiac Activity:       Observed
 Presentation:           Cephalic
 Placenta:               Posterior
 P. Cord Insertion:      Previously Visualized

 Amniotic Fluid
 AFI FV:      Mild Polyhydramnios

 AFI Sum(cm)     %Tile       Largest Pocket(cm)
 26.41           > 97

 RUQ(cm)       RLQ(cm)       LUQ(cm)        LLQ(cm)

Biophysical Evaluation

 Amniotic F.V:   Pocket => 2 cm             F. Tone:        Not Observed
 F. Movement:    Not Observed               Score:          [DATE]
 F. Breathing:   Not Observed
Biometry

 BPD:      93.2  mm     G. Age:  37w 6d         35  %    CI:        73.06   %    70 - 86
                                                         FL/HC:      20.6   %    20.7 -
 HC:      346.6  mm     G. Age:  40w 1d         50  %    HC/AC:      0.93        0.87 -
 AC:      374.3  mm     G. Age:  41w 3d         95  %    FL/BPD:     76.5   %    71 - 87
 FL:       71.3  mm     G. Age:  36w 4d          2  %    FL/AC:      19.0   %    20 - 24
 HUM:      64.7  mm     G. Age:  37w 4d

 Est. FW:    0121  gm    8 lb 10 oz      71  %
OB History

 Gravidity:    2         Term:   1
 Living:       1
Gestational Age

 LMP:           40w 1d        Date:  06/05/18                 EDD:   03/12/19
 U/S Today:     39w 0d                                        EDD:   03/20/19
 Best:          40w 1d     Det. By:  LMP  (06/05/18)          EDD:   03/12/19
Anatomy

 Cranium:               Appears normal         LVOT:                   Appears normal
 Cavum:                 Previously seen        Aortic Arch:            Previously seen
 Ventricles:            Appears normal         Ductal Arch:            Previously seen
 Choroid Plexus:        Appears normal         Diaphragm:              Previously seen
 Cerebellum:            Previously seen        Stomach:                Appears normal, left
                                                                       sided
 Posterior Fossa:       Previously seen        Abdomen:                Appears normal
 Nuchal Fold:           Previously seen        Abdominal Wall:         Previously seen
 Face:                  Orbits and profile     Cord Vessels:           Previously seen
                        previously seen
 Lips:                  Appears normal         Kidneys:                Appear normal
 Palate:                Previously seen        Bladder:                Appears normal
 Thoracic:              Previously seen        Spine:                  Previously seen
 Heart:                 Previously seen        Upper Extremities:      Previously seen
 RVOT:                  Appears normal         Lower Extremities:      Previously seen

 Other:  Heels, 5th digit and nasal bone previously visualized.
Cervix Uterus Adnexa

 Cervix
 Not visualized (advanced GA >53wks)

 Uterus
 No abnormality visualized.
Impression

 Patient is here for fetal growth assessment and antenatal
 testing.

 Fetal growth is appropriate for gestational age. Amniotic fluid
 is slightly increased (AFI=26 cm) and good fetal activity is
 seen. Fetal breathing movements, tone and gross body
 movements did not meet the criteria. BPP [DATE].

 I explained the significance and limitations of BPP. I
 recommended delivery.

 Discussed ultrasound findings with Dr. Sypsena (ob-
 attending). Patient will go over to L&D today for induction of
 labor. She had 1 previous term vaginal delivery.
                 Jumper, Lorenz

## 2020-01-26 ENCOUNTER — Other Ambulatory Visit: Payer: Self-pay | Admitting: *Deleted

## 2020-01-26 MED ORDER — MEDROXYPROGESTERONE ACETATE 150 MG/ML IM SUSP: 150.0000 mg | INTRAMUSCULAR | 3 refills | Status: DC

## 2020-01-26 MED ORDER — MEDROXYPROGESTERONE ACETATE 150 MG/ML IM SUSP: 150.0000 mg | INTRAMUSCULAR | 2 refills | Status: DC

## 2020-01-27 ENCOUNTER — Ambulatory Visit (INDEPENDENT_AMBULATORY_CARE_PROVIDER_SITE_OTHER): Payer: Medicaid Other | Admitting: *Deleted

## 2020-01-27 ENCOUNTER — Other Ambulatory Visit: Payer: Self-pay

## 2020-01-27 VITALS — BP 117/75 | HR 94

## 2020-01-27 DIAGNOSIS — Z3042 Encounter for surveillance of injectable contraceptive: Secondary | ICD-10-CM | POA: Diagnosis not present

## 2020-01-27 MED ORDER — MEDROXYPROGESTERONE ACETATE 150 MG/ML IM SUSP
150.0000 mg | Freq: Once | INTRAMUSCULAR | Status: AC
  Administered 2020-01-27: 150 mg via INTRAMUSCULAR

## 2020-01-27 NOTE — Progress Notes (Signed)
Date last pap: 09/04/2018 Last Depo-Provera: 11/13/2019. Side Effects if any: none. Serum HCG indicated? NA. Depo-Provera 150 mg IM given by: Scheryl Marten, RN. Next appointment due Oct 12-26.

## 2020-01-28 NOTE — Progress Notes (Signed)
Patient was assessed and managed by nursing staff during this encounter. I have reviewed the chart and agree with the documentation and plan. I have also made any necessary editorial changes.  Jaynie Collins, MD 01/28/2020 8:09 AM

## 2020-06-02 ENCOUNTER — Other Ambulatory Visit: Payer: Self-pay

## 2020-06-02 ENCOUNTER — Ambulatory Visit (INDEPENDENT_AMBULATORY_CARE_PROVIDER_SITE_OTHER): Payer: Medicaid Other | Admitting: *Deleted

## 2020-06-02 VITALS — BP 132/79 | HR 84

## 2020-06-02 DIAGNOSIS — R350 Frequency of micturition: Secondary | ICD-10-CM

## 2020-06-02 LAB — POCT URINALYSIS DIPSTICK

## 2020-06-02 MED ORDER — NITROFURANTOIN MONOHYD MACRO 100 MG PO CAPS: 100.0000 mg | ORAL_CAPSULE | Freq: Two times a day (BID) | ORAL | 0 refills | Status: DC

## 2020-06-02 MED ORDER — PHENAZOPYRIDINE HCL 200 MG PO TABS: 200.0000 mg | ORAL_TABLET | Freq: Three times a day (TID) | ORAL | 1 refills | Status: DC | PRN

## 2020-06-02 NOTE — Progress Notes (Signed)
SUBJECTIVE: Nicole Mcmahon is a 24 y.o. female who complains of urinary frequency, urgency and dysuria x a few days, without flank pain, fever, chills, or abnormal vaginal discharge or bleeding.   OBJECTIVE: Appears well, in no apparent distress.  Vital signs are normal. Urine dipstick shows positive for RBC's and positive for leukocytes.    ASSESSMENT: Dysuria  PLAN: Treatment per orders.  Call or return to clinic prn if these symptoms worsen or fail to improve as anticipated.

## 2020-06-02 NOTE — Progress Notes (Signed)
Attestation of Attending Supervision of clinical support staff: I agree with the care provided to this patient and was available for any consultation.  I have reviewed the RN's note and chart. I was available for consult and to see the patient if needed.   Delsa Walder Niles Deshea Pooley, MD, MPH, ABFM Attending Physician [facility]  

## 2020-06-05 LAB — URINE CULTURE

## 2020-06-08 ENCOUNTER — Telehealth: Payer: Self-pay

## 2020-06-08 NOTE — Telephone Encounter (Signed)
Pt called and informed of results. Pt advised to continue taking antibiotic that was given on 12/1 as directed. Pt verbalized understanding.

## 2020-06-08 NOTE — Telephone Encounter (Signed)
-----   Message from Federico Flake, MD sent at 06/07/2020 11:41 AM EST ----- Urine culture positive. Patient was was given macrobid 12/1

## 2020-07-06 ENCOUNTER — Ambulatory Visit: Payer: Medicaid Other | Admitting: Family Medicine

## 2020-07-06 ENCOUNTER — Encounter: Payer: Self-pay | Admitting: Family Medicine

## 2020-07-06 NOTE — Progress Notes (Signed)
Patient did not keep appointment today. She may call to reschedule.  

## 2020-11-02 ENCOUNTER — Other Ambulatory Visit: Payer: Self-pay

## 2020-11-02 ENCOUNTER — Ambulatory Visit (INDEPENDENT_AMBULATORY_CARE_PROVIDER_SITE_OTHER): Payer: Medicaid Other | Admitting: *Deleted

## 2020-11-02 VITALS — BP 113/76 | HR 80

## 2020-11-02 DIAGNOSIS — R300 Dysuria: Secondary | ICD-10-CM | POA: Diagnosis not present

## 2020-11-02 LAB — POCT URINALYSIS DIPSTICK

## 2020-11-02 MED ORDER — NITROFURANTOIN MONOHYD MACRO 100 MG PO CAPS: 100.0000 mg | ORAL_CAPSULE | Freq: Two times a day (BID) | ORAL | 0 refills | Status: DC

## 2020-11-02 NOTE — Progress Notes (Signed)
Patient was assessed and managed by nursing staff during this encounter. I have reviewed the chart and agree with the documentation and plan. I have also made any necessary editorial changes.  Catalina Antigua, MD 11/02/2020 2:34 PM

## 2020-11-02 NOTE — Progress Notes (Signed)
SUBJECTIVE: Nicole Mcmahon is a 25 y.o. female who complains of urinary  dysuria x 1 day, without flank pain, fever, chills, or abnormal vaginal discharge or bleeding. Pt states this is how her last UTI started.  OBJECTIVE: Appears well, in no apparent distress.  Vital signs are normal. Urine dipstick shows positive for RBC's and positive for leukocytes.    ASSESSMENT: Dysuria  PLAN:Will send for culture. Treatment per orders.  Call or return to clinic prn if these symptoms worsen or fail to improve as anticipated.

## 2020-11-04 LAB — URINE CULTURE

## 2020-12-10 ENCOUNTER — Ambulatory Visit: Payer: Medicaid Other

## 2020-12-27 ENCOUNTER — Encounter: Payer: Self-pay | Admitting: Radiology

## 2020-12-27 ENCOUNTER — Other Ambulatory Visit: Payer: Self-pay

## 2020-12-27 ENCOUNTER — Ambulatory Visit (INDEPENDENT_AMBULATORY_CARE_PROVIDER_SITE_OTHER): Payer: Medicaid Other | Admitting: *Deleted

## 2020-12-27 DIAGNOSIS — Z3201 Encounter for pregnancy test, result positive: Secondary | ICD-10-CM

## 2020-12-27 LAB — POCT URINE PREGNANCY: Preg Test, Ur: POSITIVE — AB

## 2020-12-27 NOTE — Progress Notes (Signed)
Nicole Mcmahon here for a UPT. Pt had a positive upt at home. LMP is 11/07/20.     UPT in office Positive. Pt currently 7 weeks 1 day per LMP   Reviewed medications and informed to start a PNV, if not already. Pt to follow up in 3 weeks for New OB visit.

## 2020-12-29 NOTE — Progress Notes (Signed)
Patient was assessed and managed by nursing staff during this encounter. I have reviewed the chart and agree with the documentation and plan. I have also made any necessary editorial changes.  Bude Bing, MD 12/29/2020 10:01 AM

## 2021-01-18 ENCOUNTER — Other Ambulatory Visit (HOSPITAL_COMMUNITY)
Admission: RE | Admit: 2021-01-18 | Discharge: 2021-01-18 | Disposition: A | Discharge status: Home / Self Care | Payer: Medicaid Other | Source: Ambulatory Visit | Attending: Obstetrics and Gynecology | Admitting: Obstetrics and Gynecology

## 2021-01-18 ENCOUNTER — Encounter: Payer: Self-pay | Admitting: Obstetrics and Gynecology

## 2021-01-18 ENCOUNTER — Ambulatory Visit (INDEPENDENT_AMBULATORY_CARE_PROVIDER_SITE_OTHER): Payer: Medicaid Other | Admitting: Obstetrics and Gynecology

## 2021-01-18 ENCOUNTER — Ambulatory Visit (INDEPENDENT_AMBULATORY_CARE_PROVIDER_SITE_OTHER): Payer: Medicaid Other

## 2021-01-18 ENCOUNTER — Other Ambulatory Visit: Payer: Self-pay

## 2021-01-18 VITALS — BP 128/83 | HR 90 | Wt 162.8 lb

## 2021-01-18 DIAGNOSIS — Z3201 Encounter for pregnancy test, result positive: Secondary | ICD-10-CM

## 2021-01-18 DIAGNOSIS — Z3A10 10 weeks gestation of pregnancy: Secondary | ICD-10-CM | POA: Diagnosis not present

## 2021-01-18 DIAGNOSIS — Z3481 Encounter for supervision of other normal pregnancy, first trimester: Secondary | ICD-10-CM | POA: Diagnosis not present

## 2021-01-18 DIAGNOSIS — Z3480 Encounter for supervision of other normal pregnancy, unspecified trimester: Secondary | ICD-10-CM | POA: Insufficient documentation

## 2021-01-18 MED ORDER — ASPIRIN EC 81 MG PO TBEC: 81.0000 mg | DELAYED_RELEASE_TABLET | Freq: Every day | ORAL | 2 refills | Status: DC

## 2021-01-18 NOTE — Progress Notes (Signed)
  Subjective:    Nicole Mcmahon is a [id number] [redacted]w[redacted]d being seen today for her first obstetrical visit.  Her obstetrical history is significant for 2 previous full term pregnancies. History of gestational hypertension and postpartum preeclampsia with her first pregnancy. Patient does intend to breast feed. Pregnancy history fully reviewed.  Patient reports no complaints.  Vitals:   01/18/21 1401  BP: 128/83  Pulse: 90  Weight: 162 lb 12.8 oz (73.8 kg)    HISTORY: OB History  Gravida Para Term Preterm AB Living  3 2 2     2   SAB IAB Ectopic Multiple Live Births        0 2    # Outcome Date GA Lbr Len/2nd Weight Sex Delivery Anes PTL Lv  3 Current           2 Term 03/14/19 [redacted]w[redacted]d 11:37 / 01:47 8 lb 2.2 oz (3.691 kg) M Vag-Spont EPI  LIV  1 Term 05/04/17 [redacted]w[redacted]d 23:05 / 00:32 6 lb 3 oz (2.807 kg) M Vag-Spont EPI  LIV   Past Medical History:  Diagnosis Date  . Asthma   . Blood dyscrasia   . H/O genital injury    Childhood injury where she jumped and landed on a stick that went into her vagina.   . Preeclampsia in postpartum period 05/10/2017  . Pregnancy induced hypertension   . Sickle cell trait [facility])    Past Surgical History:  Procedure Laterality Date  . NO PAST SURGERIES     Family History  Problem Relation Age of Onset  . Cancer Paternal Grandmother   . Lupus Paternal Grandmother   . Cancer Paternal Aunt      Exam    Uterus:     Pelvic Exam:    Perineum: No Hemorrhoids, Normal Perineum   Vulva: normal   Vagina:  normal mucosa, normal discharge   pH:    Cervix: multiparous appearance and closed and long   Adnexa: normal adnexa   Bony Pelvis: gynecoid  System: Breast:  normal appearance, no masses or tenderness   Skin: normal coloration and turgor, no rashes    Neurologic: oriented, no focal deficits   Extremities: normal strength, tone, and muscle mass   HEENT extra ocular movement intact   Mouth/Teeth mucous membranes moist, pharynx normal without lesions  and dental hygiene good   Neck supple and no masses   Cardiovascular: regular rate and rhythm   Respiratory:  appears well, vitals normal, no respiratory distress, acyanotic, normal RR, chest clear, no wheezing, crepitations, rhonchi, normal symmetric air entry   Abdomen: soft, non-tender; bowel sounds normal; no masses,  no organomegaly   Urinary:       Assessment:    Pregnancy: [facility] Patient Active Problem List   Diagnosis Date Noted  . Supervision of other normal pregnancy, antepartum 01/18/2021  . 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 History of pospartum preeclampsia after antepartum GHTN in prior pregnancy, currently pregnant 05/10/2017        Plan:     Initial labs drawn. Prenatal vitamins. Problem list reviewed and updated. Genetic Screening discussed : Panorama ordered.  Ultrasound discussed; fetal survey: ordered. Pap smear with cultures collected Rx ASA provided with instructions to start in 2 weeks  Follow up in 4 weeks. 50% of 30 min visit spent on counseling and coordination of care.     Nicole Mcmahon 01/18/2021

## 2021-01-18 NOTE — Patient Instructions (Signed)
Obstetrics: Normal and Problem Pregnancies (7th ed., pp. 102-121). [place], [place]: Elsevier."> Textbook of Family Medicine (9th ed., pp. 365-410). [place], [place]: [employer].">  First Trimester of Pregnancy  The first trimester of pregnancy starts on the first day of your last menstrual period until the end of week 12. This is months 1 through 3 of pregnancy. A week after a sperm fertilizes an egg, the egg will implant into the wall of the uterus and begin to develop into a baby. By the end of 12 weeks, all the baby'sorgans will be formed and the baby will be 2-3 inches in size. Body changes during your first trimester Your body goes through many changes during pregnancy. The changes vary andgenerally return to normal after your baby is born. Physical changes You may gain or lose weight. Your breasts may begin to grow larger and become tender. The tissue that surrounds your nipples (areola) may become darker. Dark spots or blotches (chloasma or mask of pregnancy) may develop on your face. You may have changes in your hair. These can include thickening or thinning of your hair or changes in texture. Health changes You may feel nauseous, and you may vomit. You may have heartburn. You may develop headaches. You may develop constipation. Your gums may bleed and may be sensitive to brushing and flossing. Other changes You may tire easily. You may urinate more often. Your menstrual periods will stop. You may have a loss of appetite. You may develop cravings for certain kinds of food. You may have changes in your emotions from day to day. You may have more vivid and strange dreams. Follow these instructions at home: Medicines Follow your health care provider's instructions regarding medicine use. Specific medicines may be either safe or unsafe to take during pregnancy. Do not take any medicines unless told to by your health care provider. Take a prenatal vitamin that contains at least  600 micrograms (mcg) of folic acid. Eating and drinking Eat a healthy diet that includes fresh fruits and vegetables, whole grains, good sources of protein such as meat, eggs, or tofu, and low-fat dairy products. Avoid raw meat and unpasteurized juice, milk, and cheese. These carry germs that can harm you and your baby. If you feel nauseous or you vomit: Eat 4 or 5 small meals a day instead of 3 large meals. Try eating a few soda crackers. Drink liquids between meals instead of during meals. You may need to take these actions to prevent or treat constipation: Drink enough fluid to keep your urine pale yellow. Eat foods that are high in fiber, such as beans, whole grains, and fresh fruits and vegetables. Limit foods that are high in fat and processed sugars, such as fried or sweet foods. Activity Exercise only as directed by your health care provider. Most people can continue their usual exercise routine during pregnancy. Try to exercise for 30 minutes at least 5 days a week. Stop exercising if you develop pain or cramping in the lower abdomen or lower back. Avoid exercising if it is very hot or humid or if you are at high altitude. Avoid heavy lifting. If you choose to, you may have sex unless your health care provider tells you not to. Relieving pain and discomfort Wear a good support bra to relieve breast tenderness. Rest with your legs elevated if you have leg cramps or low back pain. If you develop bulging veins (varicose veins) in your legs: Wear support hose as told by your health care provider. Elevate   your feet for 15 minutes, 3-4 times a day. Limit salt in your diet. Safety Wear your seat belt at all times when driving or riding in a car. Talk with your health care provider if someone is verbally or physically abusive to you. Talk with your health care provider if you are feeling sad or have thoughts of hurting yourself. Lifestyle Do not use hot tubs, steam rooms, or  saunas. Do not douche. Do not use tampons or scented sanitary pads. Do not use herbal remedies, alcohol, illegal drugs, or medicines that are not approved by your health care provider. Chemicals in these products can harm your baby. Do not use any products that contain nicotine or tobacco, such as cigarettes, e-cigarettes, and chewing tobacco. If you need help quitting, ask your health care provider. Avoid cat litter boxes and soil used by cats. These carry germs that can cause birth defects in the baby and possibly loss of the unborn baby (fetus) by miscarriage or stillbirth. General instructions During routine prenatal visits in the first trimester, your health care provider will do a physical exam, perform necessary tests, and ask you how things are going. Keep all follow-up visits. This is important. Ask for help if you have counseling or nutritional needs during pregnancy. Your health care provider can offer advice or refer you to specialists for help with various needs. Schedule a dentist appointment. At home, brush your teeth with a soft toothbrush. Floss gently. Write down your questions. Take them to your prenatal visits. Where to find more information American Pregnancy Association: americanpregnancy.org [employer]: https://www.todd-brady.net/ Office on [employer] Health: [employer] Contact a health care provider if you have: Dizziness. A fever. Mild pelvic cramps, pelvic pressure, or nagging pain in the abdominal area. Nausea, vomiting, or diarrhea that lasts for 24 hours or longer. A bad-smelling vaginal discharge. Pain when you urinate. Known exposure to a contagious illness, such as chickenpox, measles, Zika virus, HIV, or hepatitis. Get help right away if you have: Spotting or bleeding from your vagina. Severe abdominal cramping or pain. Shortness of breath or chest pain. Any kind of trauma, such as from a fall  or a car crash. New or increased pain, swelling, or redness in an arm or leg. Summary The first trimester of pregnancy starts on the first day of your last menstrual period until the end of week 12 (months 1 through 3). Eating 4 or 5 small meals a day rather than 3 large meals may help to relieve nausea and vomiting. Do not use any products that contain nicotine or tobacco, such as cigarettes, e-cigarettes, and chewing tobacco. If you need help quitting, ask your health care provider. Keep all follow-up visits. This is important. This information is not intended to replace advice given to you by your health care provider. Make sure you discuss any questions you have with your healthcare provider. Document Revised: 11/26/2019 Document Reviewed: 10/02/2019 Elsevier Patient Education  2022 [employer]  Preeclampsia and Eclampsia Preeclampsia is a serious condition that may develop during pregnancy. This condition involves high blood pressure during pregnancy and causes symptoms such as headaches, vision changes, and increased swelling in the legs, hands, and face. Preeclampsia occurs after 20 weeks of pregnancy. Eclampsia is a seizure that happens from worsening preeclampsia. Diagnosing and managing preeclampsia early is important. If not treated early, it can cause seriousproblems for mother and baby. There is no cure for this condition. However, during pregnancy, delivering the baby may be the best treatment  for preeclampsia or eclampsia. For most women, symptoms of preeclampsia and eclampsia go away after giving birth. In rare cases, a woman may develop preeclampsia or eclampsia after giving birth. This usually occurs within 48 hours after childbirth but may occur up to 6 weeksafter giving birth. What are the causes? The cause of this condition is not known. What increases the risk? The following factors make you more likely to develop preeclampsia: Being pregnant for the first time or being  pregnant with multiples. Having had preeclampsia or a condition called hemolysis, elevated liver enzymes, and low platelet count (HELLP)syndrome during a past pregnancy. Having a family history of preeclampsia. Being older than age 25. Being obese. Becoming pregnant through fertility treatments. Conditions that reduce blood flow or oxygen to your placenta and baby may also increase your risk. These include: High blood pressure before, during, or immediately following pregnancy. Kidney disease. Diabetes. Blood clotting disorders. Autoimmune diseases, such as lupus. Sleep apnea. What are the signs or symptoms? Common symptoms of this condition include: A severe, throbbing headache that does not go away. Vision problems, such as blurred or double vision and light sensitivity. Pain in the stomach, especially the right upper region. Pain in the shoulder. Other symptoms that may develop as the condition gets worse include: Sudden weight gain because of fluid buildup in the body. This causes swelling of the face, hands, legs, and feet. Severe nausea and vomiting. Urinating less than usual. Shortness of breath. Seizures. How is this diagnosed? Your health care provider will ask you about symptoms and check for signs of preeclampsia during your prenatal visits. You will also have routine tests, including: Checking your blood pressure. Urine tests to check for protein. Blood tests to assess your organ function. Monitoring your baby's heart rate. Ultrasounds to check fetal growth. How is this treated? You and your health care provider will determine the treatment that is best for you. Treatment may include: Frequent prenatal visits to check for preeclampsia. Medicine to lower your blood pressure. Medicine to prevent seizures. Low-dose aspirin during your pregnancy. Staying in the hospital, in severe cases. You will be given medicines to control your blood pressure and the amount of fluids  in your body. Delivering your baby. Work with your health care provider to manage any chronic health conditions, such as diabetes or kidney problems. Also, work with your health care providerto manage weight gain during pregnancy. Follow these instructions at home: Eating and drinking Drink enough fluid to keep your urine pale yellow. Avoid caffeine. Caffeine may increase blood pressure and heart rate and lead to dehydration. Reduce the amount of salt that you eat. Lifestyle Do not use any products that contain nicotine or tobacco. These products include cigarettes, chewing tobacco, and vaping devices, such as e-cigarettes. If you need help quitting, ask your health care provider. Do not use alcohol or drugs. Avoid stress as much as possible. Rest and get plenty of sleep. General instructions  Take over-the-counter and prescription medicines only as told by your health care provider. When lying down, lie on your left side. This keeps pressure off your major blood vessels. When sitting or lying down, raise (elevate) your feet. Try putting pillows underneath your lower legs. Exercise regularly. Ask your health care provider what kinds of exercise are best for you. Check your blood pressure as often as recommended by your health care provider. Keep all prenatal and follow-up visits. This is important.  Contact a health care provider if: You have symptoms that may need  treatment or closer monitoring. These include: Headaches. Stomach pain or nausea and vomiting. Shoulder pain. Vision problems, such as spots in front of your eyes or blurry vision. Sudden weight gain or increased swelling in your face, hands, legs, and feet. Increased anxiety or feeling of impending doom. Signs or symptoms of labor. Get help right away if: You have any of the following symptoms: A seizure. Shortness of breath or trouble breathing. Trouble speaking or slurred speech. Fainting. Chest pain. These  symptoms may represent a serious problem that is an emergency. Do not wait to see if the symptoms will go away. Get medical help right away. Call your local emergency services (911 in the [place] Do not drive yourself to the hospital. Summary Preeclampsia is a serious condition that may develop during pregnancy. Diagnosing and treating preeclampsia early is very important. Keep all prenatal and follow-up visits. This is important. Get help right away if you have a seizure, shortness of breath or trouble breathing, trouble speaking or slurred speech, chest pain, or fainting. This information is not intended to replace advice given to you by your health care provider. Make sure you discuss any questions you have with your healthcare provider. Document Revised: 03/11/2020 Document Reviewed: 03/11/2020 Elsevier Patient Education  2022 [employer].  Second Trimester of Pregnancy  The second trimester of pregnancy is from week 13 through week 27. This is months 4 through 6 of pregnancy. The second trimester is often a time when you feel your best. Your body has adjusted to being pregnant, and you begin to feelbetter physically. During the second trimester: Morning sickness has lessened or stopped completely. You may have more energy. You may have an increase in appetite. The second trimester is also a time when the unborn baby (fetus) is growing rapidly. At the end of the sixth month, the fetus may be up to 12 inches long and weigh about 1 pounds. You will likely begin to feel the baby move (quickening) between 16 and 20 weeks of pregnancy. Body changes during your second trimester Your body continues to go through many changes during your second trimester.The changes vary and generally return to normal after the baby is born. Physical changes Your weight will continue to increase. You will notice your lower abdomen bulging out. You may begin to get stretch marks on your hips, abdomen, and  breasts. Your breasts will continue to grow and to become tender. Dark spots or blotches (chloasma or mask of pregnancy) may develop on your face. A dark line from your belly button to the pubic area (linea nigra) may appear. You may have changes in your hair. These can include thickening of your hair, rapid growth, and changes in texture. Some people also have hair loss during or after pregnancy, or hair that feels dry or thin. Health changes You may develop headaches. You may have heartburn. You may develop constipation. You may develop hemorrhoids or swollen, bulging veins (varicose veins). Your gums may bleed and may be sensitive to brushing and flossing. You may urinate more often because the fetus is pressing on your bladder. You may have back pain. This is caused by: Weight gain. Pregnancy hormones that are relaxing the joints in your pelvis. A shift in weight and the muscles that support your balance. Follow these instructions at home: Medicines Follow your health care provider's instructions regarding medicine use. Specific medicines may be either safe or unsafe to take during pregnancy. Do not take any medicines unless approved by your health care  provider. Take a prenatal vitamin that contains at least 600 micrograms (mcg) of folic acid. Eating and drinking Eat a healthy diet that includes fresh fruits and vegetables, whole grains, good sources of protein such as meat, eggs, or tofu, and low-fat dairy products. Avoid raw meat and unpasteurized juice, milk, and cheese. These carry germs that can harm you and your baby. You may need to take these actions to prevent or treat constipation: Drink enough fluid to keep your urine pale yellow. Eat foods that are high in fiber, such as beans, whole grains, and fresh fruits and vegetables. Limit foods that are high in fat and processed sugars, such as fried or sweet foods. Activity Exercise only as directed by your health care provider.  Most people can continue their usual exercise routine during pregnancy. Try to exercise for 30 minutes at least 5 days a week. Stop exercising if you develop contractions in your uterus. Stop exercising if you develop pain or cramping in the lower abdomen or lower back. Avoid exercising if it is very hot or humid or if you are at a high altitude. Avoid heavy lifting. If you choose to, you may have sex unless your health care provider tells you not to. Relieving pain and discomfort Wear a supportive bra to prevent discomfort from breast tenderness. Take warm sitz baths to soothe any pain or discomfort caused by hemorrhoids. Use hemorrhoid cream if your health care provider approves. Rest with your legs raised (elevated) if you have leg cramps or low back pain. If you develop varicose veins: Wear support hose as told by your health care provider. Elevate your feet for 15 minutes, 3-4 times a day. Limit salt in your diet. Safety Wear your seat belt at all times when driving or riding in a car. Talk with your health care provider if someone is verbally or physically abusive to you. Lifestyle Do not use hot tubs, steam rooms, or saunas. Do not douche. Do not use tampons or scented sanitary pads. Avoid cat litter boxes and soil used by cats. These carry germs that can cause birth defects in the baby and possibly loss of the fetus by miscarriage or stillbirth. Do not use herbal remedies, alcohol, illegal drugs, or medicines that are not approved by your health care provider. Chemicals in these products can harm your baby. Do not use any products that contain nicotine or tobacco, such as cigarettes, e-cigarettes, and chewing tobacco. If you need help quitting, ask your health care provider. General instructions During a routine prenatal visit, your health care provider will do a physical exam and other tests. He or she will also discuss your overall health. Keep all follow-up visits. This is  important. Ask your health care provider for a referral to a local prenatal education class. Ask for help if you have counseling or nutritional needs during pregnancy. Your health care provider can offer advice or refer you to specialists for help with various needs. Where to find more information American Pregnancy Association: americanpregnancy.org [employer]: https://www.todd-brady.net/ Office on [employer] Health: [employer] Contact a health care provider if you have: A headache that does not go away when you take medicine. Vision changes or you see spots in front of your eyes. Mild pelvic cramps, pelvic pressure, or nagging pain in the abdominal area. Persistent nausea, vomiting, or diarrhea. A bad-smelling vaginal discharge or foul-smelling urine. Pain when you urinate. Sudden or extreme swelling of your face, hands, ankles, feet, or legs. A fever. Get  help right away if you: Have fluid leaking from your vagina. Have spotting or bleeding from your vagina. Have severe abdominal cramping or pain. Have difficulty breathing. Have chest pain. Have fainting spells. Have not felt your baby move for the time period told by your health care provider. Have new or increased pain, swelling, or redness in an arm or leg. Summary The second trimester of pregnancy is from week 13 through week 27 (months 4 through 6). Do not use herbal remedies, alcohol, illegal drugs, or medicines that are not approved by your health care provider. Chemicals in these products can harm your baby. Exercise only as directed by your health care provider. Most people can continue their usual exercise routine during pregnancy. Keep all follow-up visits. This is important. This information is not intended to replace advice given to you by your health care provider. Make sure you discuss any questions you have with your healthcare provider. Document  Revised: 11/26/2019 Document Reviewed: 10/02/2019 Elsevier Patient Education  2022 [employer].  Contraception Choices Contraception, also called birth control, refers to methods or devices thatprevent pregnancy. Hormonal methods  Contraceptive implant A contraceptive implant is a thin, plastic tube that contains a hormone that prevents pregnancy. It is different from an intrauterine device (IUD). It is inserted into the upper part of the arm by a health care provider. Implants canbe effective for up to 3 years. Progestin-only injections Progestin-only injections are injections of progestin, a synthetic form of thehormone progesterone. They are given every 3 months by a health care provider. Birth control pills Birth control pills are pills that contain hormones that prevent pregnancy. They must be taken once a day, preferably at the same time each day. Aprescription is needed to use this method of contraception. Birth control patch The birth control patch contains hormones that prevent pregnancy. It is placed on the skin and must be changed once a week for three weeks and removed on thefourth week. A prescription is needed to use this method of contraception. Vaginal ring A vaginal ring contains hormones that prevent pregnancy. It is placed in the vagina for three weeks and removed on the fourth week. After that, the process is repeated with a new ring. A prescription is needed to use this method ofcontraception. Emergency contraceptive Emergency contraceptives prevent pregnancy after unprotected sex. They come in pill form and can be taken up to 5 days after sex. They work best the sooner they are taken after having sex. Most emergency contraceptives are available without a prescription. This method should not be used as your only form ofbirth control. Barrier methods  Female condom A female condom is a thin sheath that is worn over the penis during sex. Condoms keep sperm from going inside a  woman's body. They can be used with a sperm-killing substance (spermicide) to increase their effectiveness. They should be thrown away after one use. Female condom A female condom is a soft, loose-fitting sheath that is put into the vagina before sex. The condom keeps sperm from going inside a woman's body. Theyshould be thrown away after one use. Diaphragm A diaphragm is a soft, dome-shaped barrier. It is inserted into the vagina before sex, along with a spermicide. The diaphragm blocks sperm from entering the uterus, and the spermicide kills sperm. A diaphragm should be left in thevagina for 6-8 hours after sex and removed within 24 hours. A diaphragm is prescribed and fitted by a health care provider. A diaphragm should be replaced every 1-2 years, after  giving birth, after gaining more than15 lb (6.8 kg), and after pelvic surgery. Cervical cap A cervical cap is a round, soft latex or plastic cup that fits over the cervix. It is inserted into the vagina before sex, along with spermicide. It blocks sperm from entering the uterus. The cap should be left in place for 6-8 hours after sex and removed within 48 hours. A cervical cap must be prescribed andfitted by a health care provider. It should be replaced every 2 years. Sponge A sponge is a soft, circular piece of polyurethane foam with spermicide in it. The sponge helps block sperm from entering the uterus, and the spermicide kills sperm. To use it, you make it wet and then insert it into the vagina. It should be inserted before sex, left in for at least 6 hours after sex, and removed andthrown away within 30 hours. Spermicides Spermicides are chemicals that kill or block sperm from entering the cervix and uterus. They can come as a cream, jelly, suppository, foam, or tablet. A spermicide should be inserted into the vagina with an applicator at least 10-15 minutes before sex to allow time for it to work. The process must be repeatedevery time you have  sex. Spermicides do not require a prescription. Intrauterine contraception Intrauterine device (IUD) An IUD is a T-shaped device that is put in a woman's uterus. There are two types: Hormone IUD.This type contains progestin, a synthetic form of the hormone progesterone. This type can stay in place for 3-5 years. Copper IUD.This type is wrapped in copper wire. It can stay in place for 10 years. Permanent methods of contraception Female tubal ligation In this method, a woman's fallopian tubes are sealed, tied, or blocked duringsurgery to prevent eggs from traveling to the uterus. Hysteroscopic sterilization In this method, a small, flexible insert is placed into each fallopian tube. The inserts cause scar tissue to form in the fallopian tubes and block them, so sperm cannot reach an egg. The procedure takes about 3 months to be effective.Another form of birth control must be used during those 3 months. Female sterilization This is a procedure to tie off the tubes that carry sperm (vasectomy). After the procedure, the man can still ejaculate fluid (semen). Another form of birth control must be used for 3 months after the procedure. Natural planning methods Natural family planning In this method, a couple does not have sex on days when the woman could become pregnant. Calendar method In this method, the woman keeps track of the length of each menstrual cycle, identifies the days when pregnancy can happen, and does not have sex on those days. Ovulation method In this method, a couple avoids sex during ovulation. Symptothermal method This method involves not having sex during ovulation. The woman typically checks for ovulation bywatching changes in her temperature and in the consistency of cervical mucus. Post-ovulation method In this method, a couple waits to have sex until after ovulation. Where to find more information Centers for Disease Control and Prevention:  [employer] Summary Contraception, also called birth control, refers to methods or devices that prevent pregnancy. Hormonal methods of contraception include implants, injections, pills, patches, vaginal rings, and emergency contraceptives. Barrier methods of contraception can include female condoms, female condoms, diaphragms, cervical caps, sponges, and spermicides. There are two types of IUDs (intrauterine devices). An IUD can be put in a woman's uterus to prevent pregnancy for 3-5 years. Permanent sterilization can be done through a procedure for males and females. Natural family planning methods  involve nothaving sex on days when the woman could become pregnant. This information is not intended to replace advice given to you by your health care provider. Make sure you discuss any questions you have with your healthcare provider. Document Revised: 11/24/2019 Document Reviewed: 11/24/2019 Elsevier Patient Education  2022 [employer].

## 2021-01-18 NOTE — Progress Notes (Signed)
Patient informed that the ultrasound is considered a limited obstetric ultrasound and is not intended to be a complete ultrasound exam.  Patient also informed that the ultrasound is not being completed with the intent of assessing for fetal or placental anomalies or any pelvic abnormalities. Explained that the purpose of today's ultrasound is to assess for fetal heart rate.  Patient acknowledges the purpose of the exam and the limitations of the study.         

## 2021-01-19 LAB — CBC/D/PLT+RPR+RH+ABO+RUBIGG...
Antibody Screen: NEGATIVE
Basophils Absolute: 0 10*3/uL (ref 0.0–0.2)
Basos: 0 %
EOS (ABSOLUTE): 0.2 10*3/uL (ref 0.0–0.4)
Eos: 2 %
HCV Ab: 0.1 s/co ratio (ref 0.0–0.9)
HIV Screen 4th Generation wRfx: NONREACTIVE
Hematocrit: 34.1 % (ref 34.0–46.6)
Hemoglobin: 11.2 g/dL (ref 11.1–15.9)
Hepatitis B Surface Ag: NEGATIVE
Immature Grans (Abs): 0 10*3/uL (ref 0.0–0.1)
Immature Granulocytes: 0 %
Lymphocytes Absolute: 3.2 10*3/uL — ABNORMAL HIGH (ref 0.7–3.1)
Lymphs: 25 %
MCH: 30.3 pg (ref 26.6–33.0)
MCHC: 32.8 g/dL (ref 31.5–35.7)
MCV: 92 fL (ref 79–97)
Monocytes Absolute: 0.6 10*3/uL (ref 0.1–0.9)
Monocytes: 5 %
Neutrophils Absolute: 8.6 10*3/uL — ABNORMAL HIGH (ref 1.4–7.0)
Neutrophils: 68 %
Platelets: 361 10*3/uL (ref 150–450)
RBC: 3.7 x10E6/uL — ABNORMAL LOW (ref 3.77–5.28)
RDW: 11.8 % (ref 11.7–15.4)
RPR Ser Ql: NONREACTIVE
Rh Factor: POSITIVE
Rubella Antibodies, IGG: <0.9 index — ABNORMAL LOW (ref >0.99)
WBC: 12.6 10*3/uL — ABNORMAL HIGH (ref 3.4–10.8)

## 2021-01-19 LAB — HCV INTERPRETATION

## 2021-01-20 LAB — URINE CULTURE, OB REFLEX

## 2021-01-20 LAB — CULTURE, OB URINE

## 2021-01-21 LAB — CYTOLOGY - PAP
Chlamydia: NEGATIVE
Comment: NEGATIVE
Comment: NORMAL
Diagnosis: UNDETERMINED — AB
Neisseria Gonorrhea: NEGATIVE

## 2021-01-28 ENCOUNTER — Encounter: Payer: Self-pay | Admitting: Radiology

## 2021-01-28 ENCOUNTER — Telehealth: Payer: Self-pay | Admitting: Radiology

## 2021-01-28 NOTE — Telephone Encounter (Signed)
Left voicemail for patient informing her that Panorama NIPS results have been uploaded into mychart.

## 2021-02-14 ENCOUNTER — Telehealth: Payer: Self-pay | Admitting: Family Medicine

## 2021-02-14 NOTE — Telephone Encounter (Signed)
Spoke to the caller about her babies sex (we dont have yet) and confirmed her appt

## 2021-02-16 ENCOUNTER — Other Ambulatory Visit: Payer: Self-pay

## 2021-02-16 ENCOUNTER — Ambulatory Visit (INDEPENDENT_AMBULATORY_CARE_PROVIDER_SITE_OTHER): Payer: Medicaid Other | Admitting: Advanced Practice Midwife

## 2021-02-16 VITALS — BP 125/78 | HR 93 | Wt 163.0 lb

## 2021-02-16 DIAGNOSIS — Z3A14 14 weeks gestation of pregnancy: Secondary | ICD-10-CM

## 2021-02-16 DIAGNOSIS — Z3480 Encounter for supervision of other normal pregnancy, unspecified trimester: Secondary | ICD-10-CM

## 2021-02-16 NOTE — Progress Notes (Signed)
   PRENATAL VISIT NOTE  Subjective:  Nicole Mcmahon is a 25 y.o. G3P2002 at [redacted]w[redacted]d being seen today for ongoing prenatal care.  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  Patient reports no complaints.  Contractions: Irritability. Vag. Bleeding: None.   . Denies leaking of fluid.   The following portions of the patient's history were reviewed and updated as appropriate: allergies, current medications, past family history, past medical history, past social history, past surgical history and problem list. Problem list updated.  Objective:   Vitals:   02/16/21 0940  BP: 125/78  Pulse: 93  Weight: 163 lb (73.9 kg)    Fetal Status: Fetal Heart Rate (bpm): 149         General:  Alert, oriented and cooperative. Patient is in no acute distress.  Skin: Skin is warm and dry. No rash noted.   Cardiovascular: Normal heart rate noted  Respiratory: Normal respiratory effort, no problems with respiration noted  Abdomen: Soft, gravid, appropriate for gestational age.  Pain/Pressure: Absent     Pelvic: Cervical exam deferred        Extremities: Normal range of motion.  Edema: None  Mental Status: Normal mood and affect. Normal behavior. Normal judgment and thought content.   Assessment and Plan:  Pregnancy: G3P2002 at [redacted]w[redacted]d  1. Supervision of other normal pregnancy, antepartum - LOB, routine care - Anatomy scan previously scheduled, time and date reviewed with patient  2. [redacted] weeks gestation of pregnancy   Preterm labor symptoms and general obstetric precautions including but not limited to vaginal bleeding, contractions, leaking of fluid and fetal movement were reviewed in detail  with the patient. Please refer to After Visit Summary for other counseling recommendations.  Return in about 4 weeks (around 03/16/2021).  Future Appointments  Date Time Provider Department Center  03/16/2021  9:50 AM Calvert Cantor, CNM [facility]  03/21/2021  8:30 AM [facility]    Calvert Cantor, CNM

## 2021-03-10 ENCOUNTER — Encounter: Payer: Self-pay | Admitting: Family Medicine

## 2021-03-10 ENCOUNTER — Ambulatory Visit: Payer: Medicaid Other | Admitting: Family Medicine

## 2021-03-10 ENCOUNTER — Ambulatory Visit: Payer: Medicaid Other

## 2021-03-12 NOTE — Progress Notes (Signed)
Patient did not keep appointment today. She will be called to reschedule.  

## 2021-03-16 ENCOUNTER — Other Ambulatory Visit (HOSPITAL_COMMUNITY)
Admission: RE | Admit: 2021-03-16 | Discharge: 2021-03-16 | Disposition: A | Discharge status: Home / Self Care | Payer: Medicaid Other | Source: Ambulatory Visit | Attending: Advanced Practice Midwife | Admitting: Advanced Practice Midwife

## 2021-03-16 ENCOUNTER — Ambulatory Visit (INDEPENDENT_AMBULATORY_CARE_PROVIDER_SITE_OTHER): Payer: Medicaid Other | Admitting: Advanced Practice Midwife

## 2021-03-16 ENCOUNTER — Other Ambulatory Visit: Payer: Self-pay

## 2021-03-16 VITALS — BP 133/78 | HR 92 | Wt 166.0 lb

## 2021-03-16 DIAGNOSIS — N898 Other specified noninflammatory disorders of vagina: Secondary | ICD-10-CM | POA: Insufficient documentation

## 2021-03-16 DIAGNOSIS — O26899 Other specified pregnancy related conditions, unspecified trimester: Secondary | ICD-10-CM | POA: Insufficient documentation

## 2021-03-16 DIAGNOSIS — Z3480 Encounter for supervision of other normal pregnancy, unspecified trimester: Secondary | ICD-10-CM

## 2021-03-16 DIAGNOSIS — Z3A18 18 weeks gestation of pregnancy: Secondary | ICD-10-CM

## 2021-03-16 MED ORDER — TERCONAZOLE 0.4 % VA CREA: 1.0000 | TOPICAL_CREAM | Freq: Every day | VAGINAL | 0 refills | Status: DC

## 2021-03-16 MED ORDER — NYSTATIN 100000 UNIT/GM EX CREA: 1.0000 "application " | TOPICAL_CREAM | Freq: Two times a day (BID) | CUTANEOUS | 1 refills | Status: DC

## 2021-03-16 NOTE — Progress Notes (Signed)
   PRENATAL VISIT NOTE  Subjective:  Nicole Mcmahon is a 25 y.o. G3P2002 at [redacted]w[redacted]d being seen today for ongoing prenatal care.  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  Patient reports  new onset "lesions" on her labia. She also endorses intermittent vulvar itching .  Contractions: Not present. Vag. Bleeding: None.  Movement: Present. Denies leaking of fluid.   The following portions of the patient's history were reviewed and updated as appropriate: allergies, current medications, past family history, past medical history, past social history, past surgical history and problem list. Problem list updated.  Objective:   Vitals:   03/16/21 1008  BP: 133/78  Pulse: 92  Weight: 166 lb (75.3 kg)    Fetal Status: Fetal Heart Rate (bpm): 147   Movement: Present    General:  Alert, oriented and cooperative. Patient is in no acute distress.  Skin: Skin is warm and dry. No rash noted.   Cardiovascular: Normal heart rate noted  Respiratory: Normal respiratory effort, no problems with respiration noted  Abdomen: Soft, gravid, appropriate for gestational age.  Pain/Pressure: Absent     Pelvic: Cervical exam deferred        Extremities: Normal range of motion.  Edema: None  Mental Status: Normal mood and affect. Normal behavior. Normal judgment and thought content.   Assessment and Plan:  Pregnancy: G3P2002 at [redacted]w[redacted]d  1. Supervision of other normal pregnancy, antepartum - LOB, declined AFP today - Anatomy scan 09/19  2. Vaginal discharge during pregnancy, antepartum - External area of white patchy growth near anus suspicious for fungal growth - Thick white clumped discharge at  introitus. Speculum exam deferred. Will treat presumptively for yeast - Patient's first experience with yeast. Reassured this is not a disorder or STD, normal component of ob/gyn care - Cervicovaginal ancillary only( [facility])  3. [redacted] weeks gestation of pregnancy   Preterm labor symptoms and general obstetric precautions including but not limited to vaginal bleeding, contractions, leaking of fluid and fetal movement were reviewed in detail with the patient. Please refer to After Visit Summary for other counseling recommendations.  Return in about 4 weeks (around 04/13/2021).  Future Appointments  Date Time Provider Department Center  03/21/2021  8:30 AM [facility]  04/14/2021 11:15 AM Anyanwu, Jethro Bastos, MD [facility]    Calvert Cantor, CNM

## 2021-03-16 NOTE — Progress Notes (Signed)
Pt c/o vaginal lesions that occasionally itch

## 2021-03-18 LAB — CERVICOVAGINAL ANCILLARY ONLY
Bacterial Vaginitis (gardnerella): NEGATIVE
Candida Glabrata: NEGATIVE
Candida Vaginitis: POSITIVE — AB
Comment: NEGATIVE
Comment: NEGATIVE
Comment: NEGATIVE

## 2021-03-21 ENCOUNTER — Ambulatory Visit: Payer: Medicaid Other

## 2021-03-23 ENCOUNTER — Other Ambulatory Visit: Payer: Self-pay | Admitting: *Deleted

## 2021-03-23 DIAGNOSIS — O26899 Other specified pregnancy related conditions, unspecified trimester: Secondary | ICD-10-CM

## 2021-03-23 DIAGNOSIS — N898 Other specified noninflammatory disorders of vagina: Secondary | ICD-10-CM

## 2021-03-23 MED ORDER — NYSTATIN 100000 UNIT/GM EX CREA: 1.0000 "application " | TOPICAL_CREAM | Freq: Two times a day (BID) | CUTANEOUS | 1 refills | Status: DC

## 2021-03-23 NOTE — Progress Notes (Signed)
Pt called stating insurance will only pay for 20gm tube, RX changed

## 2021-03-24 ENCOUNTER — Telehealth: Payer: Self-pay

## 2021-03-24 MED ORDER — TERCONAZOLE 0.8 % VA CREA: 1.0000 | TOPICAL_CREAM | Freq: Every day | VAGINAL | 0 refills | Status: DC

## 2021-03-24 NOTE — Addendum Note (Signed)
Addended by: Dalphine Handing on: 03/24/2021 11:31 AM   Modules accepted: Orders

## 2021-03-24 NOTE — Progress Notes (Addendum)
Pt called. She needs Terazol not Nystatin.  Insurance will cover Terazol 3 not Terazol 7 per pt

## 2021-03-24 NOTE — Telephone Encounter (Signed)
TC from patient c/o low abdominal pain and L knee discomfort only when bending down x 3 days.   Denies VB, denies LOF, reports +FM.  Denies traumatic injury to knee, denies hot to touch, denies edema. Consulted with MD, pt to take OTC Ex Strength Tylenol every 4-6 hours prn pain. Do not exceed 4 gram in 24 hrs. If no relief, contact PCP for knee pain.

## 2021-04-06 ENCOUNTER — Other Ambulatory Visit: Payer: Self-pay

## 2021-04-06 ENCOUNTER — Ambulatory Visit: Payer: Medicaid Other | Attending: Obstetrics and Gynecology

## 2021-04-06 DIAGNOSIS — Z3480 Encounter for supervision of other normal pregnancy, unspecified trimester: Secondary | ICD-10-CM | POA: Insufficient documentation

## 2021-04-14 ENCOUNTER — Encounter: Payer: Self-pay | Admitting: Obstetrics & Gynecology

## 2021-04-14 ENCOUNTER — Ambulatory Visit (INDEPENDENT_AMBULATORY_CARE_PROVIDER_SITE_OTHER): Payer: Medicaid Other | Admitting: Obstetrics & Gynecology

## 2021-04-14 ENCOUNTER — Other Ambulatory Visit: Payer: Self-pay

## 2021-04-14 VITALS — BP 124/77 | HR 105 | Wt 171.0 lb

## 2021-04-14 DIAGNOSIS — Z3A22 22 weeks gestation of pregnancy: Secondary | ICD-10-CM

## 2021-04-14 DIAGNOSIS — Z3480 Encounter for supervision of other normal pregnancy, unspecified trimester: Secondary | ICD-10-CM

## 2021-04-14 NOTE — Progress Notes (Signed)
PRENATAL VISIT NOTE  Subjective:  Nicole Mcmahon is a 25 y.o. G3P2002 at [redacted]w[redacted]d being seen today for ongoing prenatal care.  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  Patient reports no complaints.  Contractions: Irritability. Vag. Bleeding: None.  Movement: Present. Denies leaking of fluid.   The following portions of the patient's history were reviewed and updated as appropriate: allergies, current medications, past family history, past medical history, past social history, past surgical history and problem list.   Objective:   Vitals:   04/14/21 1132 04/14/21 1155  BP: (!) 148/75 124/77  Pulse: 87 (!) 105  Weight: 171 lb (77.6 kg)   Patient was rushing for appointment, elevated first BP likely due to this.  Fetal Status: Fetal Heart Rate (bpm): 150   Movement: Present     General:  Alert, oriented and cooperative. Patient is in no acute distress.  Skin: Skin is warm and dry. No rash noted.   Cardiovascular: Normal heart rate noted  Respiratory: Normal respiratory effort, no problems with respiration noted  Abdomen: Soft, gravid, appropriate for gestational age.  Pain/Pressure: Absent     Pelvic: Cervical exam deferred        Extremities: Normal range of motion.  Edema: None  Mental Status: Normal mood and affect. Normal behavior. Normal judgment and thought content.   Imaging: [place] MFM OB COMP + 14 WK  Result Date: 04/07/2021 ----------------------------------------------------------------------  OBSTETRICS REPORT                        (Signed Final 04/07/2021 10:40 am) ---------------------------------------------------------------------- Patient Info  ID #:        [id number]                          D.O.B.:  June 26, 1996 (24 yrs)  Name:       Nicole Mcmahon                  Visit Date: 04/06/2021 01:18 pm ---------------------------------------------------------------------- Performed By  Attending:        Lin Landsman      Ref. Address:      Faculty                    MD  Performed By:     Redgie Grayer      [facility]  Referred By:      Catalina Antigua MD ---------------------------------------------------------------------- Orders  #  Description                           Code        Ordered By  1  [place] MFM OB COMP + 14 WK                76805.01    PEGGY CONSTANT ----------------------------------------------------------------------  #  Order #                     Accession #                Episode #  1  [id number]                   [id number]                 [id number] ---------------------------------------------------------------------- Indications  Encounter for other specified antenatal         Z36.89  screening  [redacted] weeks gestation of pregnancy                 Z3A.21 ---------------------------------------------------------------------- Fetal Evaluation  Num Of Fetuses:          1  Fetal Heart Rate(bpm):   140  Cardiac Activity:        Observed  Presentation:            Breech  Placenta:                Posterior Fundal  P. Cord Insertion:       Visualized, central  Amniotic Fluid  AFI FV:      Within normal limits                              Largest Pocket(cm)                              5.4 ---------------------------------------------------------------------- Biometry  BPD:      51.1  mm     G. Age:  21w 3d         51  %    CI:        70.15   %    70 - 86                                                           FL/HC:       18.3  %    15.9 - 20.3  HC:      194.6  mm     G. Age:  21w 5d         51  %    HC/AC:       1.14       1.06 - 1.25  AC:      170.6  mm     G. Age:  22w 0d         63  %    FL/BPD:  69.7  %  FL:       35.6  mm     G. Age:  21w 2d         35  %    FL/AC:       20.9  %    20 - 24  HUM:      35.9  mm     G. Age:  22w 4d         73  %  CER:      22.8  mm     G. Age:  21w 2d         58  %  NFT:       4.5  mm  LV:          6  mm  CM:        5.4  mm  Est. FW:     442   gm          1 lb     58  % ---------------------------------------------------------------------- OB History  Gravidity:    3         Term:   2  Living:       2 ---------------------------------------------------------------------- Gestational Age  LMP:           21w 3d        Date:  11/07/20                 EDD:   08/14/21  U/S Today:     21w 4d                                        EDD:   08/13/21  Best:          [place] 3d     Det. By:  LMP  (11/07/20)          EDD:   08/14/21 ---------------------------------------------------------------------- Anatomy  Cranium:               Appears normal         LVOT:                   Appears normal  Cavum:                 Appears normal         Aortic Arch:            Appears normal  Ventricles:            Appears normal         Ductal Arch:            Appears normal  Choroid Plexus:        Appears normal         Diaphragm:              Appears normal  Cerebellum:            Appears normal         Stomach:                Appears normal, left  sided  Posterior Fossa:       Appears normal         Abdomen:                Appears normal  Nuchal Fold:           Appears normal         Abdominal Wall:         Appears nml (cord                                                                        insert, abd wall)  Face:                  Appears normal         Cord Vessels:           Appears normal (3                         (orbits  and profile)                           vessel cord)  Lips:                  Appears normal         Kidneys:                Appear normal  Palate:                Appears normal         Bladder:                Appears normal  Thoracic:              Appears normal         Spine:                  Appears normal  Heart:                 Appears normal         Upper Extremities:      Appears normal                         (4CH, axis, and                         situs)  RVOT:                  Appears normal         Lower Extremities:      Appears normal  Other:  Normal genaitalia. Nasal bone, lenses, open hands/ 5th digit, feet/          heels, VC, 3VV, and 3VT visualized. ---------------------------------------------------------------------- Cervix Uterus Adnexa  Cervix  Length:           4.21  cm.  Normal appearance by transabdominal scan.  Uterus  No abnormality visualized.  Right Ovary  Within normal limits.  Left Ovary  Within normal limits.  Adnexa  No adnexal mass visualized. ---------------------------------------------------------------------- Impression  Single intrauterine pregnancy  here for a completed anatomy.  Normal anatomy with measurements consistent with dates  There is good fetal movement and amniotic fluid volume  Ms. Barach reports a low risk NIPS but no documentation is  avaialble at the time of this visit. ---------------------------------------------------------------------- Recommendations  Follow up as clinically indicated. ----------------------------------------------------------------------               Lin Landsman, MD Electronically Signed Final Report   04/07/2021 10:40 am ----------------------------------------------------------------------   Assessment and Plan:  Pregnancy: G3P2002 at [redacted]w[redacted]d 1. [redacted] weeks gestation of pregnancy 2. Supervision of other normal pregnancy, antepartum Anatomy scan normal. Declined AFP screen today.  Preterm labor symptoms and general obstetric  precautions including but not limited to vaginal bleeding, contractions, leaking of fluid and fetal movement were reviewed in detail with the patient. Please refer to After Visit Summary for other counseling recommendations.   Return in about 3 weeks (around 05/05/2021) for OFFICE OB VISIT (MD or APP)  6 weeks: 2 hr GTT, 3rd trimester labs, OFFICE OB VISIT (MD or APP).  Future Appointments  Date Time Provider Department Center  05/09/2021  1:00 PM [facility]  05/09/2021  2:30 PM Daekwon Beswick, Jethro Bastos, MD [facility]    Jaynie Collins, MD

## 2021-05-09 ENCOUNTER — Ambulatory Visit (INDEPENDENT_AMBULATORY_CARE_PROVIDER_SITE_OTHER): Payer: Medicaid Other | Admitting: Obstetrics & Gynecology

## 2021-05-09 ENCOUNTER — Other Ambulatory Visit: Payer: Medicaid Other

## 2021-05-09 ENCOUNTER — Other Ambulatory Visit: Payer: Self-pay

## 2021-05-09 VITALS — BP 117/77 | HR 98 | Wt 172.0 lb

## 2021-05-09 DIAGNOSIS — Z3480 Encounter for supervision of other normal pregnancy, unspecified trimester: Secondary | ICD-10-CM

## 2021-05-09 DIAGNOSIS — Z3A26 26 weeks gestation of pregnancy: Secondary | ICD-10-CM

## 2021-05-09 DIAGNOSIS — O99012 Anemia complicating pregnancy, second trimester: Secondary | ICD-10-CM

## 2021-05-09 NOTE — Progress Notes (Signed)
   PRENATAL VISIT NOTE  Subjective:  Nicole Mcmahon is a 25 y.o. G3P2002 at [redacted]w[redacted]d being seen today for ongoing prenatal care.  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  Patient reports no complaints.  Contractions: Irritability. Vag. Bleeding: None.  Movement: Present. Denies leaking of fluid.   The following portions of the patient's history were reviewed and updated as appropriate: allergies, current medications, past family history, past medical history, past social history, past surgical history and problem list.   Objective:   Vitals:   05/09/21 1422  BP: 117/77  Pulse: 98  Weight: 172 lb (78 kg)    Fetal Status: Fetal Heart Rate (bpm): 145 Fundal Height: 27 cm Movement: Present     General:  Alert, oriented and cooperative. Patient is in no acute distress.  Skin: Skin is warm and dry. No rash noted.   Cardiovascular: Normal heart rate noted  Respiratory: Normal respiratory effort, no problems with respiration noted  Abdomen: Soft, gravid, appropriate for gestational age.  Pain/Pressure: Absent     Pelvic: Cervical exam deferred        Extremities: Normal range of motion.  Edema: None  Mental Status: Normal mood and affect. Normal behavior. Normal judgment and thought content.   Assessment and Plan:  Pregnancy: G3P2002 at [redacted]w[redacted]d 1. [redacted] weeks gestation of pregnancy 2. Supervision of other normal pregnancy, antepartum Labs today, will follow up results and manage accordingly. - Glucose Tolerance, 2 Hours w/1 Hour - CBC - RPR - HIV Antibody (routine testing w rflx) Gave information about Tdap for next visit. Preterm labor symptoms and general obstetric  precautions including but not limited to vaginal bleeding, contractions, leaking of fluid and fetal movement were reviewed in detail with the patient. Please refer to After Visit Summary for other counseling recommendations.   Return in about 3 weeks (around 05/30/2021) for OFFICE OB VISIT (MD or APP), TDap.  Future Appointments  Date Time Provider Department Center  05/09/2021  2:30 PM Cayley Pester, Jethro Bastos, MD [facility]    Jaynie Collins, MD

## 2021-05-09 NOTE — Patient Instructions (Addendum)
Return to office for any scheduled appointments. Call the office or go to the MAU at [facility] [place] if:  You begin to have strong, frequent contractions  Your water breaks.  Sometimes it is a big gush of fluid, sometimes it is just a trickle that keeps getting your panties wet or running down your legs  You have vaginal bleeding.  It is normal to have a small amount of spotting if your cervix was checked.   You do not feel your baby moving like normal.  If you do not, get something to eat and drink and lay down and focus on feeling your baby move.   If your baby is still not moving like normal, you should call the office or go to MAU.  Any other obstetric concerns.  TDaP Vaccine Pregnancy Get the Whooping Cough Vaccine While You Are Pregnant (CDC)  It is important for women to get the whooping cough vaccine in the third trimester of each pregnancy. Vaccines are the best way to prevent this disease. There are 2 different whooping cough vaccines. Both vaccines combine protection against whooping cough, tetanus and diphtheria, but they are for different age groups: Tdap: for everyone 11 years or older, including pregnant women  DTaP: for children 2 months through 6 years of age  You need the whooping cough vaccine during each of your pregnancies The recommended time to get the shot is during your 27th through 36th week of pregnancy, preferably during the earlier part of this time period. The Centers for Disease Control and Prevention (CDC) recommends that pregnant women receive the whooping cough vaccine for adolescents and adults (called Tdap vaccine) during the third trimester of each pregnancy. The recommended time to get the shot is during your 27th through 36th week of pregnancy, preferably during the earlier part of this time period. This replaces the original recommendation that pregnant women get the vaccine only if they had not previously received it. The  American College of Obstetricians and Gynecologists and the American College of Nurse-Midwives support this recommendation.  You should get the whooping cough vaccine while pregnant to pass protection to your baby frame support disabled and/or not supported in this browser  Learn why Laura decided to get the whooping cough vaccine in her 3rd trimester of pregnancy and how her baby girl was born with some protection against the disease. Also available on YouTube. After receiving the whooping cough vaccine, your body will create protective antibodies (proteins produced by the body to fight off diseases) and pass some of them to your baby before birth. These antibodies provide your baby some short-term protection against whooping cough in early life. These antibodies can also protect your baby from some of the more serious complications that come along with whooping cough. Your protective antibodies are at their highest about 2 weeks after getting the vaccine, but it takes time to pass them to your baby. So the preferred time to get the whooping cough vaccine is early in your third trimester. The amount of whooping cough antibodies in your body decreases over time. That is why CDC recommends you get a whooping cough vaccine during each pregnancy. Doing so allows each of your babies to get the greatest number of protective antibodies from you. This means each of your babies will get the best protection possible against this disease.  Getting the whooping cough vaccine while pregnant is better than getting the vaccine after you give birth Whooping cough vaccination during   pregnancy is ideal so your baby will have short-term protection as soon as he is born. This early protection is important because your baby will not start getting his whooping cough vaccines until he is 2 months old. These first few months of life are when your baby is at greatest risk for catching whooping cough. This is also when he's at  greatest risk for having severe, potentially life-threating complications from the infection. To avoid that gap in protection, it is best to get a whooping cough vaccine during pregnancy. You will then pass protection to your baby before he is born. To continue protecting your baby, he should get whooping cough vaccines starting at 2 months old. You may never have gotten the Tdap vaccine before and did not get it during this pregnancy. If so, you should make sure to get the vaccine immediately after you give birth, before leaving the hospital or birthing center. It will take about 2 weeks before your body develops protection (antibodies) in response to the vaccine. Once you have protection from the vaccine, you are less likely to give whooping cough to your newborn while caring for him. But remember, your baby will still be at risk for catching whooping cough from others. A recent study looked to see how effective Tdap was at preventing whooping cough in babies whose mothers got the vaccine while pregnant or in the hospital after giving birth. The study found that getting Tdap between 27 through 36 weeks of pregnancy is 85% more effective at preventing whooping cough in babies younger than 2 months old. Blood tests cannot tell if you need a whooping cough vaccine There are no blood tests that can tell you if you have enough antibodies in your body to protect yourself or your baby against whooping cough. Even if you have been sick with whooping cough in the past or previously received the vaccine, you still should get the vaccine during each pregnancy. Breastfeeding may pass some protective antibodies onto your baby By breastfeeding, you may pass some antibodies you have made in response to the vaccine to your baby. When you get a whooping cough vaccine during your pregnancy, you will have antibodies in your breast milk that you can share with your baby as soon as your milk comes in. However, your baby will not  get protective antibodies immediately if you wait to get the whooping cough vaccine until after delivering your baby. This is because it takes about 2 weeks for your body to create antibodies. Learn more about the health benefits of breastfeeding.  

## 2021-05-10 ENCOUNTER — Encounter: Payer: Self-pay | Admitting: Obstetrics & Gynecology

## 2021-05-10 DIAGNOSIS — O99012 Anemia complicating pregnancy, second trimester: Secondary | ICD-10-CM | POA: Insufficient documentation

## 2021-05-10 HISTORY — DX: Anemia complicating pregnancy, second trimester: O99.012

## 2021-05-10 LAB — CBC
Hematocrit: 31.6 % — ABNORMAL LOW (ref 34.0–46.6)
Hemoglobin: 10.5 g/dL — ABNORMAL LOW (ref 11.1–15.9)
MCH: 30.8 pg (ref 26.6–33.0)
MCHC: 33.2 g/dL (ref 31.5–35.7)
MCV: 93 fL (ref 79–97)
Platelets: 339 10*3/uL (ref 150–450)
RBC: 3.41 x10E6/uL — ABNORMAL LOW (ref 3.77–5.28)
RDW: 11.5 % — ABNORMAL LOW (ref 11.7–15.4)
WBC: 17.3 10*3/uL — ABNORMAL HIGH (ref 3.4–10.8)

## 2021-05-10 LAB — RPR: RPR Ser Ql: NONREACTIVE

## 2021-05-10 LAB — GLUCOSE TOLERANCE, 2 HOURS W/ 1HR
Glucose, 1 hour: 76 mg/dL (ref 70–179)
Glucose, 2 hour: 74 mg/dL (ref 70–152)
Glucose, Fasting: 64 mg/dL — ABNORMAL LOW (ref 70–91)

## 2021-05-10 LAB — HIV ANTIBODY (ROUTINE TESTING W REFLEX): HIV Screen 4th Generation wRfx: NONREACTIVE

## 2021-05-10 MED ORDER — DOCUSATE SODIUM 100 MG PO CAPS: 100.0000 mg | ORAL_CAPSULE | Freq: Two times a day (BID) | ORAL | 2 refills | Status: DC | PRN

## 2021-05-10 MED ORDER — FERROUS SULFATE 325 (65 FE) MG PO TABS: 325.0000 mg | ORAL_TABLET | ORAL | 3 refills | Status: DC

## 2021-05-10 NOTE — Addendum Note (Signed)
Addended by: Jaynie Collins A on: 05/10/2021 08:14 AM   Modules accepted: Orders

## 2021-05-30 ENCOUNTER — Ambulatory Visit (INDEPENDENT_AMBULATORY_CARE_PROVIDER_SITE_OTHER): Payer: Medicaid Other | Admitting: Obstetrics and Gynecology

## 2021-05-30 ENCOUNTER — Other Ambulatory Visit: Payer: Self-pay | Admitting: *Deleted

## 2021-05-30 ENCOUNTER — Encounter: Payer: Self-pay | Admitting: Obstetrics and Gynecology

## 2021-05-30 ENCOUNTER — Other Ambulatory Visit: Payer: Self-pay

## 2021-05-30 VITALS — BP 116/74 | HR 102 | Wt 177.0 lb

## 2021-05-30 DIAGNOSIS — Z2839 Other underimmunization status: Secondary | ICD-10-CM

## 2021-05-30 DIAGNOSIS — O99012 Anemia complicating pregnancy, second trimester: Secondary | ICD-10-CM

## 2021-05-30 DIAGNOSIS — Z3480 Encounter for supervision of other normal pregnancy, unspecified trimester: Secondary | ICD-10-CM

## 2021-05-30 DIAGNOSIS — O09899 Supervision of other high risk pregnancies, unspecified trimester: Secondary | ICD-10-CM

## 2021-05-30 DIAGNOSIS — O09299 Supervision of pregnancy with other poor reproductive or obstetric history, unspecified trimester: Secondary | ICD-10-CM

## 2021-05-30 MED ORDER — BLOOD PRESSURE KIT: 1.0000 | PACK | 0 refills | Status: DC

## 2021-05-30 NOTE — Progress Notes (Signed)
   PRENATAL VISIT NOTE  Subjective:  Nicole Mcmahon is a 25 y.o. G3P2002 at [redacted]w[redacted]d being seen today for ongoing prenatal care.  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  Patient reports no complaints.  Contractions: Irritability. Vag. Bleeding: None.  Movement: Present. Denies leaking of fluid.   The following portions of the patient's history were reviewed and updated as appropriate: allergies, current medications, past family history, past medical history, past social history, past surgical history and problem list.   Objective:   Vitals:   05/30/21 1509  BP: 116/74  Pulse: (!) 102  Weight: 177 lb (80.3 kg)    Fetal Status: Fetal Heart Rate (bpm): 143  Fundal Height: 30 cm Movement: Present     General:  Alert, oriented and cooperative. Patient is in no acute distress.  Skin: Skin is warm and dry. No rash noted.   Cardiovascular: Normal heart rate noted  Respiratory: Normal respiratory effort, no problems with respiration noted  Abdomen: Soft, gravid, appropriate for gestational age.  Pain/Pressure: Present     Pelvic: Cervical exam deferred        Extremities: Normal range of motion.  Edema: None  Mental Status: Normal mood and affect. Normal behavior. Normal judgment and thought content.   Assessment and Plan:  Pregnancy: G3P2002 at [redacted]w[redacted]d 1. Supervision of other normal pregnancy, antepartum Patient is doing well without complaints Patient declined flu and tdap  2. Anemia affecting pregnancy in second trimester Continue ferrous supplement  3. Rubella non-immune status, antepartum Will offer pp  4. History of  pospartum preeclampsia after antepartum GHTN in prior pregnancy, currently pregnant Continue ASA  Preterm labor symptoms and general obstetric precautions including but not limited to vaginal bleeding, contractions, leaking of fluid and fetal movement were reviewed in detail with the patient. Please refer to After Visit Summary for other counseling recommendations.   Return in about 2 weeks (around 06/13/2021) for in person, ROB, Low risk.  No future appointments.  Catalina Antigua, MD

## 2021-06-07 DIAGNOSIS — Z3480 Encounter for supervision of other normal pregnancy, unspecified trimester: Secondary | ICD-10-CM | POA: Diagnosis not present

## 2021-06-22 ENCOUNTER — Other Ambulatory Visit: Payer: Self-pay

## 2021-06-22 ENCOUNTER — Ambulatory Visit (INDEPENDENT_AMBULATORY_CARE_PROVIDER_SITE_OTHER): Payer: Medicaid Other | Admitting: Obstetrics and Gynecology

## 2021-06-22 ENCOUNTER — Ambulatory Visit: Payer: Medicaid Other

## 2021-06-22 ENCOUNTER — Telehealth: Payer: Self-pay

## 2021-06-22 ENCOUNTER — Other Ambulatory Visit: Payer: Self-pay | Admitting: *Deleted

## 2021-06-22 VITALS — BP 130/84 | HR 123 | Wt 182.0 lb

## 2021-06-22 DIAGNOSIS — Z0130 Encounter for examination of blood pressure without abnormal findings: Secondary | ICD-10-CM

## 2021-06-22 DIAGNOSIS — N879 Dysplasia of cervix uteri, unspecified: Secondary | ICD-10-CM

## 2021-06-22 DIAGNOSIS — O09299 Supervision of pregnancy with other poor reproductive or obstetric history, unspecified trimester: Secondary | ICD-10-CM

## 2021-06-22 DIAGNOSIS — Z3480 Encounter for supervision of other normal pregnancy, unspecified trimester: Secondary | ICD-10-CM

## 2021-06-22 NOTE — Telephone Encounter (Signed)
Return call to pt after speaking w/ provider in the office regarding elevated B/P readings. Per Dr.Pickens needs B/P evaluation St[place] Bang  TC to pt to make aware no answer  LVM for pt to contact office back.

## 2021-06-22 NOTE — Telephone Encounter (Signed)
TC from pt just checked B/P at home  First reading 142/85 Pt denies any swelling, no visual changes, no dizziness , +FM , no leaking , no VB, No upper abdominal discomfort  Pt took B/P again per spouse shortly after first reading and B/P read 153/87. Pt advised to relax and after being able to settle down can repeat B/P. Pt has scheduled OB appt tomorrow  Pt advised if feeling ok rest of day/night keep appt. I let pt know I would still consult w/ provider in office.  However, if sx's change pt can go to MAU.

## 2021-06-22 NOTE — Progress Notes (Signed)
erroneous

## 2021-06-23 ENCOUNTER — Encounter: Payer: Self-pay | Admitting: Obstetrics and Gynecology

## 2021-06-23 ENCOUNTER — Encounter: Payer: Medicaid Other | Admitting: Obstetrics & Gynecology

## 2021-06-23 NOTE — Progress Notes (Signed)
° °  PRENATAL VISIT NOTE  Work In Visit  Subjective:  Nicole Mcmahon is a 25 y.o. G3P2002 at [redacted]w[redacted]d being seen today for ongoing prenatal care.  She is currently monitored for the following issues for this low-risk pregnancy and has Rubella non-immune status, antepartum; History of pospartum preeclampsia after antepartum GHTN in prior pregnancy, currently pregnant; Cervical dysplasia; Cervical ectropion; Supervision of other normal pregnancy, antepartum; and Anemia affecting pregnancy in second trimester on their problem list.  Patient got home BP cuff and had mild range BPs (see telephone notes). Pt asymptomatic but recommended confirming her BPs and pt able to come in for visit today.   Contractions: Not present. Vag. Bleeding: None.  Movement: Present. Denies leaking of fluid.   The following portions of the patient's history were reviewed and updated as appropriate: allergies, current medications, past family history, past medical history, past social history, past surgical history and problem list.   Objective:   Vitals:   06/22/21 1626  BP: 130/84  Pulse: (!) 123  Weight: 182 lb (82.6 kg)    Fetal Status: Fetal Heart Rate (bpm): 150 Fundal Height: 32 cm Movement: Present     General:  Alert, oriented and cooperative. Patient is in no acute distress.  Skin: Skin is warm and dry. No rash noted.   Cardiovascular: Normal heart rate noted  Respiratory: Normal respiratory effort, no problems with respiration noted  Abdomen: Soft, gravid, appropriate for gestational age.  Pain/Pressure: Present     Pelvic: Cervical exam deferred        Extremities: Normal range of motion.  Edema: None  Mental Status: Normal mood and affect. Normal behavior. Normal judgment and thought content.   Assessment and Plan:  Pregnancy: G3P2002 at [redacted]w[redacted]d 1. History of pospartum preeclampsia after antepartum GHTN in prior pregnancy, currently pregnant Continue low dose asa  2. Supervision of other normal  pregnancy, antepartum  3. BP check Normal on in office equipment   Preterm labor symptoms and general obstetric precautions including but not limited to vaginal bleeding, contractions, leaking of fluid and fetal movement were reviewed in detail with the patient. Please refer to After Visit Summary for other counseling recommendations.   Return in about 2 weeks (around 07/06/2021) for in person, low risk ob, md or app.  Future Appointments  Date Time Provider Department Center  07/07/2021  2:50 PM [place] Bing, MD [facility]  07/20/2021  1:30 PM Reva Bores, MD [facility]  07/27/2021  2:50 PM Reva Bores, MD [facility]    [place] Bing, MD

## 2021-07-03 NOTE — L&D Delivery Note (Addendum)
Operative Delivery Note I was called to see patient due to decelerations in between contractions. Patient pushed well but had decels to the 80s in between contractions and sometimes with contractions. Fetus +3 BPD, EFW 3500gm and felt direct OP. I told her that I recommended operative vaginal delivery due to fetal decelerations. Risks of operative vaginal delivery (larger laceration, potential injury to baby, etc) discussed with her and husband and they were amenable to vacuum assistance. Peds called and present. Kiwi applied to flexion point and used per [employer] instructions. With the next 3 contractions, with releasing the suction in between contractions and one pop off, she easily delivered a viable and healthy female was delivered at 0151. During the last contraction, the fetus was rotated to direct OA.  Presentation: Baby was initially OP but rotated and delivered ROA with tight nuchal cord x1 loop which was reduced.   APGAR: 5 , 9; 3680gm  NICU NP present at birth. Cord immediately clamped x2 and cut by Dr. Vergie Living.  Pitocin given Placenta status: delivered via constant cord traction, intact.   Cord:  with the following complications:none.  Cord pH:    Latest Reference Range & Units 08/12/21 01:50  pH cord blood (arterial) 7.210 - 7.380  7.08 (LL)  pCO2 cord blood (arterial) 42.0 - 56.0 mmHg 72.0 (H)  Bicarbonate 13.0 - 22.0 mmol/L 13.0 - 22.0 mmol/L 20.8 22.1 (H)  Ph Cord Blood (Venous) 7.240 - 7.380  7.17 (LL)  pCO2 Cord Blood (Venous) 42.0 - 56.0  58.0 (H)   Instruments: Kiwi vacuum Lacerations:  none Est. Blood Loss (mL):  207  Mom to postpartum.  Baby to Couplet care / Skin to Skin.  Erick Alley DO 08/12/2021, 2:11 AM   Attestation of Attending Supervision of Resident: Evaluation and management procedures were performed by the [facility] Resident under my supervision.  I have seen and examined the patient, reviewed the resident's note and chart, and I agree with the  management and plan.   Cornelia Copa MD Attending Center for [employer] Midwife)

## 2021-07-07 ENCOUNTER — Other Ambulatory Visit: Payer: Self-pay

## 2021-07-07 ENCOUNTER — Ambulatory Visit (INDEPENDENT_AMBULATORY_CARE_PROVIDER_SITE_OTHER): Payer: Medicaid Other | Admitting: Obstetrics and Gynecology

## 2021-07-07 ENCOUNTER — Encounter: Payer: Self-pay | Admitting: Obstetrics and Gynecology

## 2021-07-07 VITALS — BP 125/78 | HR 116 | Wt 183.0 lb

## 2021-07-07 DIAGNOSIS — Z3480 Encounter for supervision of other normal pregnancy, unspecified trimester: Secondary | ICD-10-CM

## 2021-07-07 DIAGNOSIS — Z3A34 34 weeks gestation of pregnancy: Secondary | ICD-10-CM

## 2021-07-07 DIAGNOSIS — O09299 Supervision of pregnancy with other poor reproductive or obstetric history, unspecified trimester: Secondary | ICD-10-CM

## 2021-07-07 DIAGNOSIS — O99012 Anemia complicating pregnancy, second trimester: Secondary | ICD-10-CM

## 2021-07-07 NOTE — Progress Notes (Signed)
ROB [redacted]w[redacted]d  CC: None    

## 2021-07-08 NOTE — Progress Notes (Signed)
° °  PRENATAL VISIT NOTE  Subjective:  Nicole Mcmahon is a 26 y.o. G3P2002 at [redacted]w[redacted]d being seen today for ongoing prenatal care.  She is currently monitored for the following issues for this low-risk pregnancy and has Rubella non-immune status, antepartum; History of pospartum preeclampsia after antepartum GHTN in prior pregnancy, currently pregnant; Cervical dysplasia; Cervical ectropion; Supervision of other normal pregnancy, antepartum; and Anemia affecting pregnancy in second trimester on their problem list.  Patient reports no complaints.  Contractions: Not present. Vag. Bleeding: None.  Movement: Present. Denies leaking of fluid.   The following portions of the patient's history were reviewed and updated as appropriate: allergies, current medications, past family history, past medical history, past social history, past surgical history and problem list.   Objective:   Vitals:   07/07/21 1509  BP: 125/78  Pulse: (!) 116  Weight: 183 lb (83 kg)    Fetal Status: Fetal Heart Rate (bpm): 155 Fundal Height: 34 cm Movement: Present  Presentation: Vertex  General:  Alert, oriented and cooperative. Patient is in no acute distress.  Skin: Skin is warm and dry. No rash noted.   Cardiovascular: Normal heart rate noted  Respiratory: Normal respiratory effort, no problems with respiration noted  Abdomen: Soft, gravid, appropriate for gestational age.  Pain/Pressure: Present     Pelvic: Cervical exam deferred        Extremities: Normal range of motion.  Edema: None  Mental Status: Normal mood and affect. Normal behavior. Normal judgment and thought content.   Assessment and Plan:  Pregnancy: G3P2002 at [redacted]w[redacted]d 1. Supervision of other normal pregnancy, antepartum Gbs nv. BC options d/w pt  2. [redacted] weeks gestation of pregnancy  3. History of pospartum preeclampsia after antepartum GHTN in prior pregnancy, currently pregnant With first pregnancy at around 37-38wks; pt states she wasn't taking ASA  in 2nd pregnancy. Continue to follow closely   Preterm labor symptoms and general obstetric precautions including but not limited to vaginal bleeding, contractions, leaking of fluid and fetal movement were reviewed in detail with the patient. Please refer to After Visit Summary for other counseling recommendations.   Return in about 2 weeks (around 07/21/2021) for low risk ob, in person, md or app.  Future Appointments  Date Time Provider Department Center  07/20/2021  1:30 PM Reva Bores, MD [facility]  07/27/2021  2:50 PM Reva Bores, MD [facility]    [place] Bing, MD

## 2021-07-20 ENCOUNTER — Ambulatory Visit (INDEPENDENT_AMBULATORY_CARE_PROVIDER_SITE_OTHER): Payer: Medicaid Other | Admitting: Family Medicine

## 2021-07-20 ENCOUNTER — Other Ambulatory Visit: Payer: Self-pay

## 2021-07-20 ENCOUNTER — Other Ambulatory Visit (HOSPITAL_COMMUNITY)
Admission: RE | Admit: 2021-07-20 | Discharge: 2021-07-20 | Disposition: A | Discharge status: Home / Self Care | Payer: Medicaid Other | Source: Ambulatory Visit | Attending: Family Medicine | Admitting: Family Medicine

## 2021-07-20 VITALS — BP 128/80 | HR 120 | Wt 184.0 lb

## 2021-07-20 DIAGNOSIS — Z2839 Other underimmunization status: Secondary | ICD-10-CM

## 2021-07-20 DIAGNOSIS — Z3480 Encounter for supervision of other normal pregnancy, unspecified trimester: Secondary | ICD-10-CM

## 2021-07-20 DIAGNOSIS — N898 Other specified noninflammatory disorders of vagina: Secondary | ICD-10-CM

## 2021-07-20 DIAGNOSIS — O09899 Supervision of other high risk pregnancies, unspecified trimester: Secondary | ICD-10-CM

## 2021-07-20 DIAGNOSIS — O26899 Other specified pregnancy related conditions, unspecified trimester: Secondary | ICD-10-CM

## 2021-07-20 DIAGNOSIS — O09299 Supervision of pregnancy with other poor reproductive or obstetric history, unspecified trimester: Secondary | ICD-10-CM

## 2021-07-20 LAB — OB RESULTS CONSOLE GC/CHLAMYDIA: Gonorrhea: NEGATIVE

## 2021-07-20 MED ORDER — TERCONAZOLE 0.8 % VA CREA: 1.0000 | TOPICAL_CREAM | Freq: Every day | VAGINAL | 0 refills | Status: DC

## 2021-07-20 NOTE — Progress Notes (Signed)
° ° °  PRENATAL VISIT NOTE  Subjective:  Nicole Mcmahon is a 26 y.o. G3P2002 at 15w3dbeing seen today for ongoing prenatal care.  She is currently monitored for the following issues for this low-risk pregnancy and has Rubella non-immune status, antepartum; History of pospartum preeclampsia after antepartum GHTN in prior pregnancy, currently pregnant; Cervical dysplasia; Cervical ectropion; Supervision of other normal pregnancy, antepartum; and Anemia affecting pregnancy in second trimester on their problem list.  Patient reports no complaints.  Contractions: Irritability. Vag. Bleeding: None.  Movement: Present. Denies leaking of fluid.   The following portions of the patient's history were reviewed and updated as appropriate: allergies, current medications, past family history, past medical history, past social history, past surgical history and problem list.   Objective:   Vitals:   07/20/21 1342  BP: 128/80  Pulse: (!) 120  Weight: 184 lb (83.5 kg)    Fetal Status: Fetal Heart Rate (bpm): 137 Fundal Height: 38 cm Movement: Present  Presentation: Vertex  General:  Alert, oriented and cooperative. Patient is in no acute distress.  Skin: Skin is warm and dry. No rash noted.   Cardiovascular: Normal heart rate noted  Respiratory: Normal respiratory effort, no problems with respiration noted  Abdomen: Soft, gravid, appropriate for gestational age.  Pain/Pressure: Present     Pelvic: Cervical exam performed in the presence of a chaperone Dilation: 3 Effacement (%): Thick Station: -3, -2  Extremities: Normal range of motion.  Edema: None  Mental Status: Normal mood and affect. Normal behavior. Normal judgment and thought content.   Assessment and Plan:  Pregnancy: G3P2002 at 380w3d. Supervision of other normal pregnancy, antepartum Cultures today - Strep Gp B NAA - Cervicovaginal ancillary only( [place])  2. Rubella non-immune status, antepartum Needs MMR pp  3. History of  pospartum preeclampsia after antepartum GHTN in prior pregnancy, currently pregnant BP is ok for now On ASA  4. Vagina itching Check culture and treat appropriately. - Cervicovaginal ancillary only( [place]) - terconazole (TERAZOL 3) 0.8 % vaginal cream; Place 1 applicator vaginally at bedtime. Apply nightly for three nights.  Dispense: 20 g; Refill: 0   Preterm labor symptoms and general obstetric precautions including but not limited to vaginal bleeding, contractions, leaking of fluid and fetal movement were reviewed in detail with the patient. Please refer to After Visit Summary for other counseling recommendations.   Return in 1 week (on 07/27/2021).  Future Appointments  Date Time Provider Department Center  07/27/2021  2:50 PM PrDonnamae JudeMD [facility]  08/10/2021  3:30 PM Anyanwu, UgSallyanne HaversMD [facility]  08/24/2021  2:30 PM PrDonnamae JudeMD [facility]    TaDonnamae JudeMD `

## 2021-07-20 NOTE — Patient Instructions (Signed)

## 2021-07-20 NOTE — Progress Notes (Signed)
Pt c/o increased pelvic pressure Pt thinks she has a yeast infecton Pt c/o sinus pressure

## 2021-07-21 LAB — CERVICOVAGINAL ANCILLARY ONLY
Bacterial Vaginitis (gardnerella): NEGATIVE
Candida Glabrata: NEGATIVE
Candida Vaginitis: NEGATIVE
Chlamydia: NEGATIVE
Comment: NEGATIVE
Comment: NEGATIVE
Comment: NEGATIVE
Comment: NEGATIVE
Comment: NEGATIVE
Comment: NORMAL
Neisseria Gonorrhea: NEGATIVE
Trichomonas: NEGATIVE

## 2021-07-22 ENCOUNTER — Encounter: Payer: Self-pay | Admitting: Family Medicine

## 2021-07-22 DIAGNOSIS — O99820 Streptococcus B carrier state complicating pregnancy: Secondary | ICD-10-CM

## 2021-07-22 HISTORY — DX: Streptococcus B carrier state complicating pregnancy: O99.820

## 2021-07-22 LAB — STREP GP B NAA: Strep Gp B NAA: POSITIVE — AB

## 2021-07-25 DIAGNOSIS — Z5321 Procedure and treatment not carried out due to patient leaving prior to being seen by health care provider: Secondary | ICD-10-CM | POA: Insufficient documentation

## 2021-07-25 DIAGNOSIS — R519 Headache, unspecified: Secondary | ICD-10-CM | POA: Insufficient documentation

## 2021-07-25 DIAGNOSIS — O26893 Other specified pregnancy related conditions, third trimester: Secondary | ICD-10-CM | POA: Insufficient documentation

## 2021-07-25 DIAGNOSIS — O99012 Anemia complicating pregnancy, second trimester: Secondary | ICD-10-CM | POA: Diagnosis not present

## 2021-07-25 DIAGNOSIS — O26899 Other specified pregnancy related conditions, unspecified trimester: Secondary | ICD-10-CM | POA: Insufficient documentation

## 2021-07-25 DIAGNOSIS — O99820 Streptococcus B carrier state complicating pregnancy: Secondary | ICD-10-CM | POA: Diagnosis not present

## 2021-07-25 DIAGNOSIS — Z3A37 37 weeks gestation of pregnancy: Secondary | ICD-10-CM | POA: Diagnosis not present

## 2021-07-26 ENCOUNTER — Other Ambulatory Visit: Payer: Self-pay

## 2021-07-26 ENCOUNTER — Encounter (HOSPITAL_COMMUNITY): Payer: Self-pay

## 2021-07-26 ENCOUNTER — Emergency Department (HOSPITAL_COMMUNITY)
Admission: EM | Admit: 2021-07-26 | Discharge: 2021-07-26 | Disposition: A | Discharge status: Home / Self Care | Payer: Medicaid Other | Attending: Emergency Medicine | Admitting: Emergency Medicine

## 2021-07-26 ENCOUNTER — Inpatient Hospital Stay (EMERGENCY_DEPARTMENT_HOSPITAL)
Admission: AD | Admit: 2021-07-26 | Discharge: 2021-07-26 | Disposition: A | Discharge status: Home / Self Care | Payer: Medicaid Other | Source: Home / Self Care | Attending: Obstetrics and Gynecology | Admitting: Obstetrics and Gynecology

## 2021-07-26 DIAGNOSIS — Z3A37 37 weeks gestation of pregnancy: Secondary | ICD-10-CM

## 2021-07-26 DIAGNOSIS — O99820 Streptococcus B carrier state complicating pregnancy: Secondary | ICD-10-CM

## 2021-07-26 DIAGNOSIS — O99012 Anemia complicating pregnancy, second trimester: Secondary | ICD-10-CM | POA: Diagnosis not present

## 2021-07-26 DIAGNOSIS — O26893 Other specified pregnancy related conditions, third trimester: Secondary | ICD-10-CM | POA: Insufficient documentation

## 2021-07-26 DIAGNOSIS — R519 Headache, unspecified: Secondary | ICD-10-CM | POA: Insufficient documentation

## 2021-07-26 DIAGNOSIS — Z3480 Encounter for supervision of other normal pregnancy, unspecified trimester: Secondary | ICD-10-CM

## 2021-07-26 NOTE — MAU Note (Addendum)
Presents stating the left side of her face is burning and has been occurring for approx 1 week.  Reports pain starts in forehead and radiates down the left side of face, pain is causing her nose & mouth to hurt.  States notices burning after having reflux in her sleep.  Went to [facility] ED, left prior to being evaluated. Denies pregnancy related issues.  No VB or LOF.  Reports +FM.

## 2021-07-26 NOTE — MAU Provider Note (Signed)
None    S Ms. Hania Youngkin is a 26 y.o. [id number] patient who presents to MAU today with complaint of left face pain. Patient reports pain started 1 week ago after she woke up in the middle of the night with acid reflux. Reports she vomited once and ever since has had intermittent, burning, throbbing pain in the left side of her face. Reports pain starts above left eye and radiates down into left cheek. The pain makes her eye and teeth hurt. She denies rash, fever, cough, congestion, or sore throat. Pain is not aggravated by chewing, talking or touch, but is worse when she leans forward. She denies injury or trauma to face. She reports she took Tylenol around 0300, but it did not help. Pain interferes with sleep at night. She reports she has never had a chickenpox infection and is unsure if she has ever had the vaccine.   O BP 134/82 (BP Location: Right Arm)    Pulse (!) 102    Temp 98.1 F (36.7 C) (Oral)    Resp 18    Wt 84.5 kg    LMP 11/07/2020 (Exact Date)    SpO2 100%    BMI 34.07 kg/m   Physical Exam Vitals and nursing note reviewed.  Constitutional:      General: She is not in acute distress. HENT:     Head: Normocephalic and atraumatic.     Comments: Smile is symmetrical     Nose: Nose normal.     Mouth/Throat:     Lips: Pink.     Mouth: Mucous membranes are moist.     Dentition: Normal dentition. No dental abscesses.     Tongue: No lesions.     Pharynx: Oropharynx is clear.  Eyes:     General: Lids are normal.        Right eye: No discharge.        Left eye: No discharge.     Extraocular Movements: Extraocular movements intact.     Conjunctiva/sclera: Conjunctivae normal.     Pupils: Pupils are equal, round, and reactive to light.     Comments: No swelling  Cardiovascular:     Rate and Rhythm: Normal rate.  Pulmonary:     Effort: Pulmonary effort is normal.  Abdominal:     Palpations: Abdomen is soft.     Tenderness: There is no abdominal tenderness.   Musculoskeletal:        General: Normal range of motion.  Skin:    General: Skin is warm and dry.  Neurological:     General: No focal deficit present.     Mental Status: She is alert and oriented to person, place, and time.     Cranial Nerves: No cranial nerve deficit.     Sensory: No sensory deficit.  Psychiatric:        Mood and Affect: Mood normal.        Behavior: Behavior normal.        Thought Content: Thought content normal.        Judgment: Judgment normal.   FHT: 143 bpm via doppler  A Medical screening exam complete [redacted] weeks gestation of pregnancy Left facial pain   P Discharge from MAU in stable condition Patient has OB appointment tomorrow. Instructed to keep as scheduled May use cold compress and Tylenol for pain.  Warning signs for worsening condition that would warrant emergency follow-up discussed Patient may return to MAU as needed     Moshe Cipro, Eartha Inch,  CNM 07/26/2021 1:52 PM

## 2021-07-26 NOTE — ED Triage Notes (Addendum)
Reports left sided facial pain and burning x 1 week. Pt unsure if she has a dental infection or migraine. [redacted] weeks pregnant.

## 2021-07-27 ENCOUNTER — Encounter: Payer: Self-pay | Admitting: Family Medicine

## 2021-07-27 ENCOUNTER — Ambulatory Visit (INDEPENDENT_AMBULATORY_CARE_PROVIDER_SITE_OTHER): Payer: Medicaid Other | Admitting: Family Medicine

## 2021-07-27 VITALS — BP 134/85 | HR 121 | Wt 190.0 lb

## 2021-07-27 DIAGNOSIS — Z3A37 37 weeks gestation of pregnancy: Secondary | ICD-10-CM

## 2021-07-27 DIAGNOSIS — Z3480 Encounter for supervision of other normal pregnancy, unspecified trimester: Secondary | ICD-10-CM

## 2021-07-27 DIAGNOSIS — G500 Trigeminal neuralgia: Secondary | ICD-10-CM

## 2021-07-27 DIAGNOSIS — O99820 Streptococcus B carrier state complicating pregnancy: Secondary | ICD-10-CM

## 2021-07-27 DIAGNOSIS — O99012 Anemia complicating pregnancy, second trimester: Secondary | ICD-10-CM

## 2021-07-27 DIAGNOSIS — O09893 Supervision of other high risk pregnancies, third trimester: Secondary | ICD-10-CM

## 2021-07-27 DIAGNOSIS — Z2839 Other underimmunization status: Secondary | ICD-10-CM

## 2021-07-27 MED ORDER — CYCLOBENZAPRINE HCL 10 MG PO TABS: 10.0000 mg | ORAL_TABLET | Freq: Three times a day (TID) | ORAL | 1 refills | Status: DC | PRN

## 2021-07-27 NOTE — Progress Notes (Signed)
° ° °  PRENATAL VISIT NOTE  Subjective:  Nicole Mcmahon is a 26 y.o. G3P2002 at [redacted]w[redacted]d being seen today for ongoing prenatal care.  She is currently monitored for the following issues for this high-risk pregnancy and has Rubella non-immune status, antepartum; History of pospartum preeclampsia after antepartum GHTN in prior pregnancy, currently pregnant; Cervical dysplasia; Cervical ectropion; Supervision of other normal pregnancy, antepartum; Anemia affecting pregnancy in second trimester; and Group B Streptococcus carrier, +RV culture, currently pregnant on their problem list.  Patient reports  left sided facial pain that started with acid reflux. Her pain is severe, constant and not related to headache. She has no rash. She notes it is worse with leaning forward. Does not think she has a bad tooth, but is having trouble sleeping. She is tearful in the office today. She does not have any weakness.  Contractions: Irritability. Vag. Bleeding: None.  Movement: Present. Denies leaking of fluid.   The following portions of the patient's history were reviewed and updated as appropriate: allergies, current medications, past family history, past medical history, past social history, past surgical history and problem list.   Objective:   Vitals:   07/27/21 1518  BP: 134/85  Pulse: (!) 121  Weight: 190 lb (86.2 kg)    Fetal Status: Fetal Heart Rate (bpm): 136   Movement: Present     General:  Alert, oriented and cooperative. Patient is in no acute distress.  Skin: Skin is warm and dry. No rash noted.   Cardiovascular: Normal heart rate noted  Respiratory: Normal respiratory effort, no problems with respiration noted  Abdomen: Soft, gravid, appropriate for gestational age.  Pain/Pressure: Absent     Pelvic: Cervical exam deferred        Neurological: Symmetric smile, tongue is midline, no facial droop, has pain over eye, along maxilla and mandible--not reproducible  Extremities: Normal range of  motion.  Edema: Trace  Mental Status: Normal mood and affect. Normal behavior. Normal judgment and thought content.   Assessment and Plan:  Pregnancy: G3P2002 at [redacted]w[redacted]d 1. Anemia affecting pregnancy in second trimester On iron repletion  2. Supervision of other normal pregnancy, antepartum Continue routine prenatal care.  3. Group B Streptococcus carrier, +RV culture, currently pregnant Will need treatment in labor  4. [redacted] weeks gestation of pregnancy   5. Rubella non-immune status, antepartum Will need MMR pp  6. Trigeminal neuralgia of left side of face Suspected - DDx includes: dental, Varicella zoster (thought less likely given 2 weeks and no rash) Most treatment options are not compatible with pregnancy. She is begging for something, will attempt some flexeril. - Ambulatory referral to Neurology - cyclobenzaprine (FLEXERIL) 10 MG tablet; Take 1 tablet (10 mg total) by mouth every 8 (eight) hours as needed for muscle spasms.  Dispense: 30 tablet; Refill: 1  Term labor symptoms and general obstetric precautions including but not limited to vaginal bleeding, contractions, leaking of fluid and fetal movement were reviewed in detail with the patient. Please refer to After Visit Summary for other counseling recommendations.   Return in 1 week (on 08/03/2021) for has referral to Neuro.  Future Appointments  Date Time Provider [place]  08/01/2021  2:30 PM Caren Macadam, MD [facility]  08/10/2021  3:30 PM Anyanwu, Sallyanne Havers, MD [facility]    Donnamae Jude, MD

## 2021-07-28 ENCOUNTER — Telehealth: Payer: Self-pay

## 2021-07-28 NOTE — Telephone Encounter (Signed)
Informed the pt that she can take tylenol during the day and her muscle relaxer at night for the drowsiness

## 2021-08-01 ENCOUNTER — Other Ambulatory Visit: Payer: Self-pay

## 2021-08-01 ENCOUNTER — Encounter: Payer: Self-pay | Admitting: Neurology

## 2021-08-01 ENCOUNTER — Ambulatory Visit (INDEPENDENT_AMBULATORY_CARE_PROVIDER_SITE_OTHER): Payer: Medicaid Other | Admitting: Family Medicine

## 2021-08-01 ENCOUNTER — Ambulatory Visit: Payer: Medicaid Other | Admitting: Neurology

## 2021-08-01 ENCOUNTER — Encounter: Payer: Self-pay | Admitting: Family Medicine

## 2021-08-01 VITALS — BP 129/80 | HR 102 | Wt 187.0 lb

## 2021-08-01 VITALS — BP 128/77 | HR 96 | Ht 62.0 in | Wt 190.0 lb

## 2021-08-01 DIAGNOSIS — G43709 Chronic migraine without aura, not intractable, without status migrainosus: Secondary | ICD-10-CM

## 2021-08-01 DIAGNOSIS — O09299 Supervision of pregnancy with other poor reproductive or obstetric history, unspecified trimester: Secondary | ICD-10-CM

## 2021-08-01 DIAGNOSIS — Z3480 Encounter for supervision of other normal pregnancy, unspecified trimester: Secondary | ICD-10-CM

## 2021-08-01 DIAGNOSIS — O99012 Anemia complicating pregnancy, second trimester: Secondary | ICD-10-CM

## 2021-08-01 DIAGNOSIS — R519 Headache, unspecified: Secondary | ICD-10-CM | POA: Diagnosis not present

## 2021-08-01 DIAGNOSIS — O99820 Streptococcus B carrier state complicating pregnancy: Secondary | ICD-10-CM

## 2021-08-01 HISTORY — DX: Chronic migraine without aura, not intractable, without status migrainosus: G43.709

## 2021-08-01 NOTE — Progress Notes (Signed)
° °  PRENATAL VISIT NOTE  Subjective:  Nicole Mcmahon is a 26 y.o. G3P2002 at [redacted]w[redacted]d being seen today for ongoing prenatal care.  She is currently monitored for the following issues for this low-risk pregnancy and has Rubella non-immune status, antepartum; History of pospartum preeclampsia after antepartum GHTN in prior pregnancy, currently pregnant; Cervical dysplasia; Cervical ectropion; Supervision of other normal pregnancy, antepartum; Anemia affecting pregnancy in second trimester; Group B Streptococcus carrier, +RV culture, currently pregnant; Left facial pain; and Chronic migraine w/o aura w/o status migrainosus, not intractable on their problem list.  Patient reports no complaints.  Contractions: Irregular. Vag. Bleeding: None.  Movement: Present. Denies leaking of fluid.   The following portions of the patient's history were reviewed and updated as appropriate: allergies, current medications, past family history, past medical history, past social history, past surgical history and problem list.   Objective:   Vitals:   08/01/21 1439  BP: 129/80  Pulse: (!) 102  Weight: 187 lb (84.8 kg)    Fetal Status: Fetal Heart Rate (bpm): 149 Fundal Height: 38 cm Movement: Present  Presentation: Vertex  General:  Alert, oriented and cooperative. Patient is in no acute distress.  Skin: Skin is warm and dry. No rash noted.   Cardiovascular: Normal heart rate noted  Respiratory: Normal respiratory effort, no problems with respiration noted  Abdomen: Soft, gravid, appropriate for gestational age.  Pain/Pressure: Present     Pelvic: Cervical exam performed in the presence of a chaperone Dilation: 2 Effacement (%): Thick Station: -3  Extremities: Normal range of motion.  Edema: Trace  Mental Status: Normal mood and affect. Normal behavior. Normal judgment and thought content.   Assessment and Plan:  Pregnancy: G3P2002 at [redacted]w[redacted]d 1. Group B Streptococcus carrier, +RV culture, currently pregnant PCN  in labor  2. Supervision of other normal pregnancy, antepartum UTD Recommended spinning babies, feels like fetal hand is in front of head likely contributing to her pressure. Recommended walking to help baby descent.  3. Anemia affecting pregnancy in second trimester HGB stable, last check was 10.5 in Nov Continue ferrous sulfate  4. History of pospartum preeclampsia after antepartum GHTN in prior pregnancy, currently pregnant BP WNL  Term labor symptoms and general obstetric precautions including but not limited to vaginal bleeding, contractions, leaking of fluid and fetal movement were reviewed in detail with the patient. Please refer to After Visit Summary for other counseling recommendations.   Return in about 1 week (around 08/08/2021) for Routine prenatal care.  Future Appointments  Date Time Provider Department Center  08/10/2021  3:30 PM Anyanwu, Jethro Bastos, MD [facility]    Federico Flake, MD

## 2021-08-01 NOTE — Progress Notes (Signed)
Chief Complaint  Patient presents with   New Patient (Initial Visit)    Rm 14. Alone. PCP is Caren Macadam. NP/Internal referral for trigeminal neuralgia. Pt states pain is located on the left side of face. Pt reports cyclobenzaprine is helping with facial pain, relieves burning sensation.      ASSESSMENT AND PLAN  Nicole Mcmahon is a 26 y.o. female   Left-sided headache  Most consistent with migraine headache  She may take Flexeril, Tylenol as needed,  She is currently [redacted] weeks pregnant, if she continues to have frequent headache postpartum, may consider triptan treatment,   DIAGNOSTIC DATA (LABS, IMAGING, TESTING) - I reviewed patient records, labs, notes, testing and imaging myself where available.   MEDICAL HISTORY:  Nicole Mcmahon is a 26 year old female, seen in request by Dr. Darron Doom for evaluation of left facial pain, her primary care physician is Dr.Newton, Juanita Craver, initial evaluation was August 01, 2021  I reviewed and summarized the referring note. PMHX Sick Cell Trait  She is currently [redacted] weeks pregnant  Patient reported about few weeks ago, without clear triggers, she noticed left facial pain, felt left eye was swelling, pain behind left eye burning sensation, lasting for few hours, she has difficulty sleeping, most symptoms happen at nighttime, her symptoms last about 2 weeks intermittently  Eventually she was given Flexeril 10 mg 3 times daily, as needed, she was able to catch a good night sleep, she no longer have similar symptoms,  She did have a history of intermittent headache in the past, couple times each months, holoacranial pressure headache, with mild light noise sensitivity, usually relieved by Tylenol  Laboratory evaluation in 2022 showed normal or  negative HIV, RPR, hemoglobin 10.5 PHYSICAL EXAM:   Vitals:   08/01/21 0745  BP: 128/77  Pulse: 96  Weight: 190 lb (86.2 kg)  Height: _0  (1.575 m)   Not recorded      Body mass index is 34.75 kg/m.  PHYSICAL EXAMNIATION:  Gen: NAD, conversant, well nourised, well groomed                     Cardiovascular: Regular rate rhythm, no peripheral edema, warm, nontender. Eyes: Conjunctivae clear without exudates or hemorrhage Neck: Supple, no carotid bruits. Pulmonary: Clear to auscultation bilaterally   NEUROLOGICAL EXAM:  MENTAL STATUS: Speech:    Speech is normal; fluent and spontaneous with normal comprehension.  Cognition:     Orientation to time, place and person     Normal recent and remote memory     Normal Attention span and concentration     Normal Language, naming, repeating,spontaneous speech     Fund of knowledge   CRANIAL NERVES: CN II: Visual fields are full to confrontation. Pupils are round equal and briskly reactive to light. CN III, IV, VI: extraocular movement are normal. No ptosis. CN V: Facial sensation is intact to light touch CN VII: Face is symmetric with normal eye closure  CN VIII: Hearing is normal to causal conversation. CN IX, X: Phonation is normal. CN XI: Head turning and shoulder shrug are intact  MOTOR: There is no pronator drift of out-stretched arms. Muscle bulk and tone are normal. Muscle strength is normal.  REFLEXES: Reflexes are 2+ and symmetric at the biceps, triceps, knees, and ankles. Plantar responses are flexor.  SENSORY: Intact to light touch, pinprick and vibratory sensation are intact in fingers and toes.  COORDINATION: There is no trunk or limb dysmetria noted.  GAIT/STANCE: Posture is normal. Gait is steady with normal steps, base, arm swing, and turning. Heel and toe walking are normal. Tandem gait is normal.  Romberg is absent.  REVIEW OF SYSTEMS:  Full 14 system review of systems performed and notable only for as above All other review of systems were negative.   ALLERGIES: No Known Allergies  HOME MEDICATIONS: Current Outpatient Medications  Medication Sig Dispense  Refill   aspirin EC 81 MG tablet Take 1 tablet (81 mg total) by mouth daily. Take after 12 weeks for prevention of preeclampsia later in pregnancy 300 tablet 2   Blood Pressure KIT 1 Device by Does not apply route once a week. To be monitored weekly from home 1 kit 0   cyclobenzaprine (FLEXERIL) 10 MG tablet Take 1 tablet (10 mg total) by mouth every 8 (eight) hours as needed for muscle spasms. 30 tablet 1   docusate sodium (COLACE) 100 MG capsule Take 1 capsule (100 mg total) by mouth 2 (two) times daily as needed for mild constipation or moderate constipation. 30 capsule 2   ferrous sulfate (FERROUSUL) 325 (65 FE) MG tablet Take 1 tablet (325 mg total) by mouth every other day. 30 tablet 3   nystatin cream (MYCOSTATIN) Apply 1 application topically 2 (two) times daily. 20 g 1   terconazole (TERAZOL 3) 0.8 % vaginal cream Place 1 applicator vaginally at bedtime. Apply nightly for three nights. 20 g 0   acetaminophen (TYLENOL) 325 MG tablet Take 2 tablets (650 mg total) by mouth every 6 (six) hours as needed for up to 30 doses. (Patient not taking: Reported on 08/01/2021) 30 tablet 0   No current facility-administered medications for this visit.    PAST MEDICAL HISTORY: Past Medical History:  Diagnosis Date   Asthma    Blood dyscrasia    H/O genital injury    Childhood injury where she jumped and landed on a stick that went into her vagina.    Preeclampsia in postpartum period 05/10/2017   Pregnancy induced hypertension    Sickle cell trait ([place])     PAST SURGICAL HISTORY: Past Surgical History:  Procedure Laterality Date   NO PAST SURGERIES      FAMILY HISTORY: Family History  Problem Relation Age of Onset   Cancer Paternal Grandmother    Lupus Paternal Grandmother    Cancer Paternal Aunt     SOCIAL HISTORY: Social History   Socioeconomic History   Marital status: Married    Spouse name: Not on file   Number of children: Not on file   Years of education: Not on file    Highest education level: Not on file  Occupational History   Not on file  Tobacco Use   Smoking status: Never   Smokeless tobacco: Never  Vaping Use   Vaping Use: Never used  Substance and Sexual Activity   Alcohol use: No   Drug use: No   Sexual activity: Yes  Other Topics Concern   Not on file  Social History Narrative   Not on file   Social Determinants of Health   Financial Resource Strain: Not on file  Food Insecurity: Not on file  Transportation Needs: Not on file  Physical Activity: Not on file  Stress: Not on file  Social Connections: Not on file  Intimate Partner Violence: Not on file      Marcial Pacas, M.D. Ph.D.  [facility] [address], [place],  [address] Ph: [phone] Fax: [phone]  CC:  Donnamae Jude, MD [place],   [id number]  Caren Macadam, MD (okay 9, need office for annual exam is just a migraine headache does not sound like trigeminal neuralgia  Nodule

## 2021-08-10 ENCOUNTER — Encounter (HOSPITAL_COMMUNITY): Payer: Self-pay | Admitting: Family Medicine

## 2021-08-10 ENCOUNTER — Other Ambulatory Visit: Payer: Self-pay

## 2021-08-10 ENCOUNTER — Ambulatory Visit (INDEPENDENT_AMBULATORY_CARE_PROVIDER_SITE_OTHER): Payer: Medicaid Other | Admitting: Obstetrics & Gynecology

## 2021-08-10 ENCOUNTER — Inpatient Hospital Stay (HOSPITAL_COMMUNITY)
Admission: AD | Admit: 2021-08-10 | Discharge: 2021-08-13 | DRG: 807 | Disposition: A | Discharge status: Home / Self Care | Payer: Medicaid Other | Attending: Obstetrics and Gynecology | Admitting: Obstetrics and Gynecology

## 2021-08-10 VITALS — BP 142/81 | HR 109 | Wt 190.0 lb

## 2021-08-10 DIAGNOSIS — N879 Dysplasia of cervix uteri, unspecified: Secondary | ICD-10-CM | POA: Diagnosis present

## 2021-08-10 DIAGNOSIS — O9902 Anemia complicating childbirth: Secondary | ICD-10-CM | POA: Diagnosis present

## 2021-08-10 DIAGNOSIS — O99820 Streptococcus B carrier state complicating pregnancy: Secondary | ICD-10-CM

## 2021-08-10 DIAGNOSIS — Z3A39 39 weeks gestation of pregnancy: Secondary | ICD-10-CM

## 2021-08-10 DIAGNOSIS — O163 Unspecified maternal hypertension, third trimester: Secondary | ICD-10-CM

## 2021-08-10 DIAGNOSIS — O99824 Streptococcus B carrier state complicating childbirth: Secondary | ICD-10-CM | POA: Diagnosis present

## 2021-08-10 DIAGNOSIS — O0993 Supervision of high risk pregnancy, unspecified, third trimester: Secondary | ICD-10-CM

## 2021-08-10 DIAGNOSIS — O1404 Mild to moderate pre-eclampsia, complicating childbirth: Principal | ICD-10-CM | POA: Diagnosis present

## 2021-08-10 DIAGNOSIS — O09899 Supervision of other high risk pregnancies, unspecified trimester: Secondary | ICD-10-CM

## 2021-08-10 DIAGNOSIS — Z3480 Encounter for supervision of other normal pregnancy, unspecified trimester: Secondary | ICD-10-CM

## 2021-08-10 DIAGNOSIS — D573 Sickle-cell trait: Secondary | ICD-10-CM | POA: Diagnosis not present

## 2021-08-10 DIAGNOSIS — R030 Elevated blood-pressure reading, without diagnosis of hypertension: Secondary | ICD-10-CM | POA: Diagnosis not present

## 2021-08-10 DIAGNOSIS — N86 Erosion and ectropion of cervix uteri: Secondary | ICD-10-CM | POA: Diagnosis present

## 2021-08-10 DIAGNOSIS — Z2839 Other underimmunization status: Secondary | ICD-10-CM

## 2021-08-10 DIAGNOSIS — Z20822 Contact with and (suspected) exposure to covid-19: Secondary | ICD-10-CM | POA: Diagnosis not present

## 2021-08-10 DIAGNOSIS — O99012 Anemia complicating pregnancy, second trimester: Secondary | ICD-10-CM | POA: Diagnosis present

## 2021-08-10 DIAGNOSIS — O1400 Mild to moderate pre-eclampsia, unspecified trimester: Secondary | ICD-10-CM | POA: Diagnosis present

## 2021-08-10 DIAGNOSIS — O09299 Supervision of pregnancy with other poor reproductive or obstetric history, unspecified trimester: Secondary | ICD-10-CM

## 2021-08-10 HISTORY — DX: Anemia, unspecified: D64.9

## 2021-08-10 LAB — COMPREHENSIVE METABOLIC PANEL
ALT: 11 U/L (ref 0–44)
AST: 19 U/L (ref 15–41)
Albumin: 2.8 g/dL — ABNORMAL LOW (ref 3.5–5.0)
Alkaline Phosphatase: 152 U/L — ABNORMAL HIGH (ref 38–126)
Anion gap: 9 (ref 5–15)
BUN: 5 mg/dL — ABNORMAL LOW (ref 6–20)
CO2: 19 mmol/L — ABNORMAL LOW (ref 22–32)
Calcium: 9 mg/dL (ref 8.9–10.3)
Chloride: 106 mmol/L (ref 98–111)
Creatinine, Ser: 0.56 mg/dL (ref 0.44–1.00)
GFR, Estimated: >60 mL/min (ref >60)
Glucose, Bld: 79 mg/dL (ref 70–99)
Potassium: 3.6 mmol/L (ref 3.5–5.1)
Sodium: 134 mmol/L — ABNORMAL LOW (ref 135–145)
Total Bilirubin: 0.6 mg/dL (ref 0.3–1.2)
Total Protein: 6.6 g/dL (ref 6.5–8.1)

## 2021-08-10 LAB — CBC WITH DIFFERENTIAL/PLATELET
Abs Immature Granulocytes: 0.11 10*3/uL — ABNORMAL HIGH (ref 0.00–0.07)
Basophils Absolute: 0 10*3/uL (ref 0.0–0.1)
Basophils Relative: 0 %
Eosinophils Absolute: 0.2 10*3/uL (ref 0.0–0.5)
Eosinophils Relative: 1 %
HCT: 29.7 % — ABNORMAL LOW (ref 36.0–46.0)
Hemoglobin: 9.1 g/dL — ABNORMAL LOW (ref 12.0–15.0)
Immature Granulocytes: 1 %
Lymphocytes Relative: 14 %
Lymphs Abs: 2.2 10*3/uL (ref 0.7–4.0)
MCH: 26.1 pg (ref 26.0–34.0)
MCHC: 30.6 g/dL (ref 30.0–36.0)
MCV: 85.3 fL (ref 80.0–100.0)
Monocytes Absolute: 1 10*3/uL (ref 0.1–1.0)
Monocytes Relative: 6 %
Neutro Abs: 11.9 10*3/uL — ABNORMAL HIGH (ref 1.7–7.7)
Neutrophils Relative %: 78 %
Platelets: 395 10*3/uL (ref 150–400)
RBC: 3.48 MIL/uL — ABNORMAL LOW (ref 3.87–5.11)
RDW: 15.3 % (ref 11.5–15.5)
WBC: 15.5 10*3/uL — ABNORMAL HIGH (ref 4.0–10.5)
nRBC: 0 % (ref 0.0–0.2)

## 2021-08-10 LAB — URINALYSIS, ROUTINE W REFLEX MICROSCOPIC
Bilirubin Urine: NEGATIVE
Glucose, UA: NEGATIVE mg/dL
Hgb urine dipstick: NEGATIVE
Ketones, ur: NEGATIVE mg/dL
Nitrite: NEGATIVE
Protein, ur: NEGATIVE mg/dL
Specific Gravity, Urine: 1.008 (ref 1.005–1.030)
pH: 7 (ref 5.0–8.0)

## 2021-08-10 LAB — PROTEIN / CREATININE RATIO, URINE
Creatinine, Urine: 60.62 mg/dL
Protein Creatinine Ratio: 0.31 mg/mg{Cre} — ABNORMAL HIGH (ref 0.00–0.15)
Total Protein, Urine: 19 mg/dL

## 2021-08-10 LAB — TYPE AND SCREEN
ABO/RH(D): O POS
Antibody Screen: NEGATIVE

## 2021-08-10 LAB — RESP PANEL BY RT-PCR (FLU A&B, COVID) ARPGX2
Influenza A by PCR: NEGATIVE
Influenza B by PCR: NEGATIVE
SARS Coronavirus 2 by RT PCR: NEGATIVE

## 2021-08-10 MED ORDER — ONDANSETRON HCL 4 MG/2ML IJ SOLN: 4.0000 mg | Freq: Four times a day (QID) | INTRAMUSCULAR | Status: DC | PRN

## 2021-08-10 MED ORDER — OXYCODONE-ACETAMINOPHEN 5-325 MG PO TABS: 1.0000 | ORAL_TABLET | ORAL | Status: DC | PRN

## 2021-08-10 MED ORDER — LACTATED RINGERS IV SOLN: 500.0000 mL | INTRAVENOUS | Status: DC | PRN

## 2021-08-10 MED ORDER — LIDOCAINE HCL (PF) 1 % IJ SOLN: 30.0000 mL | INTRAMUSCULAR | Status: DC | PRN

## 2021-08-10 MED ORDER — ACETAMINOPHEN 325 MG PO TABS
650.0000 mg | ORAL_TABLET | ORAL | Status: DC | PRN
  Administered 2021-08-12: 650 mg via ORAL
  Filled 2021-08-10: qty 2

## 2021-08-10 MED ORDER — LACTATED RINGERS IV SOLN: INTRAVENOUS | Status: DC

## 2021-08-10 MED ORDER — PENICILLIN G POT IN DEXTROSE 60000 UNIT/ML IV SOLN
3.0000 10*6.[IU] | INTRAVENOUS | Status: DC
  Administered 2021-08-11 (×5): 3 10*6.[IU] via INTRAVENOUS
  Filled 2021-08-10 (×5): qty 50

## 2021-08-10 MED ORDER — OXYCODONE-ACETAMINOPHEN 5-325 MG PO TABS: 2.0000 | ORAL_TABLET | ORAL | Status: DC | PRN

## 2021-08-10 MED ORDER — FAMOTIDINE IN NACL 20-0.9 MG/50ML-% IV SOLN
20.0000 mg | Freq: Two times a day (BID) | INTRAVENOUS | Status: DC
  Filled 2021-08-10: qty 50

## 2021-08-10 MED ORDER — FAMOTIDINE 20 MG PO TABS
20.0000 mg | ORAL_TABLET | Freq: Once | ORAL | Status: AC
  Administered 2021-08-10: 20 mg via ORAL
  Filled 2021-08-10: qty 1

## 2021-08-10 MED ORDER — SOD CITRATE-CITRIC ACID 500-334 MG/5ML PO SOLN
30.0000 mL | ORAL | Status: DC | PRN
  Administered 2021-08-12: 30 mL via ORAL
  Filled 2021-08-10: qty 30

## 2021-08-10 MED ORDER — OXYTOCIN-SODIUM CHLORIDE 30-0.9 UT/500ML-% IV SOLN
2.5000 [IU]/h | INTRAVENOUS | Status: DC
  Administered 2021-08-12: 2.5 [IU]/h via INTRAVENOUS
  Filled 2021-08-10: qty 500

## 2021-08-10 MED ORDER — SODIUM CHLORIDE 0.9 % IV SOLN
5.0000 10*6.[IU] | Freq: Once | INTRAVENOUS | Status: AC
  Administered 2021-08-10: 5 10*6.[IU] via INTRAVENOUS
  Filled 2021-08-10: qty 5

## 2021-08-10 MED ORDER — OXYTOCIN BOLUS FROM INFUSION
333.0000 mL | Freq: Once | INTRAVENOUS | Status: AC
  Administered 2021-08-12: 333 mL via INTRAVENOUS

## 2021-08-10 NOTE — Patient Instructions (Signed)
Return to office for any scheduled appointments. Call the office or go to the MAU at [facility] if:  You begin to have strong, frequent contractions  Your water breaks.  Sometimes it is a big gush of fluid, sometimes it is just a trickle that keeps getting your panties wet or running down your legs  You have vaginal bleeding.  It is normal to have a small amount of spotting if your cervix was checked.   You do not feel your baby moving like normal.  If you do not, get something to eat and drink and lay down and focus on feeling your baby move.   If your baby is still not moving like normal, you should call the office or go to MAU.  Any other obstetric concerns.   

## 2021-08-10 NOTE — H&P (Signed)
OBSTETRIC ADMISSION HISTORY AND PHYSICAL  Nicole Mcmahon is a 26 y.o. female G3P2002 with IUP at 98w3dby LMP presenting for elevated blood pressures in office and now with gHTN. When seen in the office today she had BP that were in the 140s. A direct admission for IOL was recommended however she was hesitant and preferred evaluation in the MAU. In the MAU her BP was in the 140s as well and she officially met criteria for gHTN (4 hour after her BP check in office).  She denies headaches or blurry vision. She reports +FMs, No LOF, no VB, or peripheral edema, and RUQ pain.  She plans on breast feeding. She is unsure about birth control. [facility]   Dating: By LMP --->  Estimated Date of Delivery: 08/14/21  Sono:   @[redacted]w[redacted]d , CWD, normal anatomy, breech presentation, posterior fundal placental lie, 442g, 58% EFW   Prenatal History/Complications:  gHTN (diagnosed today) GBS positive Anemia affecting pregnancy Hx of post partum preE in prior pregnancy  Past Medical History: Past Medical History:  Diagnosis Date   Asthma    Blood dyscrasia    H/O genital injury    Childhood injury where she jumped and landed on a stick that went into her vagina.    Preeclampsia in postpartum period 05/10/2017   Pregnancy induced hypertension    Sickle cell trait ([facility])     Past Surgical History: Past Surgical History:  Procedure Laterality Date   NO PAST SURGERIES      Obstetrical History: OB History     Gravida  3   Para  2   Term  2   Preterm      AB      Living  2      SAB      IAB      Ectopic      Multiple  0   Live Births  2           Social History Social History   Socioeconomic History   Marital status: Married    Spouse name: Not on file   Number of children: Not on file   Years of education: Not on file   Highest education level: Not on file  Occupational History   Not on file  Tobacco Use   Smoking status: Never   Smokeless  tobacco: Never  Vaping Use   Vaping Use: Never used  Substance and Sexual Activity   Alcohol use: No   Drug use: No   Sexual activity: Yes  Other Topics Concern   Not on file  Social History Narrative   Not on file   Social Determinants of Health   Financial Resource Strain: Not on file  Food Insecurity: Not on file  Transportation Needs: Not on file  Physical Activity: Not on file  Stress: Not on file  Social Connections: Not on file    Family History: Family History  Problem Relation Age of Onset   Cancer Paternal Grandmother    Lupus Paternal Grandmother    Cancer Paternal Aunt     Allergies: No Known Allergies  Medications Prior to Admission  Medication Sig Dispense Refill Last Dose   acetaminophen (TYLENOL) 325 MG tablet Take 2 tablets (650 mg total) by mouth every 6 (six) hours as needed for up to 30 doses. 30 tablet 0 Past Week   aspirin EC 81 MG tablet Take 1 tablet (81 mg total) by mouth daily. Take  after 12 weeks for prevention of preeclampsia later in pregnancy 300 tablet 2 More than a month   Blood Pressure KIT 1 Device by Does not apply route once a week. To be monitored weekly from home 1 kit 0    cyclobenzaprine (FLEXERIL) 10 MG tablet Take 1 tablet (10 mg total) by mouth every 8 (eight) hours as needed for muscle spasms. 30 tablet 1    docusate sodium (COLACE) 100 MG capsule Take 1 capsule (100 mg total) by mouth 2 (two) times daily as needed for mild constipation or moderate constipation. 30 capsule 2    ferrous sulfate (FERROUSUL) 325 (65 FE) MG tablet Take 1 tablet (325 mg total) by mouth every other day. 30 tablet 3    nystatin cream (MYCOSTATIN) Apply 1 application topically 2 (two) times daily. 20 g 1    terconazole (TERAZOL 3) 0.8 % vaginal cream Place 1 applicator vaginally at bedtime. Apply nightly for three nights. 20 g 0      Review of Systems   All systems reviewed and negative except as stated in HPI  Blood pressure (!) 143/78, pulse (!)  102, temperature 98.4 F (36.9 C), temperature source Oral, resp. rate 20, height 5' 1"  (1.549 m), weight 86.9 kg, last menstrual period 11/07/2020, SpO2 100 %, unknown if currently breastfeeding. General appearance: alert Lungs: clear to auscultation bilaterally Heart: regular rate and rhythm Abdomen: soft, non-tender; bowel sounds normal Extremities: Homans sign is negative, no sign of DVT Presentation: cephalic confirmed by BSUS  Pt informed that the ultrasound is considered a limited OB ultrasound and is not intended to be a complete ultrasound exam.  Patient also informed that the ultrasound is not being completed with the intent of assessing for fetal or placental anomalies or any pelvic abnormalities.  Explained that the purpose of todays ultrasound is to assess for  presentation.  Patient acknowledges the purpose of the exam and the limitations of the study.    Fetal monitoringBaseline: 140 bpm, Variability: Good {> 6 bpm), Accelerations: Reactive, and Decelerations: Absent Uterine activity: irregular     Prenatal labs: ABO, Rh: O/Positive/-- (07/19 1502) Antibody: Negative (07/19 1502) Rubella: <0.90 (07/19 1502) RPR: Non Reactive (11/07 1526)  HBsAg: Negative (07/19 1502)  HIV: Non Reactive (11/07 1528)  GBS: Positive/-- (01/18 1356)  2 hr Glucola normal Genetic screening  LR female Anatomy US normal  Prenatal Transfer Tool  Maternal Diabetes: No Genetic Screening: Normal Maternal Ultrasounds/Referrals: Normal Fetal Ultrasounds or other Referrals:  None Maternal Substance Abuse:  No Significant Maternal Medications:  None Significant Maternal Lab Results: Group B Strep positive  No results found for this or any previous visit (from the past 24 hour(s)).  Patient Active Problem List   Diagnosis Date Noted   Left facial pain 08/01/2021   Chronic migraine w/o aura w/o status migrainosus, not intractable 08/01/2021   Group B Streptococcus carrier, +RV culture,  currently pregnant 07/22/2021   Anemia affecting pregnancy in second trimester 05/10/2021   Supervision of other normal pregnancy, antepartum 01/18/2021   Cervical ectropion 11/21/2018   Cervical dysplasia 09/04/2018   History of pospartum preeclampsia after antepartum GHTN in prior pregnancy, currently pregnant 05/10/2017   Rubella non-immune status, antepartum 01/22/2017    Assessment/Plan:  Nicole Mcmahon is a 26 y.o. G3P2002 at 15w3dhere for IOL for new onset gHTN  #Labor:Patient was 3 cm in office for last few weeks. Will admit for IOL for gHTN. Once settled in on L&D team will check cervix and start  IOL. Discussed IOL methods with patient.  #Pain: Plans epidural #FWB: Cat I #ID:  GBS pos>PCN #MOF: Breast #MOC:Undecided #Circ:  Yes  #gHTN BP in 140s. No symptoms. Admission preE labs sent.   #GBS pos PCN for ppx  Renard Matter, MD, MPH OB Fellow, Faculty [facility]

## 2021-08-10 NOTE — Progress Notes (Signed)
Labor Progress Note Makaylyn Sinyard is a 26 y.o. G3P2002 at [redacted]w[redacted]d who presented for IOL due to gHTN.   S: Doing well. Eating her dinner. FOB at bedside. Wondering about her options for induction. Hoping to avoid Pitocin and epidural if possible.   O:  BP 137/87 (BP Location: Right Arm)    Pulse 95    Temp 98.7 F (37.1 C) (Oral)    Resp 16    Ht 5\' 1"  (1.549 m)    Wt 86.9 kg    LMP 11/07/2020 (Exact Date)    SpO2 100%    BMI 36.18 kg/m   EFM: Baseline 135 bpm, moderate variabiliy, + accels, no decels  Toco: Occasional contractions  CVE: Dilation: 4 Effacement (%): 50 Station: -3 Presentation: Vertex Exam by:: Dr. [email or web address]   A&P: 26 y.o. 22 [redacted]w[redacted]d   #Labor: Progressing well. Now 4/50/-3 from 3/50/-3 in MAU. Discussed options for induction/augmentation. Offered AROM/membrane sweep. Discussed nipple stimulation. Reviewed indications for Pitocin and patient voiced understanding. Attempted AROM after verbal consent. Unable to rupture membranes at this time. Membrane sweep performed. Contractions now every 1-3 minutes. Encouraged ambulation and bouncing on ball as desired. Will plan to reassess in 4 hours. Will re-attempt AROM at that time as able.  #Pain: PRN, hoping to avoid epidural  #FWB: Cat 1  #GBS positive; PCN ordered   #Mild pre-eclampsia: Now meets criteria based on P:C ratio of 0.31. Multiple mild range BP measurements. No other symptoms at this time. Will continue to monitor closely.   07-09-1981, MD 10:43 PM

## 2021-08-10 NOTE — MAU Note (Signed)
Sent from OB office for BP evaluation.  Denies H/A, visual disturbances, and epigastric pain.  Endorses +FM.  Denies VB or LOF. 

## 2021-08-10 NOTE — Progress Notes (Signed)
° °  PRENATAL VISIT NOTE  Subjective:  Nicole Mcmahon is a 26 y.o. G3P2002 at [redacted]w[redacted]d being seen today for ongoing prenatal care.  She is currently monitored for the following issues for this low-risk pregnancy and has Rubella non-immune status, antepartum; History of pospartum preeclampsia after antepartum GHTN in prior pregnancy, currently pregnant; Cervical dysplasia; Cervical ectropion; Supervision of other normal pregnancy, antepartum; Anemia affecting pregnancy in second trimester; Group B Streptococcus carrier, +RV culture, currently pregnant; Left facial pain; and Chronic migraine w/o aura w/o status migrainosus, not intractable on their problem list.  Patient reports neck pain, reports mild headache earlier today.  Denies any visual symptoms, RUQ/epigastric pain or other concerning symptoms.  Contractions: Irregular. Vag. Bleeding: None.  Movement: Present. Denies leaking of fluid.   The following portions of the patient's history were reviewed and updated as appropriate: allergies, current medications, past family history, past medical history, past social history, past surgical history and problem list.   Objective:   Vitals:   08/10/21 1547 08/10/21 1551  BP: (!) 142/78 (!) 142/81  Pulse: (!) 109   Weight: 190 lb (86.2 kg)     Fetal Status: Fetal Heart Rate (bpm): 133   Movement: Present  Presentation: Vertex  General:  Alert, oriented and cooperative. Patient is in no acute distress.  Skin: Skin is warm and dry. No rash noted.   Cardiovascular: Normal heart rate noted  Respiratory: Normal respiratory effort, no problems with respiration noted  Abdomen: Soft, gravid, appropriate for gestational age.  Pain/Pressure: Present     Pelvic: Cervical exam performed in the presence of a chaperone Dilation: 3 Effacement (%): 50 Station: -3  Extremities: Normal range of motion.  Edema: Trace  Mental Status: Normal mood and affect. Normal behavior. Normal judgment and thought content.    Assessment and Plan:  Pregnancy: G3P2002 at [redacted]w[redacted]d 1. Elevated blood pressure affecting pregnancy in third trimester, antepartum Patient sent to [facility] for evaluation, high risk of needing induction for GHTN vs preeclampsia. She is resistant to this.   2. [redacted] weeks gestation of pregnancy 3. Group B Streptococcus carrier, +RV culture, currently pregnant Will need intrapartum prophylaxis.  4. Supervision of other normal pregnancy, antepartum Term labor symptoms and general obstetric precautions including but not limited to vaginal bleeding, contractions, leaking of fluid and fetal movement were reviewed in detail with the patient. Please refer to After Visit Summary for other counseling recommendations.   No follow-ups on file.  No future appointments.  Jaynie Collins, MD

## 2021-08-11 ENCOUNTER — Inpatient Hospital Stay (HOSPITAL_COMMUNITY): Payer: Medicaid Other | Admitting: Anesthesiology

## 2021-08-11 ENCOUNTER — Encounter (HOSPITAL_COMMUNITY): Payer: Self-pay | Admitting: Obstetrics and Gynecology

## 2021-08-11 DIAGNOSIS — D649 Anemia, unspecified: Secondary | ICD-10-CM | POA: Diagnosis not present

## 2021-08-11 DIAGNOSIS — O164 Unspecified maternal hypertension, complicating childbirth: Secondary | ICD-10-CM | POA: Diagnosis not present

## 2021-08-11 DIAGNOSIS — Z3A39 39 weeks gestation of pregnancy: Secondary | ICD-10-CM | POA: Diagnosis not present

## 2021-08-11 DIAGNOSIS — O9902 Anemia complicating childbirth: Secondary | ICD-10-CM | POA: Diagnosis not present

## 2021-08-11 LAB — CBC
HCT: 30.7 % — ABNORMAL LOW (ref 36.0–46.0)
HCT: 30.2 % — ABNORMAL LOW (ref 36.0–46.0)
Hemoglobin: 9.1 g/dL — ABNORMAL LOW (ref 12.0–15.0)
Hemoglobin: 9.2 g/dL — ABNORMAL LOW (ref 12.0–15.0)
MCH: 25.3 pg — ABNORMAL LOW (ref 26.0–34.0)
MCH: 25.7 pg — ABNORMAL LOW (ref 26.0–34.0)
MCHC: 29.6 g/dL — ABNORMAL LOW (ref 30.0–36.0)
MCHC: 30.5 g/dL (ref 30.0–36.0)
MCV: 85.3 fL (ref 80.0–100.0)
MCV: 84.4 fL (ref 80.0–100.0)
Platelets: 373 10*3/uL (ref 150–400)
Platelets: 385 10*3/uL (ref 150–400)
RBC: 3.6 MIL/uL — ABNORMAL LOW (ref 3.87–5.11)
RBC: 3.58 MIL/uL — ABNORMAL LOW (ref 3.87–5.11)
RDW: 15.4 % (ref 11.5–15.5)
RDW: 15.3 % (ref 11.5–15.5)
WBC: 16 10*3/uL — ABNORMAL HIGH (ref 4.0–10.5)
WBC: 20.1 10*3/uL — ABNORMAL HIGH (ref 4.0–10.5)
nRBC: 0 % (ref 0.0–0.2)
nRBC: 0 % (ref 0.0–0.2)

## 2021-08-11 LAB — RPR: RPR Ser Ql: NONREACTIVE

## 2021-08-11 MED ORDER — LACTATED RINGERS IV SOLN: 500.0000 mL | Freq: Once | INTRAVENOUS | Status: DC

## 2021-08-11 MED ORDER — FENTANYL CITRATE (PF) 100 MCG/2ML IJ SOLN
100.0000 ug | INTRAMUSCULAR | Status: DC | PRN
  Administered 2021-08-11: 100 ug via INTRAVENOUS
  Filled 2021-08-11: qty 2

## 2021-08-11 MED ORDER — FENTANYL-BUPIVACAINE-NACL 0.5-0.125-0.9 MG/250ML-% EP SOLN
EPIDURAL | Status: DC | PRN
  Administered 2021-08-11: 12 mL/h via EPIDURAL

## 2021-08-11 MED ORDER — DIPHENHYDRAMINE HCL 50 MG/ML IJ SOLN: 12.5000 mg | INTRAMUSCULAR | Status: DC | PRN

## 2021-08-11 MED ORDER — FENTANYL-BUPIVACAINE-NACL 0.5-0.125-0.9 MG/250ML-% EP SOLN
12.0000 mL/h | EPIDURAL | Status: DC | PRN
  Filled 2021-08-11: qty 250

## 2021-08-11 MED ORDER — TERBUTALINE SULFATE 1 MG/ML IJ SOLN: 0.2500 mg | Freq: Once | INTRAMUSCULAR | Status: DC | PRN

## 2021-08-11 MED ORDER — PHENYLEPHRINE 40 MCG/ML (10ML) SYRINGE FOR IV PUSH (FOR BLOOD PRESSURE SUPPORT): 80.0000 ug | PREFILLED_SYRINGE | INTRAVENOUS | Status: DC | PRN

## 2021-08-11 MED ORDER — EPHEDRINE 5 MG/ML INJ: 10.0000 mg | INTRAVENOUS | Status: DC | PRN

## 2021-08-11 MED ORDER — PHENYLEPHRINE 40 MCG/ML (10ML) SYRINGE FOR IV PUSH (FOR BLOOD PRESSURE SUPPORT)
80.0000 ug | PREFILLED_SYRINGE | INTRAVENOUS | Status: DC | PRN
  Filled 2021-08-11: qty 10

## 2021-08-11 MED ORDER — MISOPROSTOL 50MCG HALF TABLET
ORAL_TABLET | ORAL | Status: AC
  Administered 2021-08-11: 50 ug via BUCCAL
  Filled 2021-08-11: qty 1

## 2021-08-11 MED ORDER — LIDOCAINE HCL (PF) 1 % IJ SOLN
INTRAMUSCULAR | Status: DC | PRN
  Administered 2021-08-11: 5 mL via EPIDURAL

## 2021-08-11 MED ORDER — OXYTOCIN-SODIUM CHLORIDE 30-0.9 UT/500ML-% IV SOLN
1.0000 m[IU]/min | INTRAVENOUS | Status: DC
  Administered 2021-08-11: 2 m[IU]/min via INTRAVENOUS

## 2021-08-11 NOTE — Progress Notes (Signed)
Pt refuses repeat CBC. Explained to pt that she will have to wait on blood draw and results when requesting epidural. Pt verbalizes understanding.

## 2021-08-11 NOTE — Progress Notes (Signed)
LABOR PROGRESS NOTE  Nicole Mcmahon is a 26 y.o. G3P2002 at [redacted]w[redacted]d  admitted for IOL d/t preE w/o SF  Subjective: Pt just received epidural which is helping her pain. She is feeling pressure with and between contractions.   Objective: BP 118/73    Pulse (!) 158 Comment: pt shaking   Temp 98.2 F (36.8 C) (Oral)    Resp 16    Ht 5\' 1"  (1.549 m)    Wt 86.9 kg    LMP 11/07/2020 (Exact Date)    SpO2 98%    BMI 36.18 kg/m  or  Vitals:   08/11/21 2120 08/11/21 2121 08/11/21 2125 08/11/21 2127  BP:  136/70  118/73  Pulse:  94  (!) 158  Resp:      Temp:      TempSrc:      SpO2: 99%  98%   Weight:      Height:         Dilation: Lip/rim Effacement (%): 100 Cervical Position: Posterior Station: 0, -1 Presentation: Vertex Exam by:: Dr. [email or web address] FHT: baseline rate 135, moderate varibility, accels present, variable decels present with contractions with quick recovery to baseline Toco: ctx q 2 minutes  Labs: Lab Results  Component Value Date   WBC 20.1 (H) 08/11/2021   HGB 9.2 (L) 08/11/2021   HCT 30.2 (L) 08/11/2021   MCV 84.4 08/11/2021   PLT 385 08/11/2021    Patient Active Problem List   Diagnosis Date Noted   Encounter for supervision of high risk pregnancy in third trimester, antepartum 08/10/2021   Left facial pain 08/01/2021   Chronic migraine w/o aura w/o status migrainosus, not intractable 08/01/2021   Group B Streptococcus carrier, +RV culture, currently pregnant 07/22/2021   Anemia affecting pregnancy in second trimester 05/10/2021   Supervision of other normal pregnancy, antepartum 01/18/2021   Cervical ectropion 11/21/2018   Cervical dysplasia 09/04/2018   History of pospartum preeclampsia after antepartum GHTN in prior pregnancy, currently pregnant 05/10/2017   Rubella non-immune status, antepartum 01/22/2017    Assessment / Plan: 26 y.o. G3P2002 at [redacted]w[redacted]d here for IOL d/t preE w/o SF  Labor: Cervix is dilated with only a lip which could possibly be reduced.  Station is 0/-1. Pt agrees to labor down and use peanut to help improve station before pushing. Fetal Wellbeing:  cat 2 d/t recurrent variable decels with good reactivity, variability, and quick return to baseline between contractions Pain Control:  epidural  Anticipated MOD:  SVD  [redacted]w[redacted]d PGY1, family medicine resident  08/11/2021, 9:37 PM

## 2021-08-11 NOTE — Progress Notes (Signed)
Nicole Mcmahon is a 26 y.o. G3P2002 at [redacted]w[redacted]d by LMP admitted for induction of labor due to preE w/o SF.  Subjective: Pt reports she is doing well, ctx are frequent, coping with pain well.    Objective: Pt appears to be coping well and sitting on exercise ball breathing through ctxs. Husband Abbad remains at bedside.    BP (!) 155/84    Pulse 92    Temp 98 F (36.7 C) (Oral)    Resp 16    Ht 5\' 1"  (1.549 m)    Wt 86.9 kg    LMP 11/07/2020 (Exact Date)    SpO2 100%    BMI 36.18 kg/m  No intake/output data recorded. No intake/output data recorded.  FHT:  FHR: 150's bpm, variability: minimal ,  accelerations:  Present,  decelerations:  Absent UC:   irregular, toco adjusted SVE:   Dilation: 6 Effacement (%): 60 Station: -3 Exam by:: walker,cnm  Labs: Lab Results  Component Value Date   WBC 16.0 (H) 08/11/2021   HGB 9.1 (L) 08/11/2021   HCT 30.7 (L) 08/11/2021   MCV 85.3 08/11/2021   PLT 373 08/11/2021    Assessment / Plan: #IOL d/t preE w/o SF: Progressing well on Pitocin. Pitocin at   12 mu, continue titrate up per protocol.  Pt coping well.   #Preeclampsia:  mild range BP's 140/70's, 2 elevated BP's noted while at bedside, during contractions, labs stable and pt asymptomatic.  Will continue to monitor.  #Fetal Wellbeing:  Category I #Pain Control:  Labor support without medications.   I/D:   GBS: pos: continue PCN per protocol.  Anticipated MOD:  NSVD  Calub Tarnow 08/11/2021, 5:01 PM

## 2021-08-11 NOTE — Progress Notes (Signed)
Nicole Mcmahon is a 26 y.o. G3P2002 at [redacted]w[redacted]d by LMP admitted for induction of labor due to preE w/o SF.  Subjective: Pt reports she feels contractions frequently, but tolerable. Reports 1 episode of bloody show.  She is using birth ball, massage tools and ambulating.  Questions about the frequency of SVE.    Objective: Pt coming from bathroom, NAD, rocking on birth ball through contractions.  Appears to be coping well. Husband Abbad remains at bedside.    BP (!) 144/76    Pulse 96    Temp 98 F (36.7 C) (Oral)    Resp 16    Ht 5\' 1"  (1.549 m)    Wt 86.9 kg    LMP 11/07/2020 (Exact Date)    SpO2 100%    BMI 36.18 kg/m  No intake/output data recorded. No intake/output data recorded.  FHT:  FHR: 150's bpm, variability: minimal ,  accelerations:  Present,  decelerations:  Absent UC:   regular, every 1-3 minutes, palpates mild SVE:   Dilation: 4 Effacement (%): 60 Station: -3 Exam by:: walker,cnm  Labs: Lab Results  Component Value Date   WBC 16.0 (H) 08/11/2021   HGB 9.1 (L) 08/11/2021   HCT 30.7 (L) 08/11/2021   MCV 85.3 08/11/2021   PLT 373 08/11/2021    Assessment / Plan: #IOL d/t preE w/o SF: Pitocin at   10 mu.  Pt coping well. SVE deferred at this time, discussed plan with patient and she agrees, discussed limited SVE unless indicated to decrease risk of infection.  #Preeclampsia:  mild range BP's 110-140/60-70's: labs stable and pt asymptomatic.  Will continue to monitor.  #Fetal Wellbeing:  Category I #Pain Control:  Labor support without medications.   I/D:   GBS: pos: continue PCN per protocol.  Anticipated MOD:  NSVD  Valon Glasscock 08/11/2021, 3:17 PM

## 2021-08-11 NOTE — Anesthesia Procedure Notes (Signed)
Epidural Patient location during procedure: OB Start time: 08/11/2021 8:43 PM End time: 08/11/2021 8:56 PM  Staffing Anesthesiologist: Trevor Iha, MD Performed: anesthesiologist   Preanesthetic Checklist Completed: patient identified, IV checked, site marked, risks and benefits discussed, surgical consent, monitors and equipment checked, pre-op evaluation and timeout performed  Epidural Patient position: sitting Prep: DuraPrep and site prepped and draped Patient monitoring: continuous pulse ox and blood pressure Approach: midline Location: L3-L4 Injection technique: LOR air  Needle:  Needle type: Tuohy  Needle gauge: 17 G Needle length: 9 cm and 9 Needle insertion depth: 6 cm Catheter type: closed end flexible Catheter size: 19 Gauge Catheter at skin depth: 12 cm Test dose: negative  Assessment Events: blood not aspirated, injection not painful, no injection resistance, no paresthesia and negative IV test  Additional Notes Patient identified. Risks/Benefits/Options discussed with patient including but not limited to bleeding, infection, nerve damage, paralysis, failed block, incomplete pain control, headache, blood pressure changes, nausea, vomiting, reactions to medication both or allergic, itching and postpartum back pain. Confirmed with bedside nurse the patient's most recent platelet count. Confirmed with patient that they are not currently taking any anticoagulation, have any bleeding history or any family history of bleeding disorders. Patient expressed understanding and wished to proceed. All questions were answered. Sterile technique was used throughout the entire procedure. Please see nursing notes for vital signs. Test dose was given through epidural needle and negative prior to continuing to dose epidural or start infusion. Warning signs of high block given to the patient including shortness of breath, tingling/numbness in hands, complete motor block, or any concerning  symptoms with instructions to call for help. Patient was given instructions on fall risk and not to get out of bed. All questions and concerns addressed with instructions to call with any issues.  1 Attempt (S) . Patient tolerated procedure well.

## 2021-08-11 NOTE — Progress Notes (Signed)
Pt now agrees to get CBC, pt upset that there is no female anesthesia today for epidural. Pt request that husband remain in room, explained to pt and her husband the [facility] policy. They both verbalize understanding. Pt has poor attitude about the entire situation.

## 2021-08-11 NOTE — Progress Notes (Signed)
Doniesha Landau is a 26 y.o. G3P2002 at [redacted]w[redacted]d by LMP admitted for induction of labor due to preE w/o SF.  Subjective: Pt reports some anxious feelings around pitocin and ROM.  She has questions about pain management and would like to avoid epidural.   Objective: Pt sitting in bed, appears mildly anxious. Conversations and questions are appropriate.   She is accompanied by her husband Abbad.    BP (!) 142/88    Pulse 93    Temp 98 F (36.7 C) (Oral)    Resp 16    Ht 5\' 1"  (1.549 m)    Wt 86.9 kg    LMP 11/07/2020 (Exact Date)    SpO2 100%    BMI 36.18 kg/m  No intake/output data recorded. No intake/output data recorded.  FHT:  FHR: 130's bpm, variability: moderate,  accelerations:  Present,  decelerations:  Absent UC:   regular, every 1-3 minutes, palpates mild SVE:   Dilation: 4 Effacement (%): 60 Station: -3 Exam by:: walker,cnm  Labs: Lab Results  Component Value Date   WBC 15.5 (H) 08/10/2021   HGB 9.1 (L) 08/10/2021   HCT 29.7 (L) 08/10/2021   MCV 85.3 08/10/2021   PLT 395 08/10/2021    Assessment / Plan: #IOL d/t preE w/o SF: Did not rupture forebag at this time given fetal station, and this was explained to patient.  Discussed R/B/A to pitocin.  Pt gave verbal consent to start pitocin.  Pt ordered per low dose protocol, RN aware. Will continue to assess and plan to rupture forebag with next check if appropriate.  #Preeclampsia:  mild range BP's: labs stable and pt asymptomatic.  Will continue to monitor.  #Fetal Wellbeing:  Category I #Pain Control:  Labor support without medications. At this time pt is not interested epidural. Discussed R/B of IV pain medications and nitrous oxide.  I/D:   GBS: pos: continue PCN per protocol.  Anticipated MOD:  NSVD  Yailyn Strack 08/11/2021, 9:02 AM

## 2021-08-11 NOTE — Discharge Summary (Addendum)
Postpartum Discharge Summary      Patient Name: Nicole Mcmahon DOB: Apr 27, 1996 MRN: [id number]  Date of admission: 08/10/2021 Delivery date:08/12/2021  Delivering provider: Aletha Halim  Date of discharge: 08/13/2021  Admitting diagnosis: Encounter for supervision of high risk pregnancy in third trimester, antepartum [O09.93] Intrauterine pregnancy: [redacted]w[redacted]d    Secondary diagnosis:  Principal Problem:   Encounter for supervision of high risk pregnancy in third trimester, antepartum Active Problems:   Rubella non-immune status, antepartum   History of pospartum preeclampsia after antepartum GHTN in prior pregnancy, currently pregnant   Cervical dysplasia   Cervical ectropion   Supervision of other normal pregnancy, antepartum   Anemia affecting pregnancy in second trimester   Group B Streptococcus carrier, +RV culture, currently pregnant   Mild preeclampsia  Additional problems: none    Discharge diagnosis: Term Pregnancy Delivered and Preeclampsia (mild)                                              Post partum procedures: none Augmentation: AROM and Pitocin Complications: None  Hospital course: Induction of Labor With Vaginal Delivery   26y.o. yo [id number] admitted to the hospital 08/10/2021 for induction of labor.  Indication for induction: initially Gestational hypertension, however her urine P/C ratio elevated at 0.31 giving a dx of pre-e without SF. Patient had a labor course as follows: Membrane Rupture Time/Date: 7:26 AM ,08/11/2021   Delivery Method:Vaginal, Vacuum (Extractor)  d/t prolonged bradycardia Episiotomy: None  Lacerations:  None  Details of delivery can be found in separate delivery note.  Patient had a postpartum course remarkable for requiring Lasix and Procardia due to mild range BP elevations postpartum. Patient is discharged home 08/13/21 on lasix 20 mg for total of 5 days and Procardia 30 mg daily.   Newborn Data: Birth date:08/12/2021   Birth time:1:51 AM  Gender:Female  Living status:Living  Apgars:5 ,9  Weight:3680 g   Magnesium Sulfate received: No BMZ received: No Rhophylac:N/A MMEHM:CNOBSJGostpartum T-DaP: declined Flu: No Transfusion:No  Physical exam  Vitals:   08/12/21 1332 08/12/21 1734 08/12/21 1953 08/13/21 0531  BP: 129/82 128/72 (!) 134/93 134/89  Pulse:   93 100  Resp: 16 16 18 18   Temp: 98.4 F (36.9 C) 98.2 F (36.8 C)  98.4 F (36.9 C)  TempSrc: Oral Oral Oral Oral  SpO2: 100%  100% 100%  Weight:      Height:       General: alert, cooperative, and no distress Lochia: appropriate Uterine Fundus: firm Incision: N/A DVT Evaluation: No significant calf/ankle edema. Labs: Lab Results  Component Value Date   WBC 20.1 (H) 08/11/2021   HGB 9.2 (L) 08/11/2021   HCT 30.2 (L) 08/11/2021   MCV 84.4 08/11/2021   PLT 385 08/11/2021   CMP Latest Ref Rng & Units 08/10/2021  Glucose 70 - 99 mg/dL 79  BUN 6 - 20 mg/dL 5(L)  Creatinine 0.44 - 1.00 mg/dL 0.56  Sodium 135 - 145 mmol/L 134(L)  Potassium 3.5 - 5.1 mmol/L 3.6  Chloride 98 - 111 mmol/L 106  CO2 22 - 32 mmol/L 19(L)  Calcium 8.9 - 10.3 mg/dL 9.0  Total Protein 6.5 - 8.1 g/dL 6.6  Total Bilirubin 0.3 - 1.2 mg/dL 0.6  Alkaline Phos 38 - 126 U/L 152(H)  AST 15 - 41 U/L 19  ALT 0 -  44 U/L 11   Edinburgh Score: Edinburgh Postnatal Depression Scale Screening Tool 08/13/2021  I have been able to laugh and see the funny side of things. 0  I have looked forward with enjoyment to things. 0  I have blamed myself unnecessarily when things went wrong. 1  I have been anxious or worried for no good reason. 1  I have felt scared or panicky for no good reason. 1  Things have been getting on top of me. 0  I have been so unhappy that I have had difficulty sleeping. 0  I have felt sad or miserable. 1  I have been so unhappy that I have been crying. 1  The thought of harming myself has occurred to me. 0  Edinburgh Postnatal Depression Scale  Total 5     After visit meds:  Allergies as of 08/13/2021   No Known Allergies      Medication List     STOP taking these medications    aspirin EC 81 MG tablet   Blood Pressure Kit   cyclobenzaprine 10 MG tablet Commonly known as: FLEXERIL   docusate sodium 100 MG capsule Commonly known as: COLACE   ferrous sulfate 325 (65 FE) MG tablet Commonly known as: FerrouSul   nystatin cream Commonly known as: MYCOSTATIN   terconazole 0.8 % vaginal cream Commonly known as: TERAZOL 3       TAKE these medications    acetaminophen 325 MG tablet Commonly known as: Tylenol Take 2 tablets (650 mg total) by mouth every 6 (six) hours as needed (for pain scale < 4). What changed: reasons to take this   furosemide 20 MG tablet Commonly known as: LASIX Take 1 tablet (20 mg total) by mouth daily.   ibuprofen 600 MG tablet Commonly known as: ADVIL Take 1 tablet (600 mg total) by mouth every 6 (six) hours.   NIFEdipine 30 MG 24 hr tablet Commonly known as: ADALAT CC Take 1 tablet (30 mg total) by mouth daily.         Discharge home in stable condition Infant Feeding: Breast Infant Disposition:home with mother Discharge instruction: per After Visit Summary and Postpartum booklet. Activity: Advance as tolerated. Pelvic rest for 6 weeks.  Diet: routine diet Future Appointments:No future appointments. Follow up Visit:   Please schedule this patient for a In person postpartum visit in 4 weeks with the following provider: Any provider. Additional Postpartum F/U:BP check 1 week  Low risk pregnancy complicated by: HTN Delivery mode:  Vaginal, Vacuum (Extractor)  Anticipated Birth Control:  Unsure   08/13/2021 Precious Gilding, DO  CNM attestation I have seen and examined this patient and agree with above documentation in the resident's note.   Keilynn Marano is a 26 y.o. G3P3003 s/p VAVD.   Pain is well controlled.  Plan for birth control is  unsure .  Method of Feeding:  breast  PE:  BP 134/89 (BP Location: Left Arm)    Pulse 100    Temp 98.4 F (36.9 C) (Oral)    Resp 18    Ht 5' 1"  (1.549 m)    Wt 86.9 kg    LMP 11/07/2020 (Exact Date)    SpO2 100%    Breastfeeding Unknown    BMI 36.18 kg/m  Fundus firm  No results for input(s): HGB, HCT in the last 72 hours.   Plan: discharge today - postpartum care discussed - f/u clinic in 1 week for BP check; then in 6 weeks for postpartum visit  Myrtis Ser, CNM 08/13/2021

## 2021-08-11 NOTE — Anesthesia Preprocedure Evaluation (Signed)
Anesthesia Evaluation  Patient identified by MRN, date of birth, ID band Patient awake    Reviewed: Allergy & Precautions, NPO status , Patient's Chart, lab work & pertinent test results  Airway Mallampati: II  TM Distance: >3 FB Neck ROM: Full    Dental no notable dental hx. (+) Teeth Intact, Dental Advisory Given   Pulmonary asthma ,    Pulmonary exam normal breath sounds clear to auscultation       Cardiovascular hypertension, Normal cardiovascular exam Rhythm:Regular Rate:Normal     Neuro/Psych negative neurological ROS     GI/Hepatic negative GI ROS, Neg liver ROS,   Endo/Other  negative endocrine ROS  Renal/GU negative Renal ROS     Musculoskeletal   Abdominal (+) + obese (BMI 36.18),   Peds  Hematology  (+) Blood dyscrasia, anemia , Lab Results      Component                Value               Date                      WBC                      20.1 (H)            08/11/2021                HGB                      9.2 (L)             08/11/2021                HCT                      30.2 (L)            08/11/2021                MCV                      84.4                08/11/2021                PLT                      385                 08/11/2021              Anesthesia Other Findings   Reproductive/Obstetrics (+) Pregnancy                             Anesthesia Physical Anesthesia Plan  ASA: 3  Anesthesia Plan: Epidural   Post-op Pain Management:    Induction:   PONV Risk Score and Plan:   Airway Management Planned:   Additional Equipment:   Intra-op Plan:   Post-operative Plan:   Informed Consent: I have reviewed the patients History and Physical, chart, labs and discussed the procedure including the risks, benefits and alternatives for the proposed anesthesia with the patient or authorized representative who has indicated his/her understanding and  acceptance.       Plan Discussed with:   Anesthesia Plan Comments: (39.4  wk G3P2 w gHtn for LEA)       Anesthesia Quick Evaluation

## 2021-08-12 ENCOUNTER — Encounter (HOSPITAL_COMMUNITY): Payer: Self-pay | Admitting: Obstetrics and Gynecology

## 2021-08-12 ENCOUNTER — Other Ambulatory Visit (HOSPITAL_COMMUNITY): Payer: Self-pay

## 2021-08-12 DIAGNOSIS — Z3A39 39 weeks gestation of pregnancy: Secondary | ICD-10-CM | POA: Diagnosis not present

## 2021-08-12 DIAGNOSIS — O0993 Supervision of high risk pregnancy, unspecified, third trimester: Secondary | ICD-10-CM | POA: Diagnosis not present

## 2021-08-12 DIAGNOSIS — O99820 Streptococcus B carrier state complicating pregnancy: Secondary | ICD-10-CM | POA: Diagnosis not present

## 2021-08-12 DIAGNOSIS — O1404 Mild to moderate pre-eclampsia, complicating childbirth: Secondary | ICD-10-CM | POA: Diagnosis not present

## 2021-08-12 DIAGNOSIS — O1400 Mild to moderate pre-eclampsia, unspecified trimester: Secondary | ICD-10-CM | POA: Diagnosis present

## 2021-08-12 MED ORDER — FERROUS SULFATE 325 (65 FE) MG PO TABS
325.0000 mg | ORAL_TABLET | ORAL | Status: DC
  Administered 2021-08-12: 325 mg via ORAL
  Filled 2021-08-12: qty 1

## 2021-08-12 MED ORDER — MEASLES, MUMPS & RUBELLA VAC IJ SOLR: 0.5000 mL | Freq: Once | INTRAMUSCULAR | Status: DC

## 2021-08-12 MED ORDER — IBUPROFEN 600 MG PO TABS
600.0000 mg | ORAL_TABLET | Freq: Four times a day (QID) | ORAL | Status: DC
  Administered 2021-08-12 – 2021-08-13 (×6): 600 mg via ORAL
  Filled 2021-08-12 (×6): qty 1

## 2021-08-12 MED ORDER — ACETAMINOPHEN 325 MG PO TABS: 650.0000 mg | ORAL_TABLET | ORAL | Status: DC | PRN

## 2021-08-12 MED ORDER — ONDANSETRON HCL 4 MG/2ML IJ SOLN: 4.0000 mg | INTRAMUSCULAR | Status: DC | PRN

## 2021-08-12 MED ORDER — ONDANSETRON HCL 4 MG PO TABS: 4.0000 mg | ORAL_TABLET | ORAL | Status: DC | PRN

## 2021-08-12 MED ORDER — COCONUT OIL OIL: 1.0000 "application " | TOPICAL_OIL | Status: DC | PRN

## 2021-08-12 MED ORDER — DIBUCAINE (PERIANAL) 1 % EX OINT: 1.0000 "application " | TOPICAL_OINTMENT | CUTANEOUS | Status: DC | PRN

## 2021-08-12 MED ORDER — TETANUS-DIPHTH-ACELL PERTUSSIS 5-2.5-18.5 LF-MCG/0.5 IM SUSY: 0.5000 mL | PREFILLED_SYRINGE | Freq: Once | INTRAMUSCULAR | Status: DC

## 2021-08-12 MED ORDER — NIFEDIPINE ER OSMOTIC RELEASE 30 MG PO TB24
30.0000 mg | ORAL_TABLET | Freq: Every day | ORAL | Status: DC
  Administered 2021-08-12 – 2021-08-13 (×2): 30 mg via ORAL
  Filled 2021-08-12 (×2): qty 1

## 2021-08-12 MED ORDER — IBUPROFEN 600 MG PO TABS
600.0000 mg | ORAL_TABLET | Freq: Four times a day (QID) | ORAL | 0 refills | Status: DC
  Filled 2021-08-12: qty 30, 8d supply, fill #0

## 2021-08-12 MED ORDER — BENZOCAINE-MENTHOL 20-0.5 % EX AERO: 1.0000 "application " | INHALATION_SPRAY | CUTANEOUS | Status: DC | PRN

## 2021-08-12 MED ORDER — SIMETHICONE 80 MG PO CHEW: 80.0000 mg | CHEWABLE_TABLET | ORAL | Status: DC | PRN

## 2021-08-12 MED ORDER — SENNOSIDES-DOCUSATE SODIUM 8.6-50 MG PO TABS
2.0000 | ORAL_TABLET | Freq: Every day | ORAL | Status: DC
  Administered 2021-08-13: 2 via ORAL
  Filled 2021-08-12: qty 2

## 2021-08-12 MED ORDER — NIFEDIPINE ER 30 MG PO TB24
30.0000 mg | ORAL_TABLET | Freq: Every day | ORAL | 0 refills | Status: DC
  Filled 2021-08-12: qty 90, 90d supply, fill #0

## 2021-08-12 MED ORDER — WITCH HAZEL-GLYCERIN EX PADS: 1.0000 "application " | MEDICATED_PAD | CUTANEOUS | Status: DC | PRN

## 2021-08-12 MED ORDER — FUROSEMIDE 20 MG PO TABS
20.0000 mg | ORAL_TABLET | Freq: Every day | ORAL | 0 refills | Status: DC
  Filled 2021-08-12: qty 3, 3d supply, fill #0

## 2021-08-12 MED ORDER — FUROSEMIDE 20 MG PO TABS
20.0000 mg | ORAL_TABLET | Freq: Every day | ORAL | Status: DC
  Administered 2021-08-12 – 2021-08-13 (×2): 20 mg via ORAL
  Filled 2021-08-12 (×2): qty 1

## 2021-08-12 MED ORDER — DIPHENHYDRAMINE HCL 25 MG PO CAPS: 25.0000 mg | ORAL_CAPSULE | Freq: Four times a day (QID) | ORAL | Status: DC | PRN

## 2021-08-12 MED ORDER — MEDROXYPROGESTERONE ACETATE 150 MG/ML IM SUSP: 150.0000 mg | INTRAMUSCULAR | Status: DC | PRN

## 2021-08-12 MED ORDER — PRENATAL MULTIVITAMIN CH
1.0000 | ORAL_TABLET | Freq: Every day | ORAL | Status: DC
  Administered 2021-08-12 – 2021-08-13 (×2): 1 via ORAL
  Filled 2021-08-12 (×2): qty 1

## 2021-08-12 NOTE — Progress Notes (Signed)
Patient Vitals for the past 4 hrs:  BP Temp Temp src Pulse SpO2  08/12/21 0101 130/63 -- -- (!) 127 --  08/12/21 0031 (!) 108/58 -- -- (!) 127 --  08/12/21 0003 (!) 108/55 -- -- (!) 122 --  08/11/21 2331 128/60 -- -- (!) 105 --  08/11/21 2301 (!) 126/55 99 F (37.2 C) Oral (!) 125 --  08/11/21 2231 133/67 -- -- (!) 116 --  08/11/21 2201 134/75 -- -- (!) 105 --  08/11/21 2200 -- -- -- -- 100 %  08/11/21 2127 118/73 -- -- (!) 158 --  08/11/21 2125 -- -- -- -- 98 %   Pt has had persistent ant lip (12-1 o'clock) despite position changes.  Now w/much more pressure, vtx at +2.  FHR Cat 1/2 (occ variable decel, moderate variability, + accels).  Lip reduces easily.  Will continue to push.

## 2021-08-12 NOTE — Lactation Note (Signed)
This note was copied from a baby's chart. Lactation Consultation Note  Patient Name: Nicole Mcmahon [id number] Date: 08/12/2021 Reason for consult: Initial assessment;Mother's request;Term;Breastfeeding assistance;Other (Comment) (PIH Lasix and Nifedipine) Age:26 hours  Infant not voided or stool, still under 24 hrs. Mom latching in cradle but volume with hand expression shooting out with infant clamping on nipple to control flow.  LC assisted with cross cradle prone latch with signs of milk transfer. Infant staying on with depth to breast and not on the nipple.  Mom shown how to hand express and give colostrum on spoon to offer more volume if infant not latching.   Plan 1. To feed based on cues 8-12x 24hr period. Mom to offer breasts and look for signs of milk transfer.  2. If unable to latch offer EBM on spoon with hand expression.  3. I and O sheet reviewed.  All questions answered at the end of the visit.  Mom feeding plan to EBF and not to offer any bottles.  Mom using pacifier, LC alerted can impact latch and hold of use until pass 4 weeks.   Maternal Data Has patient been taught Hand Expression?: Yes Does the patient have breastfeeding experience prior to this delivery?: Yes How long did the patient breastfeed?: 1 year and 2 years  Feeding Mother's Current Feeding Choice: Breast Milk  LATCH Score Latch: Repeated attempts needed to sustain latch, nipple held in mouth throughout feeding, stimulation needed to elicit sucking reflex.  Audible Swallowing: Spontaneous and intermittent  Type of Nipple: Everted at rest and after stimulation  Comfort (Breast/Nipple): Soft / non-tender  Hold (Positioning): Assistance needed to correctly position infant at breast and maintain latch.  LATCH Score: 8   Lactation Tools Discussed/Used    Interventions Interventions: Breast feeding basics reviewed;Support pillows;Education;Assisted with latch;Position options;Skin to  skin;Expressed Teacher, music;Infant Driven Feeding Algorithm education;Hand express;Breast compression;Adjust position  Discharge [facility]: No  Consult Status Consult Status: Follow-up Date: 08/13/21 Follow-up type: In-patient    Shiheem Corporan  Nicholson-Springer 08/12/2021, 11:29 PM

## 2021-08-12 NOTE — Anesthesia Postprocedure Evaluation (Signed)
Anesthesia Post Note  Patient: Anasia Agro  Procedure(s) Performed: AN AD HOC LABOR EPIDURAL     Patient location during evaluation: Mother Baby Anesthesia Type: Epidural Level of consciousness: awake and alert and oriented Pain management: satisfactory to patient Vital Signs Assessment: post-procedure vital signs reviewed and stable Respiratory status: respiratory function stable Cardiovascular status: stable Postop Assessment: no headache, no backache, epidural receding, patient able to bend at knees, no signs of nausea or vomiting, adequate PO intake and able to ambulate Anesthetic complications: no   No notable events documented.  Last Vitals:  Vitals:   08/12/21 0935 08/12/21 1332  BP: 120/82 129/82  Pulse: 86   Resp: 16 16  Temp: 37.1 C 36.9 C  SpO2: 99% 100%    Last Pain:  Vitals:   08/12/21 1332  TempSrc: Oral  PainSc: 0-No pain   Pain Goal:                   Sameeha Rockefeller

## 2021-08-12 NOTE — Progress Notes (Signed)
° °  S:Went to see pt d/t greater than 3 elevated blood pressures since delivery. Pt denies any headache, blurry vision, RUQ pain, or dizziness. She agrees to start Procardia at this time.   O: BP (!) 146/85 (BP Location: Left Arm)    Pulse 94    Temp 97.7 F (36.5 C) (Oral)    Resp 18    Ht 5\' 1"  (1.549 m)    Wt 86.9 kg    LMP 11/07/2020 (Exact Date)    SpO2 99%    Breastfeeding Unknown    BMI 36.18 kg/m     A/P: gHTN with elevated pressures after delivery. Will start procardia 30 mg daily and enroll pt in babyscripts. Pt will follow up for BP check in one week.  Precious Gilding, DO 08/12/2021, 7:53 AM PGY-1

## 2021-08-13 MED ORDER — ACETAMINOPHEN 325 MG PO TABS: 650.0000 mg | ORAL_TABLET | Freq: Four times a day (QID) | ORAL | Status: DC | PRN

## 2021-08-13 NOTE — Lactation Note (Signed)
This note was copied from a baby's chart. Lactation Consultation Note  Patient Name: Nicole Mcmahon [id number] Date: 08/13/2021 Reason for consult: Follow-up assessment Age:26 hours  P3 mother whose infant is now 46 hours old.  This is a term baby at 39+5 weeks.  Mother has breast feeding experience of one and two years with her other two children.  Her current feeding preference is breast.  Baby was asleep in her arms when I arrived.  Mother had no questions/concerns related to breast feeding.  Last LATCH score was an 8; multiple voids/stools.  Mother has our OP phone number for any concerns after discharge.  Baby has been discharged; awaiting father to return with the car seat.    Maternal Data    Feeding    LATCH Score                    Lactation Tools Discussed/Used    Interventions    Discharge Discharge Education: Engorgement and breast care  Consult Status Consult Status: Complete Date: 08/13/21 Follow-up type: Call as needed    Nicole Mcmahon Nicole Mcmahon 08/13/2021, 11:51 AM

## 2021-08-15 ENCOUNTER — Telehealth: Payer: Self-pay

## 2021-08-15 NOTE — Telephone Encounter (Signed)
Transition Care Management Unsuccessful Follow-up Telephone Call  Date of discharge and from where:  08/13/2021-[facility]   Attempts:  1st Attempt  Reason for unsuccessful TCM follow-up call:  Unable to reach patient

## 2021-08-16 NOTE — Telephone Encounter (Signed)
Transition Care Management Unsuccessful Follow-up Telephone Call  Date of discharge and from where:  08/13/2021 from [facility]  Attempts:  2nd Attempt  Reason for unsuccessful TCM follow-up call:  Unable to reach patient

## 2021-08-17 ENCOUNTER — Ambulatory Visit: Payer: Medicaid Other

## 2021-08-17 ENCOUNTER — Encounter: Payer: Self-pay | Admitting: Family Medicine

## 2021-08-17 NOTE — Telephone Encounter (Signed)
Transition Care Management Unsuccessful Follow-up Telephone Call  Date of discharge and from where:  08/13/2021 - [place]  Attempts:  3rd Attempt  Reason for unsuccessful TCM follow-up call:  Unable to reach patient

## 2021-08-18 ENCOUNTER — Ambulatory Visit: Payer: Medicaid Other

## 2021-08-22 ENCOUNTER — Telehealth (HOSPITAL_COMMUNITY): Payer: Self-pay | Admitting: *Deleted

## 2021-08-22 NOTE — Telephone Encounter (Signed)
Patient voiced no questions or concerns at this time. Patient declined EPDS - requested RN call back tomorrow. Stated, "Actually now is not a good time." Patient voiced no questions or concerns regarding infant at this time. Patient reports infant sleeps in a bassinet on his back. RN reviewed ABCs of safe sleep. Patient verbalized understanding. Erline Levine, RN,  08/22/21, [phone]

## 2021-08-23 ENCOUNTER — Telehealth (HOSPITAL_COMMUNITY): Payer: Self-pay

## 2021-08-23 NOTE — Telephone Encounter (Signed)
Calling patient back to do EPDS. Patient did no answer phone call. Voicemail left.   Sharyn Lull [facility] 08/23/2021,1358

## 2021-08-24 ENCOUNTER — Encounter: Payer: Medicaid Other | Admitting: Family Medicine

## 2021-09-12 ENCOUNTER — Ambulatory Visit: Payer: Medicaid Other | Admitting: Family Medicine

## 2021-09-26 ENCOUNTER — Encounter: Payer: Self-pay | Admitting: Obstetrics and Gynecology

## 2021-09-26 ENCOUNTER — Telehealth (INDEPENDENT_AMBULATORY_CARE_PROVIDER_SITE_OTHER): Payer: Medicaid Other | Admitting: Obstetrics and Gynecology

## 2021-09-26 VITALS — BP 142/60 | HR 60

## 2021-09-26 DIAGNOSIS — O135 Gestational [pregnancy-induced] hypertension without significant proteinuria, complicating the puerperium: Secondary | ICD-10-CM

## 2021-09-26 HISTORY — DX: Gestational (pregnancy-induced) hypertension without significant proteinuria, complicating the puerperium: O13.5

## 2021-09-26 NOTE — Progress Notes (Addendum)
? ? ? ? ?TELEHEALTH POSTPARTUM VISIT ENCOUNTER NOTE ? ?Provider location: [facility] [employer] at [facility] ? ?Patient location: Home ? ?Appointment Date: 09/26/2021 ? ?I connected with [name] on 09/26/21 at  1:30 PM EDT by telephone and verified that I am speaking with the correct person using two identifiers. Patient was unable to do MyChart audiovisual encounter due to technical difficulties, she tried several times.  ?  ?I discussed the limitations, risks, security and privacy concerns of performing an evaluation and management service by telephone and the availability of in person appointments. I also discussed with the patient that there may be a patient responsible charge related to this service. The patient expressed understanding and agreed to proceed. ? ?Chief Complaint:  Postpartum Visit ?Please provide an addendum to the note DOS 09-26-21 to include if visit was telephone or video, location of both patient and provider and the telehealth statement. If telephone please add time spent with patient on phone. If visit is not telehealth, please state In person visit. ? ?History of Present Illness:  ?Nicole Mcmahon is a 26 y.o. G3P3003 s/p 2/10 VAVD/intact perineum after 39wk IOL for mild pre-eclampsia.  I have fully reviewed the prenatal and intrapartum course. The delivery was at 39.5 gestational weeks.  Anesthesia: epidural. Postpartum course has been uncomplicated. Baby is doing well. Baby is feeding by breast . Bowel function is normal.. Bleeding no bleeding. Bladder function is normal. Patient is not sexually active. Contraception method is abstinence. . Postpartum depression screening: positive. ? ?Review of Systems: as noted in the History of Present Illness. ? ?Patient Active Problem List  ? Diagnosis Date Noted  ? Transient hypertension during pregnancy, postpartum 09/26/2021  ? Mild preeclampsia 08/12/2021  ? Encounter for supervision of high risk pregnancy in third trimester,  antepartum 08/10/2021  ? Left facial pain 08/01/2021  ? Chronic migraine w/o aura w/o status migrainosus, not intractable 08/01/2021  ? Group B Streptococcus carrier, +RV culture, currently pregnant 07/22/2021  ? Anemia affecting pregnancy in second trimester 05/10/2021  ? Supervision of other normal pregnancy, antepartum 01/18/2021  ? Cervical ectropion 11/21/2018  ? Cervical dysplasia 09/04/2018  ? History of pospartum preeclampsia after antepartum GHTN in prior pregnancy, currently pregnant 05/10/2017  ? Rubella non-immune status, antepartum 01/22/2017  ? ? ?Medications ?[name] had no medications administered during this visit. ?Current Outpatient Medications  ?Medication Sig Dispense Refill  ? NIFEdipine (ADALAT CC) 30 MG 24 hr tablet Take 1 tablet (30 mg total) by mouth daily. 90 tablet 0  ? acetaminophen (TYLENOL) 325 MG tablet Take 2 tablets (650 mg total) by mouth every 6 (six) hours as needed (for pain scale < 4). (Patient not taking: Reported on 09/26/2021)    ? ferrous sulfate (FERROUSUL) 325 (65 FE) MG tablet Take 1 tablet (325 mg total) by mouth every other day. (Patient not taking: Reported on 09/26/2021) 30 tablet 3  ? furosemide (LASIX) 20 MG tablet Take 1 tablet (20 mg total) by mouth daily. (Patient not taking: Reported on 09/26/2021) 3 tablet 0  ? ibuprofen (ADVIL) 600 MG tablet Take 1 tablet (600 mg total) by mouth every 6 (six) hours. (Patient not taking: Reported on 09/26/2021) 30 tablet 0  ? ?No current facility-administered medications for this visit.  ? ? ?Allergies ?Patient has no known allergies. ? ?Physical Exam:  ?General:  Alert, oriented and cooperative.   ?Mental Status: Normal mood and affect perceived. Normal judgment and thought content.  ?Rest of physical exam deferred due to  type of encounter ? ?PP Depression Screening:   ? Edinburgh Postnatal Depression Scale - 09/26/21 1330   ? ?  ? Edinburgh Postnatal Depression Scale:  In the Past 7 Days  ? I have been able to laugh and  see the funny side of things. 0   ? I have looked forward with enjoyment to things. 0   ? I have blamed myself unnecessarily when things went wrong. 0   ? I have been anxious or worried for no good reason. 3   ? I have felt scared or panicky for no good reason. 0   ? Things have been getting on top of me. 2   ? I have been so unhappy that I have had difficulty sleeping. 2   ? I have felt sad or miserable. 2   ? I have been so unhappy that I have been crying. 0   ? The thought of harming myself has occurred to me. 0   ? Edinburgh Postnatal Depression Scale Total 9   ? ?  ?  ? ?  ? ? ?Assessment: Patient stable ? ?Plan: ?1. History of mild pre-eclampsia ?Continue on procardia xl 30 qday. RN BP check later this week. If stable, then f/u with one month RN BP check to see if can d/c procardia at that visit Pre-eclampsia precautions given  ? ?2. Postpartum care and examination ?She states she has good support at home and resources available.  ?Pt told let us know if she would like to do anything for birth control ? ?3. Abnormal pap ?Needs repeat at one year (July 2023). D/w pt that can do annual visit then, too. ? ?RTC 1wk for RN BP check. 1 month for RN BP check (to see if can come off med). 5 month annual exam.  ? ?I discussed the assessment and treatment plan with the patient. The patient was provided an opportunity to ask questions and all were answered. The patient agreed with the plan and demonstrated an understanding of the instructions. ?  ?The patient was advised to call back or seek an in-person evaluation/go to the ED if the symptoms worsen or if the condition fails to improve as anticipated. ? ?I provided 15 minutes of non-face-to-face time during this encounter. ? ? ?[place] Bing, MD ?[facility] [employer], [facility] ? ?  ? ? ? ?  ?

## 2021-10-01 ENCOUNTER — Encounter: Payer: Self-pay | Admitting: Obstetrics & Gynecology

## 2021-10-01 DIAGNOSIS — Z30011 Encounter for initial prescription of contraceptive pills: Secondary | ICD-10-CM

## 2021-10-03 ENCOUNTER — Telehealth: Payer: Medicaid Other | Admitting: Family Medicine

## 2021-10-03 MED ORDER — SLYND 4 MG PO TABS: 1.0000 | ORAL_TABLET | Freq: Every day | ORAL | 14 refills | Status: DC

## 2021-10-31 ENCOUNTER — Ambulatory Visit: Payer: Medicaid Other | Admitting: Family Medicine

## 2021-11-03 IMAGING — US US OB LIMITED
1 series · 9 of 9 positions shown · non-contrast
Comparison: none

[Series 1: us ob limited · 9 acquisitions, 9 frames shown]
[im 1/9]
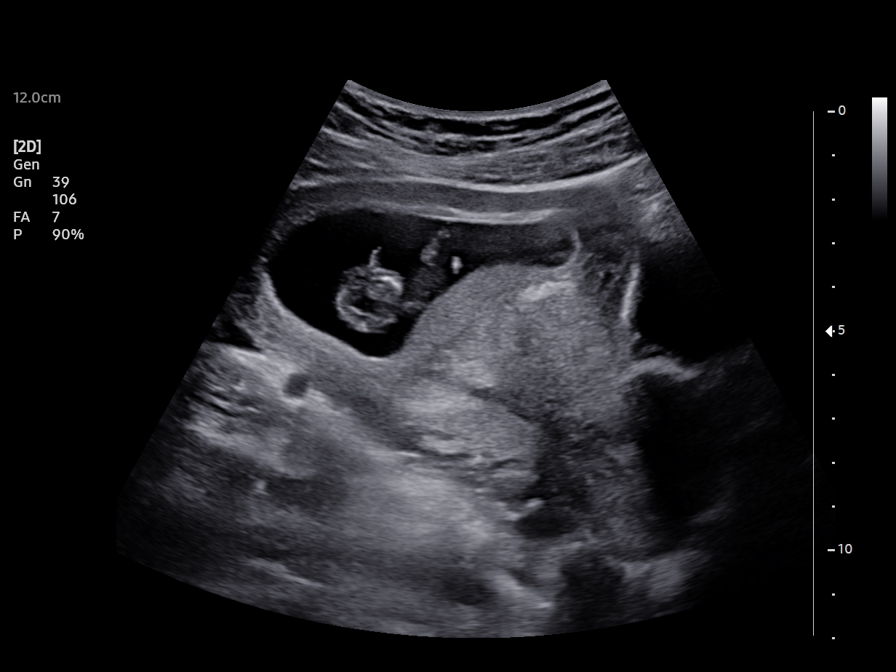
[im 2/9]
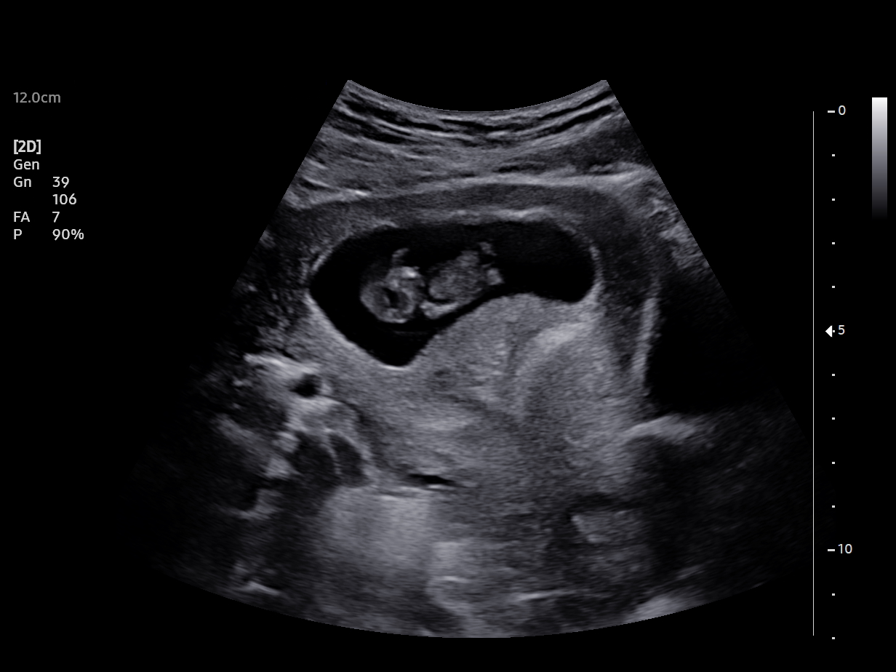
[im 3/9]
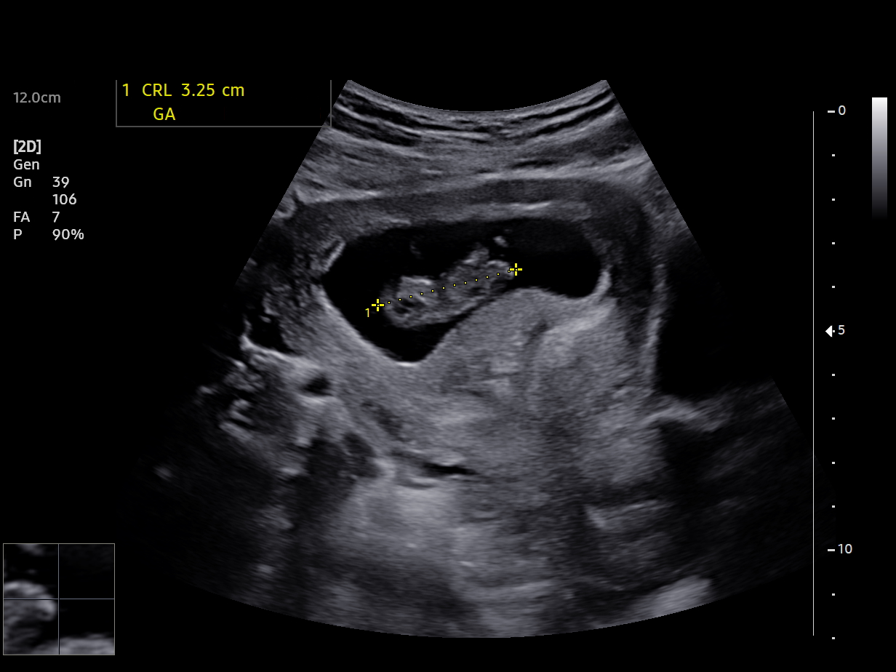
[im 4/9]
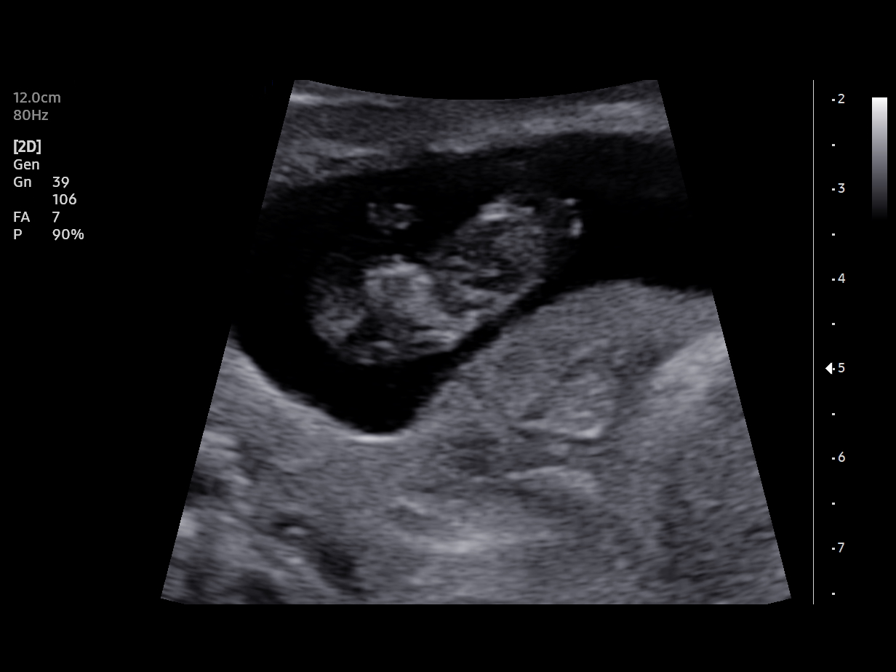
[im 5/9]
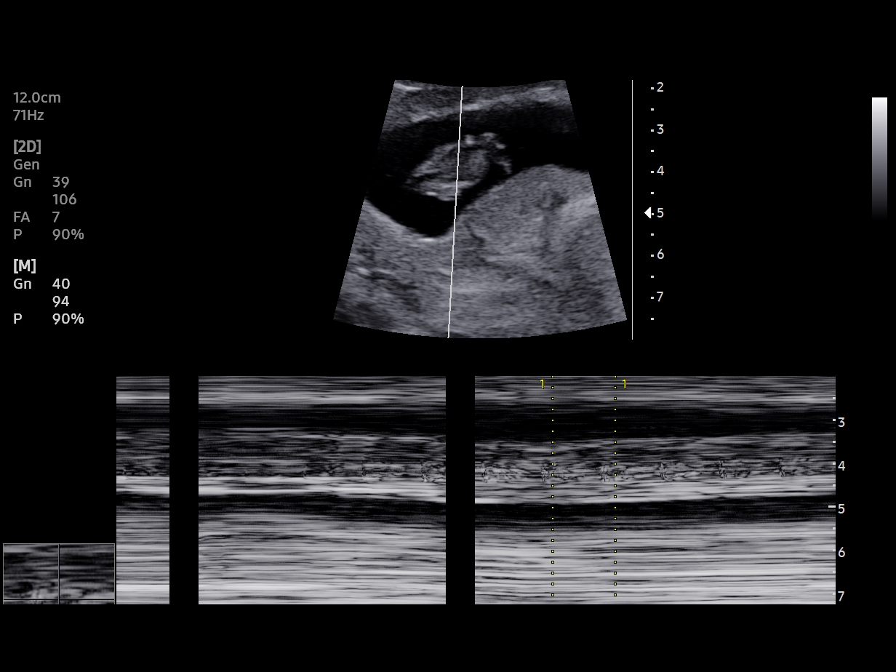
[im 6/9]
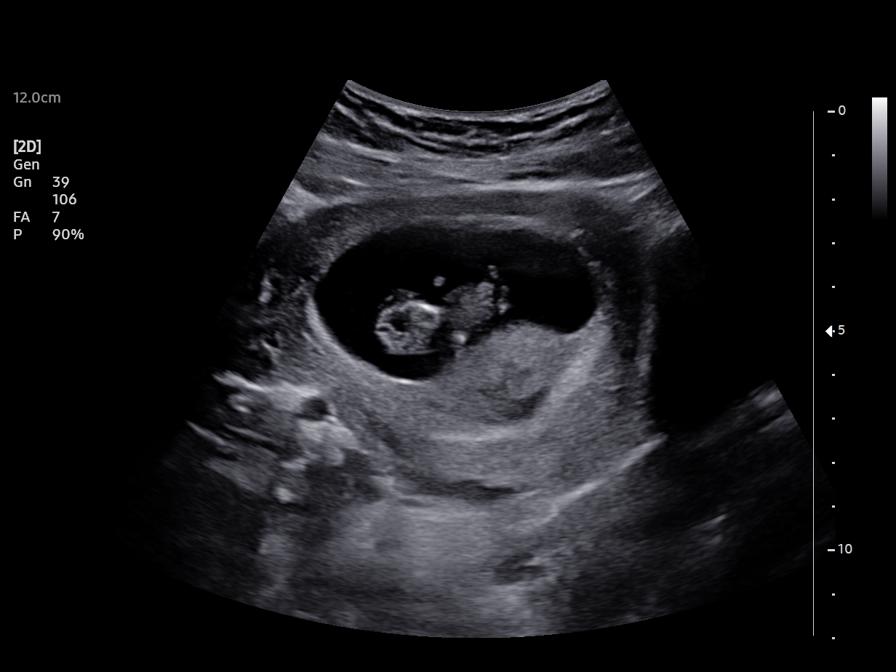
[im 7/9]
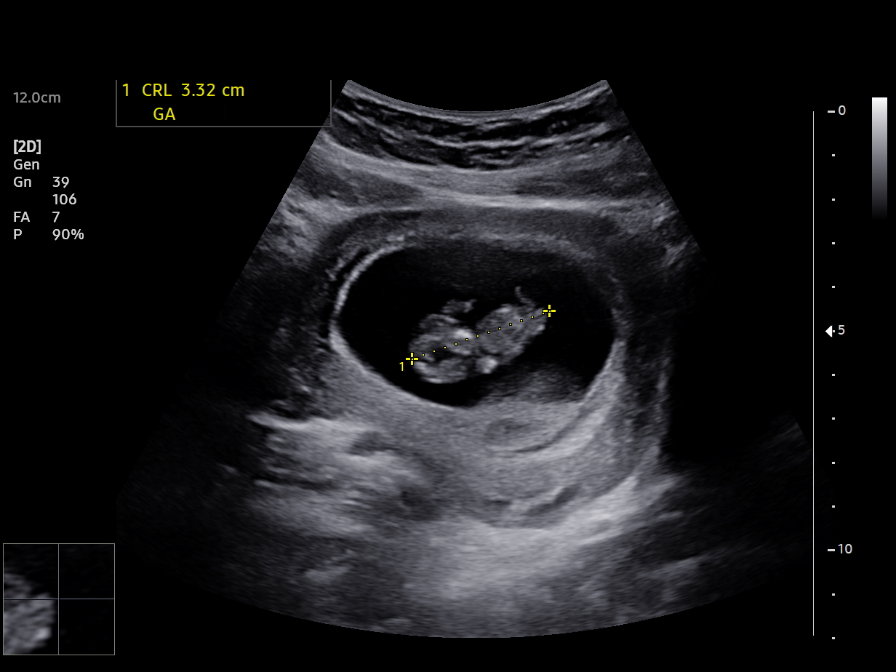
[im 8/9]
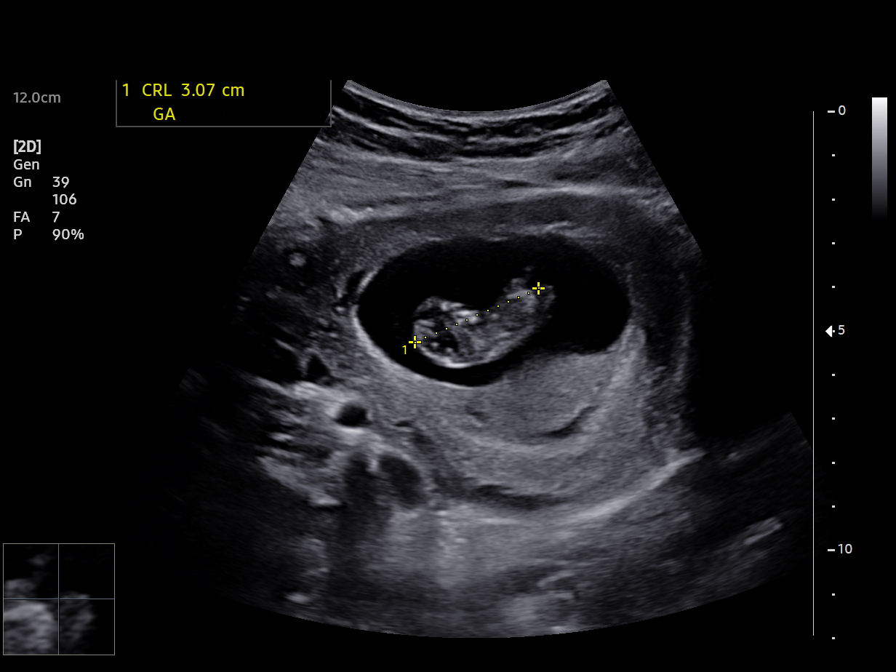
[im 9/9]
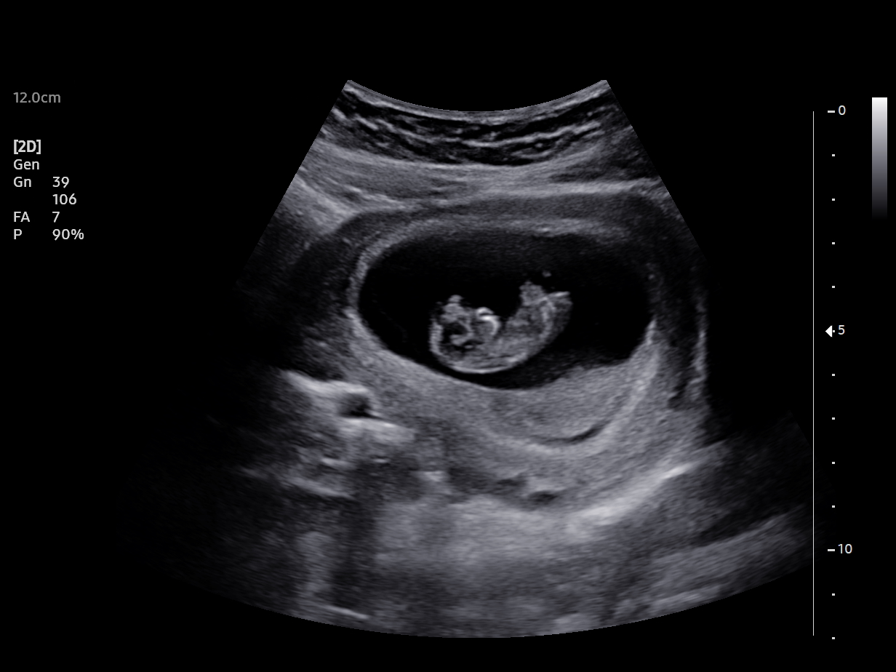

[9 of 9 positions shown; findings below may reference images not displayed]

[facility]
                                                            [facility]
                                                            [place]

 [address]  [HOSPITAL]                         [id number]     KYRAN VIERA

Indications

 10 weeks gestation of pregnancy
Fetal Evaluation

 Num Of Fetuses:         1
 Fetal Heart Rate(bpm):  164
 Cardiac Activity:       Observed
Biometry

 CRL:      30.7  mm     G. Age:  9w 6d                   EDD:   08/17/21
OB History

 Gravidity:    3         Term:   2
 Living:       2
Gestational Age

 LMP:           10w 2d        Date:  11/07/20                 EDD:   08/14/21
 Best:          10w 2d     Det. By:  LMP  (11/07/20)          EDD:   08/14/21
Comments

 Single live IUP at 9.6 weeks gestation by CRL. Dates
 consistent with LMP provided.
Impression

 Single live intrauterine pregnancy, suggest EDD as above.
Recommendations

 Patient to get prenatal care as recommended.
              Zinic, Lu Prepelica Opuzen

## 2021-11-07 ENCOUNTER — Encounter: Payer: Self-pay | Admitting: Family Medicine

## 2021-11-07 ENCOUNTER — Ambulatory Visit (INDEPENDENT_AMBULATORY_CARE_PROVIDER_SITE_OTHER): Payer: Medicaid Other | Admitting: Family Medicine

## 2021-11-07 ENCOUNTER — Other Ambulatory Visit (HOSPITAL_COMMUNITY)
Admission: RE | Admit: 2021-11-07 | Discharge: 2021-11-07 | Disposition: A | Discharge status: Home / Self Care | Payer: Medicaid Other | Source: Ambulatory Visit | Attending: Family Medicine | Admitting: Family Medicine

## 2021-11-07 VITALS — BP 129/77 | HR 99 | Wt 153.0 lb

## 2021-11-07 DIAGNOSIS — B9689 Other specified bacterial agents as the cause of diseases classified elsewhere: Secondary | ICD-10-CM | POA: Diagnosis not present

## 2021-11-07 DIAGNOSIS — Z7251 High risk heterosexual behavior: Secondary | ICD-10-CM | POA: Diagnosis not present

## 2021-11-07 DIAGNOSIS — B3731 Acute candidiasis of vulva and vagina: Secondary | ICD-10-CM | POA: Diagnosis not present

## 2021-11-07 DIAGNOSIS — N760 Acute vaginitis: Secondary | ICD-10-CM | POA: Diagnosis not present

## 2021-11-07 NOTE — Progress Notes (Signed)
GYN pt here for a problem visit.  ? ?CC: vaginal irritation and bumps that are painful x 2 days now.  ? ?Pt states cycle was due today , yet has not came on.  ? ? ? ? ?

## 2021-11-07 NOTE — Progress Notes (Signed)
? ?  GYNECOLOGY PROBLEM  VISIT ENCOUNTER NOTE ? ?Subjective:  ? Nicole Mcmahon is a 26 y.o. G52P3003 female here for a problem GYN visit.  Current complaints: vaginal itching. Started yesterday. She has tried vaginal yeast cream but it has not helped. She reports associated vaginal pain.  ? ?She took Plan B on 4/1 and 4/2, has not taken any since her LMP 4/8 ? ?Unprotected sex last night ? ?She had a cycle in April and expected her cycle this week but has not started her menses yet.  ? ?Denies abnormal vaginal bleeding, discharge, pelvic pain, problems with intercourse or other gynecologic concerns.  ?  ?Gynecologic History ?Patient's last menstrual period was 10/08/2021 (exact date). ? ?Contraception: none ? ?Health Maintenance Due  ?Topic Date Due  ? COVID-19 Vaccine (1) Never done  ? HPV VACCINES (1 - 2-dose series) Never done  ? ? ?The following portions of the patient's history were reviewed and updated as appropriate: allergies, current medications, past family history, past medical history, past social history, past surgical history and problem list. ? ?Review of Systems ?Pertinent items are noted in HPI. ?  ?Objective:  ?BP 129/77   Pulse 99   Wt 153 lb (69.4 kg)   LMP 10/08/2021 (Exact Date)   Breastfeeding Yes   BMI 28.91 kg/m?  ?Gen: well appearing, NAD ?HEENT: no scleral icterus ?CV: RR ?Lung: Normal WOB ?Ext: warm well perfused ? ?PELVIC: Cream present on labia/perineum. Wiped away and no erythema.Normal appearing external genitalia; normal appearing vaginal mucosa and cervix.  Copious thin frothy discharge. no uterine or adnexal tenderness. ? ? ?Assessment and Plan:  ? ?1. Unprotected sex ?Gave Slynd pill pack and discussed how to take medication ?Reviewed risk of pregnancy with continued unprotected sex  ?UPT today was negative ?Patient reports barrier to method use is the risk of prolonged bleeding which means she is unable to pray with menstrual bleeding.  ?Reviewed starting slynd and then  knowing that after 1 month the bleeding will be more predictable. ?Reviewed safety of slynd with breastfeeding ?- POCT urine pregnancy ? ?2. Acute vaginitis ?Suspect BV ?- Cervicovaginal ancillary only ? ? ?Please refer to After Visit Summary for other counseling recommendations.  ? ?No follow-ups on file. ? ?Caren Macadam, MD, MPH, ABFM ?Attending Physician ?[place] for [place] ? ?

## 2021-11-09 LAB — CERVICOVAGINAL ANCILLARY ONLY
Bacterial Vaginitis (gardnerella): POSITIVE — AB
Candida Glabrata: NEGATIVE
Candida Vaginitis: POSITIVE — AB
Chlamydia: NEGATIVE
Comment: NEGATIVE
Comment: NEGATIVE
Comment: NEGATIVE
Comment: NEGATIVE
Comment: NEGATIVE
Comment: NORMAL
Neisseria Gonorrhea: NEGATIVE
Trichomonas: NEGATIVE

## 2021-11-11 MED ORDER — METRONIDAZOLE 500 MG PO TABS: 500.0000 mg | ORAL_TABLET | Freq: Two times a day (BID) | ORAL | 0 refills | Status: DC

## 2021-11-11 MED ORDER — FLUCONAZOLE 150 MG PO TABS: 150.0000 mg | ORAL_TABLET | Freq: Once | ORAL | 0 refills | Status: AC

## 2022-01-31 ENCOUNTER — Other Ambulatory Visit: Payer: Self-pay | Admitting: *Deleted

## 2022-01-31 DIAGNOSIS — B9689 Other specified bacterial agents as the cause of diseases classified elsewhere: Secondary | ICD-10-CM

## 2022-01-31 MED ORDER — METRONIDAZOLE 500 MG PO TABS: 500.0000 mg | ORAL_TABLET | Freq: Two times a day (BID) | ORAL | 0 refills | Status: DC

## 2022-01-31 MED ORDER — FLUCONAZOLE 150 MG PO TABS: 150.0000 mg | ORAL_TABLET | Freq: Once | ORAL | 0 refills | Status: AC

## 2022-03-07 ENCOUNTER — Other Ambulatory Visit: Payer: Self-pay

## 2022-03-07 DIAGNOSIS — Z30011 Encounter for initial prescription of contraceptive pills: Secondary | ICD-10-CM

## 2022-03-07 MED ORDER — SLYND 4 MG PO TABS: 1.0000 | ORAL_TABLET | Freq: Every day | ORAL | 14 refills | Status: DC

## 2022-03-07 NOTE — Progress Notes (Signed)
Pt states Rx was sent to wrong pharmacy. Rx resent to [facility] on [address].

## 2022-04-04 ENCOUNTER — Other Ambulatory Visit: Payer: Self-pay | Admitting: Family Medicine

## 2022-04-04 DIAGNOSIS — Z30011 Encounter for initial prescription of contraceptive pills: Secondary | ICD-10-CM

## 2022-04-05 ENCOUNTER — Other Ambulatory Visit: Payer: Self-pay | Admitting: Family Medicine

## 2022-04-05 DIAGNOSIS — Z30011 Encounter for initial prescription of contraceptive pills: Secondary | ICD-10-CM

## 2022-04-07 ENCOUNTER — Encounter: Payer: Self-pay | Admitting: Obstetrics and Gynecology

## 2022-07-31 ENCOUNTER — Ambulatory Visit (INDEPENDENT_AMBULATORY_CARE_PROVIDER_SITE_OTHER): Payer: Medicaid Other

## 2022-07-31 VITALS — BP 118/75 | HR 81

## 2022-07-31 DIAGNOSIS — N644 Mastodynia: Secondary | ICD-10-CM

## 2022-07-31 DIAGNOSIS — Z3202 Encounter for pregnancy test, result negative: Secondary | ICD-10-CM | POA: Diagnosis not present

## 2022-07-31 LAB — POCT URINE PREGNANCY: Preg Test, Ur: NEGATIVE

## 2022-07-31 NOTE — Progress Notes (Signed)
Nicole Mcmahon here for a UPT. Pt had a negative upt at home. LMP is 07/17/2022.   Pt states last period was longer than usual, notes HA's and Breast tenderness pt worried she may be pregnant notes missing scheduled birth control pills on more than one occasion.   UPT in office Negative.    Reviewed medications and informed to start a PNV, if not already. Pt to follow up in  2 wks

## 2022-08-15 ENCOUNTER — Encounter: Payer: Self-pay | Admitting: Family Medicine

## 2022-08-16 ENCOUNTER — Ambulatory Visit: Payer: Medicaid Other

## 2022-08-17 ENCOUNTER — Encounter: Payer: Self-pay | Admitting: Obstetrics and Gynecology

## 2022-08-21 ENCOUNTER — Other Ambulatory Visit (HOSPITAL_COMMUNITY)
Admission: RE | Admit: 2022-08-21 | Discharge: 2022-08-21 | Disposition: A | Discharge status: Home / Self Care | Payer: Medicaid Other | Source: Ambulatory Visit | Attending: Obstetrics and Gynecology | Admitting: Obstetrics and Gynecology

## 2022-08-21 ENCOUNTER — Ambulatory Visit (INDEPENDENT_AMBULATORY_CARE_PROVIDER_SITE_OTHER): Payer: Medicaid Other | Admitting: Obstetrics and Gynecology

## 2022-08-21 VITALS — BP 128/84 | HR 78 | Wt 132.0 lb

## 2022-08-21 DIAGNOSIS — N760 Acute vaginitis: Secondary | ICD-10-CM

## 2022-08-21 DIAGNOSIS — N879 Dysplasia of cervix uteri, unspecified: Secondary | ICD-10-CM

## 2022-08-21 DIAGNOSIS — N912 Amenorrhea, unspecified: Secondary | ICD-10-CM | POA: Diagnosis not present

## 2022-08-21 DIAGNOSIS — R519 Headache, unspecified: Secondary | ICD-10-CM

## 2022-08-21 DIAGNOSIS — N761 Subacute and chronic vaginitis: Secondary | ICD-10-CM | POA: Insufficient documentation

## 2022-08-21 DIAGNOSIS — B9689 Other specified bacterial agents as the cause of diseases classified elsewhere: Secondary | ICD-10-CM

## 2022-08-21 DIAGNOSIS — Z1329 Encounter for screening for other suspected endocrine disorder: Secondary | ICD-10-CM | POA: Diagnosis not present

## 2022-08-21 MED ORDER — BUTALBITAL-APAP-CAFFEINE 50-325-40 MG PO CAPS: 1.0000 | ORAL_CAPSULE | Freq: Once | ORAL | 0 refills | Status: DC | PRN

## 2022-08-21 NOTE — Progress Notes (Unsigned)
Obstetrics and Gynecology Visit Return Patient Evaluation  Appointment Date: 08/21/2022  Primary Care Provider: [place], Kimberly Valle for [facility]   Chief Complaint: HA and missed period on [place]  History of Present Illness:  Cindia Fregoso is a 27 y.o. with above CC.  HAs started about a week ago, frontal, non radiating, no prior history of, she notices it in the morning when she wakes up, no aggravating or alleviating s/s, no trauma, and it comes and goes during the day, no neuro deficits and no concordance with taking the progestin only pill. +vaginitis  Patient didn't have a period on the placebo days on the [facility]. She took a UPT here on 1/29 and it was negative. She is still breastfeeding.  Review of Systems:  as noted in the History of Present Illness.  Patient Active Problem List   Diagnosis Date Noted   Transient hypertension during pregnancy, postpartum 09/26/2021   Mild preeclampsia 08/12/2021   Left facial pain 08/01/2021   Chronic migraine w/o aura w/o status migrainosus, not intractable 08/01/2021   Cervical ectropion 11/21/2018   Cervical dysplasia 09/04/2018   History of pospartum preeclampsia after antepartum GHTN in prior pregnancy, currently pregnant 05/10/2017   Rubella non-immune status, antepartum 01/22/2017   Medications:  Analycia Stiverson had no medications administered during this visit. Current Outpatient Medications  Medication Sig Dispense Refill   Drospirenone (SLYND) 4 MG TABS Take 1 tablet by mouth daily. 28 tablet 14   No current facility-administered medications for this visit.    Allergies: has No Known Allergies.  Physical Exam:  BP 128/84   Pulse 78   Wt 132 lb (59.9 kg)   LMP 07/17/2022 (Exact Date)   BMI 24.94 kg/m  Body mass index is 24.94 kg/m. General appearance: Well nourished, well developed female in no acute distress.  Neuro/Psych:  Normal mood and affect.  CN 2-12 grossly intact,  EOMI, PERRL, normal strength and sensation throughout, normal gait, 2+ brachial and patellar   Assessment: patient stable  Plan:  1. Subacute vaginitis Self swab done today - Cervicovaginal ancillary only( [place])  2. Amenorrhea I d/w her that this is not uncommon with OCPs, especially when breastfeeding - POCT urine pregnancy  3. Cervical dysplasia Pt due for pap. She declines exam today  4. Nonintractable headache, unspecified chronicity pattern, unspecified headache type Check basic labs. Pt amenable to trial of a one time dose of fioricet. She does not have a pcp. If HA continues, can refer to HA clinic.  - TSH Rfx on Abnormal to Free T4 - Beta hCG quant (ref lab) - CBC - Comprehensive metabolic panel - Magnesium   RTC: PRN. Pt asked to make appt for pap sometime soon  Durene Romans MD Attending Center for [employer] [facility])

## 2022-08-21 NOTE — Progress Notes (Unsigned)
RGYN here to discuss symptoms and BC.  CC: HA's and vaginal itching, no period    Pt has tried OTC Excedrin no relief.    Pt prefers to do self swab today.

## 2022-08-22 ENCOUNTER — Encounter: Payer: Self-pay | Admitting: Obstetrics and Gynecology

## 2022-08-22 LAB — COMPREHENSIVE METABOLIC PANEL
ALT: 22 IU/L (ref 0–32)
AST: 17 IU/L (ref 0–40)
Albumin/Globulin Ratio: 1.9 (ref 1.2–2.2)
Albumin: 4.9 g/dL (ref 4.0–5.0)
Alkaline Phosphatase: 90 IU/L (ref 44–121)
BUN/Creatinine Ratio: 12 (ref 9–23)
BUN: 8 mg/dL (ref 6–20)
Bilirubin Total: 0.6 mg/dL (ref 0.0–1.2)
CO2: 18 mmol/L — ABNORMAL LOW (ref 20–29)
Calcium: 9.7 mg/dL (ref 8.7–10.2)
Chloride: 103 mmol/L (ref 96–106)
Creatinine, Ser: 0.69 mg/dL (ref 0.57–1.00)
Globulin, Total: 2.6 g/dL (ref 1.5–4.5)
Glucose: 74 mg/dL (ref 70–99)
Potassium: 4.3 mmol/L (ref 3.5–5.2)
Sodium: 139 mmol/L (ref 134–144)
Total Protein: 7.5 g/dL (ref 6.0–8.5)
eGFR: 123 mL/min/{1.73_m2} (ref >59)

## 2022-08-22 LAB — TSH RFX ON ABNORMAL TO FREE T4: TSH: 4.37 u[IU]/mL (ref 0.450–4.500)

## 2022-08-22 LAB — CBC
Hematocrit: 36.7 % (ref 34.0–46.6)
Hemoglobin: 12.4 g/dL (ref 11.1–15.9)
MCH: 30.4 pg (ref 26.6–33.0)
MCHC: 33.8 g/dL (ref 31.5–35.7)
MCV: 90 fL (ref 79–97)
Platelets: 404 10*3/uL (ref 150–450)
RBC: 4.08 x10E6/uL (ref 3.77–5.28)
RDW: 11.6 % — ABNORMAL LOW (ref 11.7–15.4)
WBC: 10 10*3/uL (ref 3.4–10.8)

## 2022-08-22 LAB — MAGNESIUM: Magnesium: 1.9 mg/dL (ref 1.6–2.3)

## 2022-08-22 LAB — BETA HCG QUANT (REF LAB): hCG Quant: <1 m[IU]/mL

## 2022-08-22 LAB — POCT URINE PREGNANCY: Preg Test, Ur: NEGATIVE

## 2022-08-23 LAB — CERVICOVAGINAL ANCILLARY ONLY
Bacterial Vaginitis (gardnerella): POSITIVE — AB
Candida Glabrata: NEGATIVE
Candida Vaginitis: POSITIVE — AB
Chlamydia: NEGATIVE
Comment: NEGATIVE
Comment: NEGATIVE
Comment: NEGATIVE
Comment: NEGATIVE
Comment: NEGATIVE
Comment: NORMAL
Neisseria Gonorrhea: NEGATIVE
Trichomonas: NEGATIVE

## 2022-08-24 MED ORDER — METRONIDAZOLE 500 MG PO TABS: 500.0000 mg | ORAL_TABLET | Freq: Two times a day (BID) | ORAL | 0 refills | Status: DC

## 2022-08-24 MED ORDER — FLUCONAZOLE 150 MG PO TABS: 150.0000 mg | ORAL_TABLET | Freq: Once | ORAL | 0 refills | Status: AC

## 2022-08-24 NOTE — Addendum Note (Signed)
Addended by: Aletha Halim on: 08/24/2022 02:50 PM   Modules accepted: Orders

## 2022-08-31 ENCOUNTER — Ambulatory Visit: Payer: Medicaid Other | Admitting: Obstetrics and Gynecology

## 2022-09-01 ENCOUNTER — Telehealth: Payer: Medicaid Other | Admitting: Nurse Practitioner

## 2022-09-01 DIAGNOSIS — G4489 Other headache syndrome: Secondary | ICD-10-CM

## 2022-09-01 NOTE — Patient Instructions (Signed)
[email or web address]

## 2022-09-01 NOTE — Progress Notes (Signed)
Virtual Visit Consent   Fable Mure, you are scheduled for a virtual visit with a [facility] provider today. Just as with appointments in the office, your consent must be obtained to participate. Your consent will be active for this visit and any virtual visit you may have with one of our providers in the next 365 days. If you have a MyChart account, a copy of this consent can be sent to you electronically.  As this is a virtual visit, video technology does not allow for your provider to perform a traditional examination. This may limit your provider's ability to fully assess your condition. If your provider identifies any concerns that need to be evaluated in person or the need to arrange testing (such as labs, EKG, etc.), we will make arrangements to do so. Although advances in technology are sophisticated, we cannot ensure that it will always work on either your end or our end. If the connection with a video visit is poor, the visit may have to be switched to a telephone visit. With either a video or telephone visit, we are not always able to ensure that we have a secure connection.  By engaging in this virtual visit, you consent to the provision of healthcare and authorize for your insurance to be billed (if applicable) for the services provided during this visit. Depending on your insurance coverage, you may receive a charge related to this service.  I need to obtain your verbal consent now. Are you willing to proceed with your visit today? Nicole Mcmahon has provided verbal consent on 09/01/2022 for a virtual visit (video or telephone). Apolonio Schneiders, FNP  Date: 09/01/2022 7:07 PM  Virtual Visit via Video Note   I, Apolonio Schneiders, connected with  Nicole Mcmahon  ([id number], March 01, 1999) on 09/01/22 at  7:15 PM EST by a video-enabled telemedicine application and verified that I am speaking with the correct person using two identifiers.  Location: Patient: Virtual Visit Location Patient:  Home Provider: Virtual Visit Location Provider: Home Office   I discussed the limitations of evaluation and management by telemedicine and the availability of in person appointments. The patient expressed understanding and agreed to proceed.    History of Present Illness: Nicole Mcmahon is a 27 y.o. who identifies as a female who was assigned female at birth, and is being seen today for headaches.  She has been suffering from worsening headaches for the past week  The headaches will sometimes start in the morning and will come and go throughout the day  She has been using Excedrin for relief   She went to see her OBGYN this week to assure that she is not pregnant  Vital signs were taken during that visit- no evidence of HTN She did have transient HTN during her most recent pregnancy but that was one year ago   Was advised to stop taking OCP by OB due to headaches, she has now been off for 4 days without relief from headaches. She has been on a birth control for the past year, HA only worsening over the past week.   She did have a moment of blurred vision today and at times the words on the TV appear "jumbled"  She has pain all over her head it is not isolated to one side or one region   She was sick recently with URI symptoms and one side of her nasal passage was blocked, those symptoms have resolved. She was not tested for COVID at that time  She  also had a stomach virus this week and was vomiting on that day only for 24 hours and those symptoms have now resolved no fever associated with that illness. Denies HA related to N/V  She denies any shoulder tension or pain at base of head or neck. Denies pain with ROM of neck/head   She has been noted to have migraines in the past and is being referred to a Neurologist and is scheduled in April   At her most recent visit she was given a two tablet trial of Fioricet and that did provide relief for her   Problems:  Patient Active Problem  List   Diagnosis Date Noted   Transient hypertension during pregnancy, postpartum 09/26/2021   Mild preeclampsia 08/12/2021   Left facial pain 08/01/2021   Chronic migraine w/o aura w/o status migrainosus, not intractable 08/01/2021   Cervical ectropion 11/21/2018   Cervical dysplasia 09/04/2018   History of pospartum preeclampsia after antepartum GHTN in prior pregnancy, currently pregnant 05/10/2017   Rubella non-immune status, antepartum 01/22/2017    Allergies: No Known Allergies Medications:  Current Outpatient Medications:    Butalbital-APAP-Caffeine 50-325-40 MG capsule, Take 1 capsule by mouth once as needed for up to 1 dose for headache., Disp: 2 capsule, Rfl: 0   Drospirenone (SLYND) 4 MG TABS, Take 1 tablet by mouth daily., Disp: 28 tablet, Rfl: 14   metroNIDAZOLE (FLAGYL) 500 MG tablet, Take 1 tablet (500 mg total) by mouth 2 (two) times daily., Disp: 14 tablet, Rfl: 0  Observations/Objective: Patient is well-developed, well-nourished in no acute distress.  Resting comfortably  at home.  Head is normocephalic, atraumatic.  No labored breathing.  Speech is clear and coherent with logical content.  Patient is alert and oriented at baseline.    Assessment and Plan: 1. Other headache syndrome     Discussed inability to refill Butalbital-APAP-Caffeine  at this time. Restricted over Telehealth services   Since that has provided the most relief for her she may follow up with her PCP or visit UC while awaiting visit with neurology to explore refill options     Symptom presentation is mixed with new onset headaches not consisted with past migraines.   Discussed with patient options available through [facility], patient opted to visit UC over weekend to explore refill options on current medications  Sent link to schedule through Mychart   Follow Up Instructions: I discussed the assessment and treatment plan with the patient. The patient was provided an opportunity to ask questions  and all were answered. The patient agreed with the plan and demonstrated an understanding of the instructions.  A copy of instructions were sent to the patient via MyChart unless otherwise noted below.    The patient was advised to call back or seek an in-person evaluation if the symptoms worsen or if the condition fails to improve as anticipated.  Time:  I spent 20 minutes with the patient via telehealth technology discussing the above problems/concerns.    Apolonio Schneiders, FNP

## 2022-09-02 ENCOUNTER — Ambulatory Visit
Admission: RE | Admit: 2022-09-02 | Discharge: 2022-09-02 | Disposition: A | Discharge status: Home / Self Care | Payer: Medicaid Other | Source: Ambulatory Visit | Attending: Urgent Care | Admitting: Urgent Care

## 2022-09-02 VITALS — BP 108/70 | HR 78 | Temp 98.4°F | Resp 16

## 2022-09-02 DIAGNOSIS — R519 Headache, unspecified: Secondary | ICD-10-CM | POA: Diagnosis not present

## 2022-09-02 DIAGNOSIS — G479 Sleep disorder, unspecified: Secondary | ICD-10-CM

## 2022-09-02 MED ORDER — IBUPROFEN 600 MG PO TABS: 600.0000 mg | ORAL_TABLET | Freq: Three times a day (TID) | ORAL | 0 refills | Status: DC | PRN

## 2022-09-02 NOTE — ED Triage Notes (Signed)
Pt c/o intermittent HA x 1 week-denies at present-tele visit yesterday with no rx-NAD-steady gait

## 2022-09-02 NOTE — ED Provider Notes (Signed)
[facility]  Note:  This document was prepared using Systems analyst and may include unintentional dictation errors.  MRN: [id number] DOB: 1995-09-01  Subjective:   Nicole Mcmahon is a 27 y.o. female presenting for 1 week history of persistent headache all over, can have associated lightheadedness, dizziness, fatigue. No double vision, confusion, neck pain, fever, neck stiffness, scotomas, photophobias, sinus congestion, sinus drainage, throat pain, cough. Has a history of migraines, feels that these are different. Has been using Excedrin, has been trying to hydrate well. Patient has a 27 year old, sleep has been erratic, sleeps 5-6 hours and is interrupted sleep throughout the night due to sleeping. She was doing intermittent fasting in December. Has been fasting 12 hours during the day at the beginning of February but not in the past 2 weeks.  She is trying to eat more regular meals daily.  No current facility-administered medications for this encounter.  Current Outpatient Medications:    Butalbital-APAP-Caffeine 50-325-40 MG capsule, Take 1 capsule by mouth once as needed for up to 1 dose for headache., Disp: 2 capsule, Rfl: 0   Drospirenone (SLYND) 4 MG TABS, Take 1 tablet by mouth daily., Disp: 28 tablet, Rfl: 14   metroNIDAZOLE (FLAGYL) 500 MG tablet, Take 1 tablet (500 mg total) by mouth 2 (two) times daily., Disp: 14 tablet, Rfl: 0   No Known Allergies  Past Medical History:  Diagnosis Date   Anemia    Anemia affecting pregnancy in second trimester 05/10/2021   CBC Latest Ref Rng & Units 05/09/2021 01/18/2021 03/15/2019 WBC 3.4 - 10.8 x10E3/uL 17.3(H) 12.6(H) 19.1(H) Hemoglobin 11.1 - 15.9 g/dL 10.5(L) 11.2 8.0(L) Hematocrit 34.0 - 46.6 % 31.6(L) 34.1 26.8(L) Platelets 150 - 450 x10E3/uL 339 361 277 Oral iron therapy ordered   Asthma    Blood dyscrasia    Group B Streptococcus carrier, +RV culture, currently pregnant 07/22/2021   will need  treatment in labor.   H/O genital injury    Childhood injury where she jumped and landed on a stick that went into her vagina.    Preeclampsia in postpartum period 05/10/2017   Pregnancy induced hypertension    Sickle cell trait (HCC)      Past Surgical History:  Procedure Laterality Date   NO PAST SURGERIES      Family History  Problem Relation Age of Onset   Cancer Paternal Grandmother    Lupus Paternal Grandmother    Cancer Paternal Aunt     Social History   Tobacco Use   Smoking status: Never   Smokeless tobacco: Never  Vaping Use   Vaping Use: Never used  Substance Use Topics   Alcohol use: No   Drug use: No    ROS   Objective:   Vitals: BP 108/70 (BP Location: Left Arm)   Pulse 78   Temp 98.4 F (36.9 C) (Oral)   Resp 16   SpO2 98%   Physical Exam Constitutional:      General: She is not in acute distress.    Appearance: Normal appearance. She is well-developed and normal weight. She is not ill-appearing, toxic-appearing or diaphoretic.  HENT:     Head: Normocephalic and atraumatic.     Right Ear: Tympanic membrane, ear canal and external ear normal. No drainage or tenderness. No middle ear effusion. There is no impacted cerumen. Tympanic membrane is not erythematous or bulging.     Left Ear: Tympanic membrane, ear canal and external ear normal. No drainage  or tenderness.  No middle ear effusion. There is no impacted cerumen. Tympanic membrane is not erythematous or bulging.     Nose: Nose normal. No congestion or rhinorrhea.     Mouth/Throat:     Mouth: Mucous membranes are moist. No oral lesions.     Pharynx: No pharyngeal swelling, oropharyngeal exudate, posterior oropharyngeal erythema or uvula swelling.     Tonsils: No tonsillar exudate or tonsillar abscesses.  Eyes:     General: No scleral icterus.       Right eye: No discharge.        Left eye: No discharge.     Extraocular Movements: Extraocular movements intact.     Right eye: Normal  extraocular motion.     Left eye: Normal extraocular motion.     Conjunctiva/sclera: Conjunctivae normal.  Neck:     Meningeal: Brudzinski's sign and Kernig's sign absent.  Cardiovascular:     Rate and Rhythm: Normal rate.  Pulmonary:     Effort: Pulmonary effort is normal.  Musculoskeletal:     Cervical back: Normal range of motion and neck supple.  Lymphadenopathy:     Cervical: No cervical adenopathy.  Skin:    General: Skin is warm and dry.  Neurological:     General: No focal deficit present.     Mental Status: She is alert and oriented to person, place, and time.     Cranial Nerves: No cranial nerve deficit, dysarthria or facial asymmetry.     Sensory: No sensory deficit.     Motor: No weakness, tremor, atrophy, abnormal muscle tone or pronator drift.     Coordination: Romberg sign negative. Coordination normal. Finger-Nose-Finger Test and Heel to [facility] Test normal. Rapid alternating movements normal.     Gait: Gait and tandem walk normal.     Deep Tendon Reflexes: Reflexes normal.  Psychiatric:        Mood and Affect: Mood normal.        Behavior: Behavior normal.        Thought Content: Thought content normal.        Judgment: Judgment normal.     Assessment and Plan :   PDMP not reviewed this encounter.  1. Generalized headaches   2. Difficulty sleeping     I do not suspect the patient needs CT imaging.  She has a completely normal neurologic exam.  Suspect that the primary issue is related to sleep deprivation, erratic sleep cycles.  Encouraged her to try to wean her son off from breast-feeding or at least to do sleep training so as to not have to do it in the middle of the night.  Encouraged general health maintenance.  Recommended ibuprofen for now for symptomatic relief of the headaches. Counseled patient on potential for adverse effects with medications prescribed/recommended today, ER and return-to-clinic precautions discussed, patient verbalized  understanding.    Jaynee Eagles, [place] 09/02/22 1315

## 2022-09-22 ENCOUNTER — Ambulatory Visit: Payer: Medicaid Other | Admitting: Physician Assistant

## 2022-09-22 ENCOUNTER — Encounter: Payer: Self-pay | Admitting: Physician Assistant

## 2022-09-22 VITALS — BP 119/79 | HR 76 | Wt 133.0 lb

## 2022-09-22 DIAGNOSIS — R0981 Nasal congestion: Secondary | ICD-10-CM | POA: Diagnosis not present

## 2022-09-22 DIAGNOSIS — R519 Headache, unspecified: Secondary | ICD-10-CM | POA: Diagnosis not present

## 2022-09-22 MED ORDER — CYCLOBENZAPRINE HCL 10 MG PO TABS: 10.0000 mg | ORAL_TABLET | Freq: Three times a day (TID) | ORAL | 1 refills | Status: DC | PRN

## 2022-09-22 NOTE — Patient Instructions (Signed)
Keep headache diary: include symptoms, timing, treatment, effects of treatment, any factors that may have contributed to causing/worsening or helping the headache.   Don't forget to add Flonase nasal spray

## 2022-09-22 NOTE — Progress Notes (Signed)
Headaches started 2.11.2024   Seen UC 3.02.24  Headaches cause her to need to lay down  Ibuprofen no real benefit per patient  Location all over head  Has been fasting due to religion x 2 days and the headaches have gotten better.   History:  Nicole Mcmahon is a 27 y.o. [phone] who presents to clinic today for new headache evaluation.  She started getting them on August 13, 2022.  The headaches are severe, lasting all day long.  It will be severe for around 25 minutes and then back off a bit.  It keeps returning to severe throughout the day.  It is located all over her head and causes dizziness.  She describes it as a shock and a pressure.  Movement makes it worse.It is unclear if lights or noises contribute.  No aggravating or alleviating factors.  No known triggers.   She notes complete disability from the pain and her husband has to come and help her.  She has to just lie still and she cries due to the pain.  She denies numbness, tingling, lacrimation, nose running, all visual change.  It does not wake her from sleep.  She had thought it was related to contraception (which she had been on for more than 6 months without issue) and therefore discontinued that for awhile.  It did not help the headache in any way.  She restarted her birth control with no issue.  She is currently fasting for Ramadan but the headaches have improved.  She has 3 children, aged 66 and under.  Her 35 year old does still nurse.  Pt plans to continue breastfeeding an additional year.   During her pregnancy she had an episode of trigeminal neuralgia with severe one sided facial pain.  She used one dose of cyclobenzaprine and prayer, with complete relief of symptoms.  She would like to try this again.  She is not particularly interested in medication but does voice several times strong desire for CT scan to evaluate.   Number of days in the last 4 weeks with:  Severe headache: 13 Moderate headache: 0 Mild headache: 0  No  headache: 15   Past Medical History:  Diagnosis Date   Anemia    Anemia affecting pregnancy in second trimester 05/10/2021   CBC Latest Ref Rng & Units 05/09/2021 01/18/2021 03/15/2019 WBC 3.4 - 10.8 x10E3/uL 17.3(H) 12.6(H) 19.1(H) Hemoglobin 11.1 - 15.9 g/dL 10.5(L) 11.2 8.0(L) Hematocrit 34.0 - 46.6 % 31.6(L) 34.1 26.8(L) Platelets 150 - 450 x10E3/uL 339 361 277 Oral iron therapy ordered   Asthma    Blood dyscrasia    Group B Streptococcus carrier, +RV culture, currently pregnant 07/22/2021   will need treatment in labor.   H/O genital injury    Childhood injury where she jumped and landed on a stick that went into her vagina.    Preeclampsia in postpartum period 05/10/2017   Pregnancy induced hypertension    Sickle cell trait ([place])     Social History   Socioeconomic History   Marital status: Married    Spouse name: Not on file   Number of children: Not on file   Years of education: Not on file   Highest education level: Not on file  Occupational History   Not on file  Tobacco Use   Smoking status: Never   Smokeless tobacco: Never  Vaping Use   Vaping Use: Never used  Substance and Sexual Activity   Alcohol use: No   Drug use:  No   Sexual activity: Yes    Partners: Male    Birth control/protection: None  Other Topics Concern   Not on file  Social History Narrative   Not on file   Social Determinants of Health   Financial Resource Strain: Not on file  Food Insecurity: Not on file  Transportation Needs: Not on file  Physical Activity: Not on file  Stress: Not on file  Social Connections: Not on file  Intimate Partner Violence: Not on file    Family History  Problem Relation Age of Onset   Cancer Paternal Grandmother    Lupus Paternal Grandmother    Cancer Paternal Aunt     No Known Allergies  Current Outpatient Medications on File Prior to Visit  Medication Sig Dispense Refill   Butalbital-APAP-Caffeine 50-325-40 MG capsule Take 1 capsule by mouth once as  needed for up to 1 dose for headache. 2 capsule 0   Drospirenone (SLYND) 4 MG TABS Take 1 tablet by mouth daily. 28 tablet 14   ibuprofen (ADVIL) 600 MG tablet Take 1 tablet (600 mg total) by mouth every 8 (eight) hours as needed. 30 tablet 0   metroNIDAZOLE (FLAGYL) 500 MG tablet Take 1 tablet (500 mg total) by mouth 2 (two) times daily. (Patient not taking: Reported on 09/22/2022) 14 tablet 0   No current facility-administered medications on file prior to visit.     Review of Systems:  All pertinent positive/negative included in HPI, all other review of systems are negative   Objective:  Physical Exam BP 119/79   Pulse 76   Wt 133 lb (60.3 kg)   LMP 09/12/2022   BMI 25.13 kg/m  CONSTITUTIONAL: Well-developed, well-nourished female in no acute distress.  EYES: EOM intact ENT: Normocephalic CARDIOVASCULAR: Regular rate  RESPIRATORY: Normal rate.  MUSCULOSKELETAL: Normal ROM SKIN: Warm, dry without erythema  NEUROLOGICAL: Alert, oriented, CN II-XII grossly intact, Appropriate balance   PSYCH: Normal behavior, mood   Assessment & Plan:  Assessment: 1. Intractable episodic headache, unspecified headache type      Plan: Referral to neurology as pt interested in more than what this [facility] can provide.  Order for CT scan as patient is certain this is warranted.  Cyclobenzaprine to help when the headache begins as it helped before - per patient.  Sedation precautions given.  Pt is encouraged to keep headache diary and be able to provide as much detail regarding her headaches, symptoms and possible triggers.  Okay to continue zyrtec and advised to add flonase. Follow-up PRN  Face to face time during this encounter: 66 minutes  Lacie Draft 09/22/2022 10:33 AM

## 2022-09-26 ENCOUNTER — Ambulatory Visit (HOSPITAL_COMMUNITY)
Admission: RE | Admit: 2022-09-26 | Discharge: 2022-09-26 | Disposition: A | Discharge status: Home / Self Care | Payer: Medicaid Other | Source: Ambulatory Visit | Attending: Physician Assistant | Admitting: Physician Assistant

## 2022-09-26 DIAGNOSIS — R519 Headache, unspecified: Secondary | ICD-10-CM | POA: Diagnosis not present

## 2022-10-02 ENCOUNTER — Encounter: Payer: Self-pay | Admitting: Neurology

## 2022-10-02 ENCOUNTER — Ambulatory Visit: Payer: Medicaid Other | Admitting: Neurology

## 2022-10-02 VITALS — BP 117/70 | HR 71 | Ht 61.0 in | Wt 133.0 lb

## 2022-10-02 DIAGNOSIS — G43709 Chronic migraine without aura, not intractable, without status migrainosus: Secondary | ICD-10-CM | POA: Diagnosis not present

## 2022-10-02 NOTE — Progress Notes (Signed)
Patient: Nicole Mcmahon Date of Birth: 03/14/1996  Reason for Visit: Follow up History from: Patient Primary Neurologist: Krista Blue  ASSESSMENT AND PLAN 27 y.o. year old female   50.  Headaches, with migraine features -Currently breast-feeding, off birth control, currently headaches have resolved, are much better, is fasting for Ramadan -CT head 09/26/22 unremarkable except for sinus disease, will continue to work with PCP for management of sinus issues, some contribution to headache? -For acute headache, may take Flexeril, combine with ibuprofen or Tylenol for prolonged headache -Limited on medication options due to breast-feeding (see below), off birth control -If headaches become frequent may consider preventative: Starting with magnesium and riboflavin, or potentially propranolol -Stay well-hydrated with water -Encouraged to reach out via MyChart if headaches increase, will follow up as needed  Migraine and Breastfeeding:   Data is limiting regarding the safety of migraine medications during breastfeeding. These are some of the medications which appear to be safe during lactation.   Rescue Medications  -Ibuprofen is the preferred NSAID to use while breastfeeding -Tylenol -Eletriptan. Exposure to infant can be minimized by discarding breast milk for 24 hours after dose. -Sumatriptan. Exposure to infant can be minimized by discarding breast milk for 12 hours after dose.   Preventive Supplements -Magnesium oxide 400 mg daily -Riboflavin 200 mg twice a day -Verapamil -Gabapentin - monitor for drowsiness -Propranolol, labetalol - monitor for low heart rate, low blood sugar   Non-medication options: -Physical therapy for the neck -Occipital nerve blocks -Acupuncture -Biofeedback/Relaxation techniques  HISTORY  [name] is a 27 year old female, seen in request by Dr. Darron Doom for evaluation of left facial pain, her primary care physician is Dr.Newton, Juanita Craver,  initial evaluation was August 01, 2021   I reviewed and summarized the referring note. PMHX Sick Cell Trait   She is currently [redacted] weeks pregnant   Patient reported about few weeks ago, without clear triggers, she noticed left facial pain, felt left eye was swelling, pain behind left eye burning sensation, lasting for few hours, she has difficulty sleeping, most symptoms happen at nighttime, her symptoms last about 2 weeks intermittently  Eventually she was given Flexeril 10 mg 3 times daily, as needed, she was able to catch a good night sleep, she no longer have similar symptoms,  She did have a history of intermittent headache in the past, couple times each months, holoacranial pressure headache, with mild light noise sensitivity, usually relieved by Tylenol  Laboratory evaluation in 2022 showed normal or  negative HIV, RPR, hemoglobin 10.5  Update October 02, 2022 SS: Since Feb, random headaches, are all day, have to lay down, make her cry. All over head, not in her face. Recent last week, room was spinning.  Comes on throughout the day. Has tried Excedrin migraine, without help. Has been on progesterone birth control for a year, she stopped it thinking it was cause, is still off. Right now is fasting for Ramadan, headaches have resolved. Has 3 kids, 5, 3, 1. Is still breastfeeding, planning to continue for more 1 year. Doesn't think she can take birth control. CT head showed sinus disease, otherwise was okay. Mentions lack of sleep as trigger, gets up 1-2 times at night. Her GYN gave her Flexeril, haven't taken yet. Has been taking Flonase, Zyrtec D after sinus issues.   IMPRESSION: 1. No acute intracranial abnormality. 2. Sinus disease.  REVIEW OF SYSTEMS: Out of a complete 14 system review of symptoms, the patient complains only of the following symptoms, and  all other reviewed systems are negative.  See HPI  ALLERGIES: No Known Allergies  HOME MEDICATIONS: Outpatient Medications  Prior to Visit  Medication Sig Dispense Refill   Butalbital-APAP-Caffeine 50-325-40 MG capsule Take 1 capsule by mouth once as needed for up to 1 dose for headache. 2 capsule 0   cyclobenzaprine (FLEXERIL) 10 MG tablet Take 1 tablet (10 mg total) by mouth every 8 (eight) hours as needed for muscle spasms. 30 tablet 1   Drospirenone (SLYND) 4 MG TABS Take 1 tablet by mouth daily. 28 tablet 14   ibuprofen (ADVIL) 600 MG tablet Take 1 tablet (600 mg total) by mouth every 8 (eight) hours as needed. 30 tablet 0   metroNIDAZOLE (FLAGYL) 500 MG tablet Take 1 tablet (500 mg total) by mouth 2 (two) times daily. 14 tablet 0   No facility-administered medications prior to visit.    PAST MEDICAL HISTORY: Past Medical History:  Diagnosis Date   Anemia    Anemia affecting pregnancy in second trimester 05/10/2021   CBC Latest Ref Rng & Units 05/09/2021 01/18/2021 03/15/2019 WBC 3.4 - 10.8 x10E3/uL 17.3(H) 12.6(H) 19.1(H) Hemoglobin 11.1 - 15.9 g/dL 10.5(L) 11.2 8.0(L) Hematocrit 34.0 - 46.6 % 31.6(L) 34.1 26.8(L) Platelets 150 - 450 x10E3/uL 339 361 277 Oral iron therapy ordered   Asthma    Blood dyscrasia    Group B Streptococcus carrier, +RV culture, currently pregnant 07/22/2021   will need treatment in labor.   H/O genital injury    Childhood injury where she jumped and landed on a stick that went into her vagina.    Preeclampsia in postpartum period 05/10/2017   Pregnancy induced hypertension    Sickle cell trait     PAST SURGICAL HISTORY: Past Surgical History:  Procedure Laterality Date   NO PAST SURGERIES      FAMILY HISTORY: Family History  Problem Relation Age of Onset   Cancer Paternal Grandmother    Lupus Paternal Grandmother    Cancer Paternal Aunt     SOCIAL HISTORY: Social History   Socioeconomic History   Marital status: Married    Spouse name: Not on file   Number of children: Not on file   Years of education: Not on file   Highest education level: Not on file   Occupational History   Not on file  Tobacco Use   Smoking status: Never   Smokeless tobacco: Never  Vaping Use   Vaping Use: Never used  Substance and Sexual Activity   Alcohol use: No   Drug use: No   Sexual activity: Yes    Partners: Male    Birth control/protection: None  Other Topics Concern   Not on file  Social History Narrative   Not on file   Social Determinants of Health   Financial Resource Strain: Not on file  Food Insecurity: Not on file  Transportation Needs: Not on file  Physical Activity: Not on file  Stress: Not on file  Social Connections: Not on file  Intimate Partner Violence: Not on file    PHYSICAL EXAM  Vitals:   10/02/22 0906  BP: 117/70  Pulse: 71  Weight: 133 lb (60.3 kg)  Height: 5\' 1"  (1.549 m)   Body mass index is 25.13 kg/m.  Generalized: Well developed, in no acute distress  Neurological examination  Mentation: Alert oriented to time, place, history taking. Follows all commands speech and language fluent Cranial nerve II-XII: Pupils were equal round reactive to light. Extraocular movements were full, visual field  were full on confrontational test. Facial sensation and strength were normal.  Head turning and shoulder shrug  were normal and symmetric. Motor: The motor testing reveals 5 over 5 strength of all 4 extremities. Good symmetric motor tone is noted throughout.  Sensory: Sensory testing is intact to soft touch on all 4 extremities. No evidence of extinction is noted.  Coordination: Cerebellar testing reveals good finger-nose-finger and heel-to-shin bilaterally.  Gait and station: Gait is normal. Tandem gait is normal.  Reflexes: Deep tendon reflexes are symmetric and normal bilaterally.   DIAGNOSTIC DATA (LABS, IMAGING, TESTING) - I reviewed patient records, labs, notes, testing and imaging myself where available.  Lab Results  Component Value Date   WBC 10.0 08/21/2022   HGB 12.4 08/21/2022   HCT 36.7 08/21/2022   MCV  90 08/21/2022   PLT 404 08/21/2022      Component Value Date/Time   NA 139 08/21/2022 1606   K 4.3 08/21/2022 1606   CL 103 08/21/2022 1606   CO2 18 (L) 08/21/2022 1606   GLUCOSE 74 08/21/2022 1606   GLUCOSE 79 08/10/2021 1843   BUN 8 08/21/2022 1606   CREATININE 0.69 08/21/2022 1606   CALCIUM 9.7 08/21/2022 1606   PROT 7.5 08/21/2022 1606   ALBUMIN 4.9 08/21/2022 1606   AST 17 08/21/2022 1606   ALT 22 08/21/2022 1606   ALKPHOS 90 08/21/2022 1606   BILITOT 0.6 08/21/2022 1606   GFRNONAA >60 08/10/2021 1843   GFRAA >60 03/13/2019 1258   No results found for: "CHOL", "HDL", "LDLCALC", "LDLDIRECT", "TRIG", "CHOLHDL" No results found for: "HGBA1C" No results found for: "VITAMINB12" Lab Results  Component Value Date   TSH 4.370 08/21/2022    Butler Denmark, AGNP-C, DNP 10/02/2022, 9:39 AM [facility] [address] [place], [place] [address] [phone]

## 2022-10-02 NOTE — Patient Instructions (Addendum)
Use the Flexeril as needed for headache, may combine with ibuprofen or tylenol for prolonged headache  Continue to work with your primary care doctor for sinus disease management  Send me a message if headaches increase   Migraine and Breastfeeding:   Data is limiting regarding the safety of migraine medications during breastfeeding. These are some of the medications which appear to be safe during lactation.   Rescue Medications  -Ibuprofen is the preferred NSAID to use while breastfeeding -Tylenol -Eletriptan. Exposure to infant can be minimized by discarding breast milk for 24 hours after dose. -Sumatriptan. Exposure to infant can be minimized by discarding breast milk for 12 hours after dose.   Preventive Supplements -Magnesium oxide 400 mg daily -Riboflavin 200 mg twice a day -Verapamil -Gabapentin - monitor for drowsiness -Propranolol, labetalol - monitor for low heart rate, low blood sugar   Non-medication options: -Physical therapy for the neck -Occipital nerve blocks -Acupuncture -Biofeedback/Relaxation techniques

## 2022-10-11 NOTE — Progress Notes (Signed)
Chart reviewed, agree above plan ?

## 2022-11-02 ENCOUNTER — Encounter: Payer: Self-pay | Admitting: Obstetrics and Gynecology

## 2022-11-22 ENCOUNTER — Other Ambulatory Visit (INDEPENDENT_AMBULATORY_CARE_PROVIDER_SITE_OTHER): Payer: Medicaid Other

## 2022-11-22 ENCOUNTER — Ambulatory Visit (INDEPENDENT_AMBULATORY_CARE_PROVIDER_SITE_OTHER): Payer: Medicaid Other | Admitting: Family Medicine

## 2022-11-22 VITALS — BP 113/75 | HR 97 | Wt 134.0 lb

## 2022-11-22 DIAGNOSIS — Z3481 Encounter for supervision of other normal pregnancy, first trimester: Secondary | ICD-10-CM

## 2022-11-22 DIAGNOSIS — Z3A01 Less than 8 weeks gestation of pregnancy: Secondary | ICD-10-CM

## 2022-11-22 DIAGNOSIS — O3680X0 Pregnancy with inconclusive fetal viability, not applicable or unspecified: Secondary | ICD-10-CM

## 2022-11-22 DIAGNOSIS — O021 Missed abortion: Secondary | ICD-10-CM | POA: Insufficient documentation

## 2022-11-22 NOTE — Progress Notes (Signed)
   Subjective:    Patient ID: Nicole Mcmahon is a 27 y.o. female presenting with OB interview  on 11/22/2022  HPI: Here for RN visit for dating u/s. Had previous u/s at [facility] that showed 5 w 5 d no FHR 2 weeks ago. Denies bleeding, cramping.   Review of Systems  Constitutional:  Negative for chills and fever.  Respiratory:  Negative for shortness of breath.   Cardiovascular:  Negative for chest pain.  Gastrointestinal:  Negative for abdominal pain, nausea and vomiting.  Genitourinary:  Negative for dysuria.  Skin:  Negative for rash.      Objective:    BP 113/75   Pulse 97   Wt 134 lb (60.8 kg)   LMP 09/12/2022   BMI 25.32 kg/m  Physical Exam Exam conducted with a chaperone present.  Constitutional:      General: She is not in acute distress.    Appearance: She is well-developed.  HENT:     Head: Normocephalic and atraumatic.  Eyes:     General: No scleral icterus. Cardiovascular:     Rate and Rhythm: Normal rate.  Pulmonary:     Effort: Pulmonary effort is normal.  Abdominal:     Palpations: Abdomen is soft.  Musculoskeletal:     Cervical back: Neck supple.  Skin:    General: Skin is warm and dry.  Neurological:     Mental Status: She is alert and oriented to person, place, and time.    TVUS reveals 6 wk 1 day CRL with no FHR c/w failed IUP.     Assessment & Plan:   Problem List Items Addressed This Visit       Unprioritized   Missed ab    Discussed common causes at early gestational age and management options including R/B/A of expectant management, Cytotec, and D & E. RH status is positive. Discussed impossible to prevent, predict and likelihood of repeat. Normal grieving process. She elects for expectant mgmt, if no change in 2 weeks, let us know.       Other Visit Diagnoses     Encounter to determine fetal viability of pregnancy, single or unspecified fetus    -  Primary   c/w missed AB--given option for Cytotec, D and E and  expectant mgmt.   Relevant Orders   US OB Limited       Return in about 2 weeks (around 12/06/2022).  Reva Bores, MD 11/22/2022 2:53 PM

## 2022-11-22 NOTE — Progress Notes (Signed)
Patient informed that the ultrasound is considered a limited obstetric ultrasound and is not intended to be a complete ultrasound exam.  Patient also informed that the ultrasound is not being completed with the intent of assessing for fetal or placental anomalies or any pelvic abnormalities. Explained that the purpose of today's ultrasound is to assess for fetal heart rate.  Patient acknowledges the purpose of the exam and the limitations of the study.      Pt [facility] on 11[facility], they saw a yolk sac and a fetal pole but no FHR.

## 2022-11-22 NOTE — Assessment & Plan Note (Addendum)
Discussed common causes at early gestational age and management options including R/B/A of expectant management, Cytotec, and D & E. RH status is positive. Discussed impossible to prevent, predict and likelihood of repeat. Normal grieving process. She elects for expectant mgmt, if no change in 2 weeks, let us know.

## 2022-12-10 ENCOUNTER — Encounter (HOSPITAL_COMMUNITY): Payer: Self-pay | Admitting: Obstetrics and Gynecology

## 2022-12-10 ENCOUNTER — Inpatient Hospital Stay (HOSPITAL_COMMUNITY)
Admission: AD | Admit: 2022-12-10 | Discharge: 2022-12-10 | Disposition: A | Discharge status: Home / Self Care | Payer: Medicaid Other | Attending: Obstetrics and Gynecology | Admitting: Obstetrics and Gynecology

## 2022-12-10 ENCOUNTER — Inpatient Hospital Stay (HOSPITAL_COMMUNITY): Payer: Medicaid Other

## 2022-12-10 ENCOUNTER — Other Ambulatory Visit: Payer: Self-pay

## 2022-12-10 DIAGNOSIS — R000 Tachycardia, unspecified: Secondary | ICD-10-CM | POA: Diagnosis not present

## 2022-12-10 DIAGNOSIS — O209 Hemorrhage in early pregnancy, unspecified: Secondary | ICD-10-CM | POA: Diagnosis not present

## 2022-12-10 DIAGNOSIS — O039 Complete or unspecified spontaneous abortion without complication: Secondary | ICD-10-CM | POA: Diagnosis not present

## 2022-12-10 DIAGNOSIS — Z6790 Unspecified blood type, Rh positive: Secondary | ICD-10-CM | POA: Diagnosis not present

## 2022-12-10 DIAGNOSIS — R42 Dizziness and giddiness: Secondary | ICD-10-CM | POA: Diagnosis not present

## 2022-12-10 DIAGNOSIS — Z3A01 Less than 8 weeks gestation of pregnancy: Secondary | ICD-10-CM | POA: Insufficient documentation

## 2022-12-10 DIAGNOSIS — R58 Hemorrhage, not elsewhere classified: Secondary | ICD-10-CM | POA: Diagnosis not present

## 2022-12-10 LAB — URINALYSIS, ROUTINE W REFLEX MICROSCOPIC

## 2022-12-10 LAB — CBC
HCT: 32.6 % — ABNORMAL LOW (ref 36.0–46.0)
Hemoglobin: 10.5 g/dL — ABNORMAL LOW (ref 12.0–15.0)
MCH: 30 pg (ref 26.0–34.0)
MCHC: 32.2 g/dL (ref 30.0–36.0)
MCV: 93.1 fL (ref 80.0–100.0)
Platelets: 302 10*3/uL (ref 150–400)
RBC: 3.5 MIL/uL — ABNORMAL LOW (ref 3.87–5.11)
RDW: 11.9 % (ref 11.5–15.5)
WBC: 12.4 10*3/uL — ABNORMAL HIGH (ref 4.0–10.5)
nRBC: 0 % (ref 0.0–0.2)

## 2022-12-10 LAB — URINALYSIS, MICROSCOPIC (REFLEX): RBC / HPF: >50 RBC/hpf (ref 0–5)

## 2022-12-10 MED ORDER — LACTATED RINGERS IV BOLUS
1000.0000 mL | Freq: Once | INTRAVENOUS | Status: AC
  Administered 2022-12-10: 1000 mL via INTRAVENOUS

## 2022-12-10 MED ORDER — FERROUS SULFATE 325 (65 FE) MG PO TABS: 325.0000 mg | ORAL_TABLET | Freq: Every day | ORAL | 0 refills | Status: DC

## 2022-12-10 NOTE — MAU Note (Signed)
.  Nicole Mcmahon is a 27 y.o. at [redacted]w[redacted]d here in MAU reporting: vaginal bleeding and abdominal pain. Pt. Seen in office on 5/22 for missed AB. Pt. Agreed to expectant management. Pt. Has not returned to office. Pt. Has been spotting for the past week but today noticed large golf ball sized clots and had a near syncopal episode at home where her husband had to catch her around 1500.  LMP: 09/12/2022 Onset of complaint: 12/10/22 Pain score: 3/10 Vitals:   12/10/22 1709 12/10/22 1711  BP: 122/79   Pulse: 86   Resp: 18   SpO2:  100%     Lab orders placed from triage:   UA

## 2022-12-10 NOTE — MAU Provider Note (Signed)
Chief Complaint: Abdominal Pain and Vaginal Bleeding   Event Date/Time   First Provider Initiated Contact with Patient 12/10/22 2012      SUBJECTIVE HPI: Nicole Mcmahon is a 27 y.o. [id number] at [redacted]w[redacted]d by LMP with dx missed ab on 11/22/22, choosing expectant management and follow up in 2 weeks, who presents to maternity admissions reporting onset of heavy bleeding and abdominal cramping today.  She is soaking a pad every hour with large clots.    HPI  Past Medical History:  Diagnosis Date   Anemia    Anemia affecting pregnancy in second trimester 05/10/2021   CBC Latest Ref Rng & Units 05/09/2021 01/18/2021 03/15/2019 WBC 3.4 - 10.8 x10E3/uL 17.3(H) 12.6(H) 19.1(H) Hemoglobin 11.1 - 15.9 g/dL 10.5(L) 11.2 8.0(L) Hematocrit 34.0 - 46.6 % 31.6(L) 34.1 26.8(L) Platelets 150 - 450 x10E3/uL 339 361 277 Oral iron therapy ordered   Asthma    Blood dyscrasia    Group B Streptococcus carrier, +RV culture, currently pregnant 07/22/2021   will need treatment in labor.   H/O genital injury    Childhood injury where she jumped and landed on a stick that went into her vagina.    Preeclampsia in postpartum period 05/10/2017   Pregnancy induced hypertension    Sickle cell trait (HCC)    Past Surgical History:  Procedure Laterality Date   NO PAST SURGERIES     Social History   Socioeconomic History   Marital status: Married    Spouse name: Not on file   Number of children: Not on file   Years of education: Not on file   Highest education level: Not on file  Occupational History   Not on file  Tobacco Use   Smoking status: Never   Smokeless tobacco: Never  Vaping Use   Vaping Use: Never used  Substance and Sexual Activity   Alcohol use: No   Drug use: No   Sexual activity: Yes    Partners: Male    Birth control/protection: None  Other Topics Concern   Not on file  Social History Narrative   Not on file   Social Determinants of Health   Financial Resource Strain: Not on file  Food  Insecurity: Not on file  Transportation Needs: Not on file  Physical Activity: Not on file  Stress: Not on file  Social Connections: Not on file  Intimate Partner Violence: Not on file   No current facility-administered medications on file prior to encounter.   Current Outpatient Medications on File Prior to Encounter  Medication Sig Dispense Refill   Butalbital-APAP-Caffeine 50-325-40 MG capsule Take 1 capsule by mouth once as needed for up to 1 dose for headache. (Patient not taking: Reported on 11/22/2022) 2 capsule 0   cyclobenzaprine (FLEXERIL) 10 MG tablet Take 1 tablet (10 mg total) by mouth every 8 (eight) hours as needed for muscle spasms. (Patient not taking: Reported on 11/22/2022) 30 tablet 1   No Known Allergies  ROS:  Review of Systems  Constitutional:  Negative for chills, fatigue and fever.  Respiratory:  Negative for cough and shortness of breath.   Cardiovascular:  Negative for chest pain.  Gastrointestinal:  Positive for abdominal pain. Negative for nausea and vomiting.  Genitourinary:  Positive for vaginal bleeding. Negative for difficulty urinating, dysuria, flank pain, frequency, pelvic pain, urgency, vaginal discharge and vaginal pain.  Musculoskeletal: Negative.   Neurological:  Negative for dizziness and headaches.  Psychiatric/Behavioral: Negative.       I have reviewed patient's Past  Medical Hx, Surgical Hx, Family Hx, Social Hx, medications and allergies.   Physical Exam  Patient Vitals for the past 24 hrs:  BP Temp Temp src Pulse Resp SpO2  12/10/22 1731 118/76 -- -- 98 -- --  12/10/22 1721 -- -- -- -- -- 100 %  12/10/22 1716 116/78 -- -- 97 -- 100 %  12/10/22 1711 -- -- -- -- -- 100 %  12/10/22 1709 122/79 98.3 F (36.8 C) Oral 86 18 --   Constitutional: Well-developed, well-nourished female in moderate distress.  Cardiovascular: normal rate Respiratory: normal effort GI: Abd soft, non-tender. Pos BS x 4 MS: Extremities nontender, no edema,  normal ROM Neurologic: Alert and oriented x 4.  GU: Neg CVAT.  PELVIC EXAM: Cervix pink, visually closed, without lesion,small amount dark red bleeding, one fox swab used to visualize cervix, vaginal walls and external genitalia normal   LAB RESULTS Results for orders placed or performed during the hospital encounter of 12/10/22 (from the past 24 hour(s))  Urinalysis, Routine w reflex microscopic -Urine, Clean Catch     Status: Abnormal   Collection Time: 12/10/22  5:30 PM  Result Value Ref Range   Color, Urine RED (A) YELLOW   APPearance TURBID (A) CLEAR   Specific Gravity, Urine  1.005 - 1.030    TEST NOT REPORTED DUE TO COLOR INTERFERENCE OF URINE PIGMENT   pH  5.0 - 8.0    TEST NOT REPORTED DUE TO COLOR INTERFERENCE OF URINE PIGMENT   Glucose, UA (A) NEGATIVE mg/dL    TEST NOT REPORTED DUE TO COLOR INTERFERENCE OF URINE PIGMENT   Hgb urine dipstick (A) NEGATIVE    TEST NOT REPORTED DUE TO COLOR INTERFERENCE OF URINE PIGMENT   Bilirubin Urine (A) NEGATIVE    TEST NOT REPORTED DUE TO COLOR INTERFERENCE OF URINE PIGMENT   Ketones, ur (A) NEGATIVE mg/dL    TEST NOT REPORTED DUE TO COLOR INTERFERENCE OF URINE PIGMENT   Protein, ur (A) NEGATIVE mg/dL    TEST NOT REPORTED DUE TO COLOR INTERFERENCE OF URINE PIGMENT   Nitrite (A) NEGATIVE    TEST NOT REPORTED DUE TO COLOR INTERFERENCE OF URINE PIGMENT   Leukocytes,Ua (A) NEGATIVE    TEST NOT REPORTED DUE TO COLOR INTERFERENCE OF URINE PIGMENT  Urinalysis, Microscopic (reflex)     Status: Abnormal   Collection Time: 12/10/22  5:30 PM  Result Value Ref Range   RBC / HPF >50 0 - 5 RBC/hpf   WBC, UA 0-5 0 - 5 WBC/hpf   Bacteria, UA RARE (A) NONE SEEN   Squamous Epithelial / HPF 0-5 0 - 5 /HPF  CBC     Status: Abnormal   Collection Time: 12/10/22  6:53 PM  Result Value Ref Range   WBC 12.4 (H) 4.0 - 10.5 K/uL   RBC 3.50 (L) 3.87 - 5.11 MIL/uL   Hemoglobin 10.5 (L) 12.0 - 15.0 g/dL   HCT 40.9 (L) 81.1 - 91.4 %   MCV 93.1 80.0 -  100.0 fL   MCH 30.0 26.0 - 34.0 pg   MCHC 32.2 30.0 - 36.0 g/dL   RDW 78.2 95.6 - 21.3 %   Platelets 302 150 - 400 K/uL   nRBC 0.0 0.0 - 0.2 %       IMAGING US OB LESS THAN 14 WEEKS WITH OB TRANSVAGINAL  Result Date: 12/10/2022 CLINICAL DATA:  Vaginal bleeding. EXAM: OBSTETRIC <14 WK [place] AND TRANSVAGINAL OB US TECHNIQUE: Both transabdominal and transvaginal ultrasound examinations were performed for  complete evaluation of the gestation as well as the maternal uterus, adnexal regions, and pelvic cul-de-sac. Transvaginal technique was performed to assess early pregnancy. COMPARISON:  None Available. FINDINGS: Intrauterine gestational sac: Single, located within the cervix/vaginal region. Yolk sac:  Not Visualized. Embryo:  Visualized. Cardiac Activity: Not Visualized. Heart Rate: N/A  bpm CRL:  3.6 mm mm   6 w   0 d Subchorionic hemorrhage:  None visualized. Maternal uterus/adnexae: The bilateral ovaries are visualized and are normal in appearance. A small amount of pelvic free fluid is seen. IMPRESSION: Findings are suspicious for failed pregnancy. Recommend follow-up US in 10-14 days for definitive diagnosis. This recommendation follows SRU consensus guidelines: Diagnostic Criteria for Nonviable Pregnancy Early in the First Trimester. Malva Limes Med 2013; 409:8119-14. Electronically Signed   By: Aram Candela M.D.   On: 12/10/2022 19:47   US OB Limited  Result Date: 11/22/2022 ----------------------------------------------------------------------  OBSTETRICS REPORT                       (Signed Final 11/22/2022 04:49 pm) ---------------------------------------------------------------------- Patient Info  ID #:       [id number]                          D.O.B.:  12-19-1995 (26 yrs)  Name:       Nicole Mcmahon                  Visit Date: 11/22/2022 02:58 pm ---------------------------------------------------------------------- Performed By  Attending:        Tinnie Gens MD         Ref. Address:     [address]                                                             [address]  Performed By:     Scheryl Marten RN     [facility]         [facility]                                                             [facility]  Referred By:      [facility] ---------------------------------------------------------------------- Orders  #  Description  Code        Ordered By  1  US OB LIMITED                         [id number]     Tinnie Gens ----------------------------------------------------------------------  #  Order #                     Accession #                Episode #  1  [id number]                   [id number]                 [id number] ---------------------------------------------------------------------- Indications  Less than 8 weeks of pregnancy                 Z3A.01 ---------------------------------------------------------------------- Fetal Evaluation  Num Of Fetuses:         1  Preg. Location:         Intrauterine  Gest. Sac:              Intrauterine  Yolk Sac:               Visualized  Fetal Pole:             Visualized  Cardiac Activity:       Absent ---------------------------------------------------------------------- Biometry  CRL:       3.2  mm     G. Age:  6w 0d                   EDD:   07/18/23 ---------------------------------------------------------------------- OB History  Gravidity:    4         Term:   3        Prem:   0        SAB:   0  TOP:          0       Ectopic:  0        Living: 3 ---------------------------------------------------------------------- Impression  Single IUP with absent FHR > 2 weeks since prior similar u/s  Findings are c/w failed IUP ---------------------------------------------------------------------- Recommendations  Follow-up as clinically indicated.  ----------------------------------------------------------------------                  Tinnie Gens, MD Electronically Signed Final Report   11/22/2022 04:49 pm ----------------------------------------------------------------------   MAU Management/MDM: Orders Placed This Encounter  Procedures   US OB LESS THAN 14 WEEKS WITH OB TRANSVAGINAL   Urinalysis, Routine w reflex microscopic -Urine, Clean Catch   Urinalysis, Microscopic (reflex)   CBC   Discharge patient    Meds ordered this encounter  Medications   lactated ringers bolus 1,000 mL   ferrous sulfate 325 (65 FE) MG tablet    Sig: Take 1 tablet (325 mg total) by mouth daily with breakfast.    Dispense:  30 tablet    Refill:  0    Order Specific Question:   Supervising Provider    AnswerLennart Pall [[id number]]    [place] today with findings confirming failed pregnancy with gestational sac in cervix/vagina.  Gestational sac/POCs not seen on pelvic exam (done after tdoay's [place]) and bleeding slowed. Hgb 10.5, and pt VS stable. Pt desires d/C home.  Bleeding observed x 30 minutes in MAU and soaked less than 1/4 pad.  D/C home with bleeding precautions.  Message sent to f/u at [facility] in 1 week.    ASSESSMENT 1. SAB (spontaneous abortion)   2. Vaginal bleeding affecting early pregnancy   3. Blood type, Rh positive   4. [redacted] weeks gestation of pregnancy     PLAN Discharge home with bleeding precautions  Allergies as of 12/10/2022   No Known Allergies      Medication List     TAKE these medications    Butalbital-APAP-Caffeine 50-325-40 MG capsule Take 1 capsule by mouth once as needed for up to 1 dose for headache.   cyclobenzaprine 10 MG tablet Commonly known as: FLEXERIL Take 1 tablet (10 mg total) by mouth every 8 (eight) hours as needed for muscle spasms.   ferrous sulfate 325 (65 FE) MG tablet Take 1 tablet (325 mg total) by mouth daily with breakfast.        Follow-up Information     [facility] for  [facility] Follow up in 1 week(s).   Specialty: Obstetrics and Gynecology Why: The clinic will call you with appointment. Contact information: [address] [place] [address] [phone]        [facility] Follow up.   Specialty: Obstetrics and Gynecology Why: As needed for emergencies Contact information: [address] [id number] [place]he[place] [address] [phone]                Sharen Counter Certified Nurse-Midwife 12/10/2022  8:25 PM

## 2022-12-15 ENCOUNTER — Encounter: Payer: Medicaid Other | Admitting: Family Medicine

## 2022-12-19 ENCOUNTER — Encounter: Payer: Self-pay | Admitting: *Deleted

## 2022-12-19 ENCOUNTER — Encounter: Payer: Self-pay | Admitting: Obstetrics and Gynecology

## 2022-12-27 ENCOUNTER — Encounter: Payer: Self-pay | Admitting: Family Medicine

## 2022-12-27 ENCOUNTER — Ambulatory Visit (INDEPENDENT_AMBULATORY_CARE_PROVIDER_SITE_OTHER): Payer: Medicaid Other | Admitting: Family Medicine

## 2022-12-27 VITALS — BP 123/77 | HR 103 | Wt 133.0 lb

## 2022-12-27 DIAGNOSIS — O039 Complete or unspecified spontaneous abortion without complication: Secondary | ICD-10-CM | POA: Diagnosis not present

## 2022-12-27 NOTE — Progress Notes (Signed)
   Subjective:    Patient ID: Nicole Mcmahon is a 27 y.o. female presenting with Follow-up  on 12/27/2022  HPI: Patient is s/p SAB with MAU visit on 6/9 which showed the IUGS in the cervix or vagina. Minimal bleeding, but has had unprotected intercourse   Review of Systems  Constitutional:  Negative for chills and fever.  Respiratory:  Negative for shortness of breath.   Cardiovascular:  Negative for chest pain.  Gastrointestinal:  Negative for abdominal pain, nausea and vomiting.  Genitourinary:  Negative for dysuria.  Skin:  Negative for rash.      Objective:    BP 123/77   Pulse (!) 103   Wt 133 lb (60.3 kg)   BMI 25.13 kg/m  Physical Exam Exam conducted with a chaperone present.  Constitutional:      General: She is not in acute distress.    Appearance: She is well-developed.  HENT:     Head: Normocephalic and atraumatic.  Eyes:     General: No scleral icterus. Cardiovascular:     Rate and Rhythm: Normal rate.  Pulmonary:     Effort: Pulmonary effort is normal.  Abdominal:     Palpations: Abdomen is soft.  Musculoskeletal:     Cervical back: Neck supple.  Skin:    General: Skin is warm and dry.  Neurological:     Mental Status: She is alert and oriented to person, place, and time.         Assessment & Plan:  SAB (spontaneous abortion) - resume birth cotnrol after next normal cycle. minimal bleeding, Do not suspect retained POC.  Return if symptoms worsen or fail to improve.  Reva Bores, MD 12/27/2022 1:33 PM

## 2022-12-27 NOTE — Progress Notes (Signed)
F/U SAB

## 2023-02-15 ENCOUNTER — Ambulatory Visit: Payer: Medicaid Other

## 2023-02-15 ENCOUNTER — Other Ambulatory Visit (HOSPITAL_COMMUNITY)
Admission: RE | Admit: 2023-02-15 | Discharge: 2023-02-15 | Disposition: A | Discharge status: Home / Self Care | Payer: Medicaid Other | Source: Ambulatory Visit | Attending: Family Medicine | Admitting: Family Medicine

## 2023-02-15 VITALS — BP 118/79 | HR 82

## 2023-02-15 DIAGNOSIS — N898 Other specified noninflammatory disorders of vagina: Secondary | ICD-10-CM

## 2023-02-15 DIAGNOSIS — Z3202 Encounter for pregnancy test, result negative: Secondary | ICD-10-CM

## 2023-02-15 LAB — POCT URINE PREGNANCY: Preg Test, Ur: NEGATIVE

## 2023-02-15 MED ORDER — METRONIDAZOLE 500 MG PO TABS: 500.0000 mg | ORAL_TABLET | Freq: Two times a day (BID) | ORAL | 0 refills | Status: AC

## 2023-02-15 NOTE — Progress Notes (Signed)
SUBJECTIVE:  27 y.o. female complains of copious and malodorous vaginal discharge for few day(s). Pt also requests a UPT.  Denies abnormal vaginal bleeding or significant pelvic pain or fever. No UTI symptoms. Denies history of known exposure to STD.  No LMP recorded.  OBJECTIVE:  She appears well, afebrile. Urine dipstick: not done. UPT-negative  ASSESSMENT:  Vaginal Discharge  Vaginal Odor   PLAN:  GC, chlamydia, trichomonas, BVAG, CVAG probe sent to lab. Treatment: Pt requests to get medication for BV and is aware she may need different medication once results come back. ROV prn if symptoms persist or worsen.   Scheryl Marten, RN

## 2023-02-16 LAB — CERVICOVAGINAL ANCILLARY ONLY
Bacterial Vaginitis (gardnerella): POSITIVE — AB
Candida Glabrata: NEGATIVE
Candida Vaginitis: NEGATIVE
Chlamydia: NEGATIVE
Comment: NEGATIVE
Comment: NEGATIVE
Comment: NEGATIVE
Comment: NEGATIVE
Comment: NEGATIVE
Comment: NORMAL
Neisseria Gonorrhea: NEGATIVE
Trichomonas: NEGATIVE

## 2023-03-29 ENCOUNTER — Emergency Department (HOSPITAL_COMMUNITY)
Admission: EM | Admit: 2023-03-29 | Discharge: 2023-03-29 | Disposition: A | Discharge status: Home / Self Care | Payer: Medicaid Other | Attending: Emergency Medicine | Admitting: Emergency Medicine

## 2023-03-29 ENCOUNTER — Other Ambulatory Visit: Payer: Self-pay

## 2023-03-29 ENCOUNTER — Encounter (HOSPITAL_COMMUNITY): Payer: Self-pay | Admitting: Emergency Medicine

## 2023-03-29 DIAGNOSIS — Z1152 Encounter for screening for COVID-19: Secondary | ICD-10-CM | POA: Diagnosis not present

## 2023-03-29 DIAGNOSIS — J020 Streptococcal pharyngitis: Secondary | ICD-10-CM | POA: Diagnosis not present

## 2023-03-29 DIAGNOSIS — J45909 Unspecified asthma, uncomplicated: Secondary | ICD-10-CM | POA: Insufficient documentation

## 2023-03-29 DIAGNOSIS — J029 Acute pharyngitis, unspecified: Secondary | ICD-10-CM | POA: Diagnosis present

## 2023-03-29 LAB — SARS CORONAVIRUS 2 BY RT PCR: SARS Coronavirus 2 by RT PCR: NEGATIVE

## 2023-03-29 LAB — GROUP A STREP BY PCR: Group A Strep by PCR: DETECTED — AB

## 2023-03-29 LAB — POC URINE PREG, ED: Preg Test, Ur: NEGATIVE

## 2023-03-29 MED ORDER — DEXAMETHASONE SODIUM PHOSPHATE 10 MG/ML IJ SOLN
10.0000 mg | Freq: Once | INTRAMUSCULAR | Status: AC
  Administered 2023-03-29: 10 mg via INTRAMUSCULAR
  Filled 2023-03-29: qty 1

## 2023-03-29 MED ORDER — KETOROLAC TROMETHAMINE 15 MG/ML IJ SOLN
15.0000 mg | Freq: Once | INTRAMUSCULAR | Status: AC
  Administered 2023-03-29: 15 mg via INTRAMUSCULAR
  Filled 2023-03-29: qty 1

## 2023-03-29 MED ORDER — PENICILLIN G BENZATHINE 1200000 UNIT/2ML IM SUSY
1.2000 10*6.[IU] | PREFILLED_SYRINGE | Freq: Once | INTRAMUSCULAR | Status: AC
  Administered 2023-03-29: 1.2 10*6.[IU] via INTRAMUSCULAR
  Filled 2023-03-29: qty 2

## 2023-03-29 NOTE — Discharge Instructions (Addendum)
It was a pleasure taking care of you today.  As discussed, your strep test was positive.  You were treated for strep here in the ER.  Return to the ER for new or worsening symptoms.

## 2023-03-29 NOTE — ED Provider Notes (Signed)
EMERGENCY DEPARTMENT AT [facility] Provider Note   CSN: [id number] Arrival date & time: 03/29/23  1947     History  Chief Complaint  Patient presents with   Sore Throat    Nicole Mcmahon is a 27 y.o. female with a past medical history significant for sickle cell trait, anemia, and asthma who presents to the ED due to sore throat for the past few days.  Admits to swollen lymph nodes in neck.  No fever or chills.  Children sick with a cough.  Denies cough.  Denies difficulty swallowing secondary to pain.  No shortness of breath.  No changes to phonation.  History obtained from patient and past medical records. No interpreter used during encounter.       Home Medications Prior to Admission medications   Medication Sig Start Date End Date Taking? Authorizing Provider  Butalbital-APAP-Caffeine 50-325-40 MG capsule Take 1 capsule by mouth once as needed for up to 1 dose for headache. Patient not taking: Reported on 11/22/2022 08/21/22   Pomona Bing, MD  cyclobenzaprine (FLEXERIL) 10 MG tablet Take 1 tablet (10 mg total) by mouth every 8 (eight) hours as needed for muscle spasms. Patient not taking: Reported on 11/22/2022 09/22/22   Glyn Ade, Scot Jun, PA-C  ferrous sulfate 325 (65 FE) MG tablet Take 1 tablet (325 mg total) by mouth daily with breakfast. Patient not taking: Reported on 12/27/2022 12/10/22   Hurshel Party, CNM      Allergies    Patient has no known allergies.    Review of Systems   Review of Systems  Constitutional:  Negative for fever.  HENT:  Positive for sore throat and trouble swallowing. Negative for voice change.     Physical Exam Updated Vital Signs BP 127/81 (BP Location: Left Arm)   Pulse (!) 105   Temp 98.7 F (37.1 C) (Oral)   Resp 18   Ht 5\' 1"  (1.549 m)   Wt 61.2 kg   LMP 03/29/2023 (Exact Date)   SpO2 100%   BMI 25.51 kg/m  Physical Exam Vitals and nursing note reviewed.  Constitutional:      General:  She is not in acute distress.    Appearance: She is not ill-appearing.  HENT:     Head: Normocephalic.     Mouth/Throat:     Comments: 2+ tonsillar hypertrophy with bilateral exudates.  Uvula midline.  No abscess.  Tolerating oral secretions without difficulty.  Normal phonation. Eyes:     Pupils: Pupils are equal, round, and reactive to light.  Cardiovascular:     Rate and Rhythm: Normal rate and regular rhythm.     Pulses: Normal pulses.     Heart sounds: Normal heart sounds. No murmur heard.    No friction rub. No gallop.  Pulmonary:     Effort: Pulmonary effort is normal.     Breath sounds: Normal breath sounds.  Abdominal:     General: Abdomen is flat. There is no distension.     Palpations: Abdomen is soft.     Tenderness: There is no abdominal tenderness. There is no guarding or rebound.  Musculoskeletal:        General: Normal range of motion.     Cervical back: Neck supple.  Lymphadenopathy:     Cervical: Cervical adenopathy present.  Skin:    General: Skin is warm and dry.  Neurological:     General: No focal deficit present.     Mental Status: She is  alert.  Psychiatric:        Mood and Affect: Mood normal.        Behavior: Behavior normal.     ED Results / Procedures / Treatments   Labs (all labs ordered are listed, but only abnormal results are displayed) Labs Reviewed  GROUP A STREP BY PCR - Abnormal; Notable for the following components:      Result Value   Group A Strep by PCR DETECTED (*)    All other components within normal limits  SARS CORONAVIRUS 2 BY RT PCR  POC URINE PREG, ED    EKG None  Radiology No results found.  Procedures Procedures    Medications Ordered in ED Medications  ketorolac (TORADOL) 15 MG/ML injection 15 mg (15 mg Intramuscular Given 03/29/23 2151)  dexamethasone (DECADRON) injection 10 mg (10 mg Intramuscular Given 03/29/23 2151)  penicillin g benzathine (BICILLIN LA) 1200000 UNIT/2ML injection 1.2 Million Units (1.2  Million Units Intramuscular Given 03/29/23 2151)    ED Course/ Medical Decision Making/ A&P Clinical Course as of 03/29/23 2217  Thu Mar 29, 2023  2119 Group A Strep by PCR(!): DETECTED [CA]  2136 Preg Test, Ur: NEGATIVE [CA]    Clinical Course User Index [CA] Mannie Stabile, PA-C                                 Medical Decision Making Amount and/or Complexity of Data Reviewed Labs:  Decision-making details documented in ED Course.  Risk Prescription drug management.   27 year old female presents to the ED due to sore throat for the past few days.  Children sick with a cough.  No changes to phonation.  Denies fever and chills.  Upon arrival patient mildly tachycardic at 105.  Patient afebrile.  Patient in no acute distress. 2+ tonsillar hypertrophy with bilateral exudates.  Uvula midline.  No abscess.  Tolerating oral secretions without difficulty.  Normal phonation.  COVID test ordered. IM decadron and Toradol given.  Strep positive. Patient given Bicillin. No abscess on exam. Airway patent. Patient stable for discharge. Strict ED precautions discussed with patient. Patient states understanding and agrees to plan. Patient discharged home in no acute distress and stable vitals  Lives at home No PCP Hx asthma       Final Clinical Impression(s) / ED Diagnoses Final diagnoses:  Strep pharyngitis    Rx / DC Orders ED Discharge Orders     None         Mannie Stabile, PA-C 03/29/23 2219    Elayne Snare K, DO 03/29/23 2343

## 2023-03-29 NOTE — ED Triage Notes (Signed)
Pt arriving with complaint of sore throat and ringing in her ears for the last couple days. Pts tonsils are red and swollen with white patches. Airway patent. No difficulty swallowing.

## 2023-04-20 ENCOUNTER — Emergency Department (HOSPITAL_COMMUNITY)
Admission: EM | Admit: 2023-04-20 | Discharge: 2023-04-20 | Discharge status: Against Medical Advice | Payer: Medicaid Other | Attending: Emergency Medicine | Admitting: Emergency Medicine

## 2023-04-20 DIAGNOSIS — J029 Acute pharyngitis, unspecified: Secondary | ICD-10-CM | POA: Diagnosis not present

## 2023-04-20 DIAGNOSIS — Z5321 Procedure and treatment not carried out due to patient leaving prior to being seen by health care provider: Secondary | ICD-10-CM | POA: Diagnosis not present

## 2023-04-20 LAB — GROUP A STREP BY PCR: Group A Strep by PCR: NOT DETECTED

## 2023-04-20 NOTE — ED Triage Notes (Signed)
Patient here from home reporting sore throat that started yesterday.

## 2023-05-15 ENCOUNTER — Other Ambulatory Visit (INDEPENDENT_AMBULATORY_CARE_PROVIDER_SITE_OTHER): Payer: Medicaid Other

## 2023-05-15 ENCOUNTER — Ambulatory Visit: Payer: Medicaid Other | Admitting: *Deleted

## 2023-05-15 VITALS — BP 126/74 | HR 87 | Wt 132.0 lb

## 2023-05-15 DIAGNOSIS — O09299 Supervision of pregnancy with other poor reproductive or obstetric history, unspecified trimester: Secondary | ICD-10-CM

## 2023-05-15 DIAGNOSIS — O3680X0 Pregnancy with inconclusive fetal viability, not applicable or unspecified: Secondary | ICD-10-CM

## 2023-05-15 DIAGNOSIS — Z3A01 Less than 8 weeks gestation of pregnancy: Secondary | ICD-10-CM | POA: Diagnosis not present

## 2023-05-15 DIAGNOSIS — Z3481 Encounter for supervision of other normal pregnancy, first trimester: Secondary | ICD-10-CM | POA: Diagnosis not present

## 2023-05-15 DIAGNOSIS — Z3480 Encounter for supervision of other normal pregnancy, unspecified trimester: Secondary | ICD-10-CM | POA: Insufficient documentation

## 2023-05-15 NOTE — Progress Notes (Signed)
New OB Intake  I explained I am completing New OB Intake today. We discussed EDD of 01/03/2024, by Last Menstrual Period. Pt is [id number]. I reviewed her allergies, medications and Medical/Surgical/OB history.    Patient Active Problem List   Diagnosis Date Noted   Supervision of other normal pregnancy, antepartum 05/15/2023   Chronic migraine w/o aura w/o status migrainosus, not intractable 08/01/2021   History of pospartum preeclampsia after antepartum GHTN in prior pregnancy, currently pregnant 05/10/2017    Concerns addressed today  Patient informed that the ultrasound is considered a limited obstetric ultrasound and is not intended to be a complete ultrasound exam.  Patient also informed that the ultrasound is not being completed with the intent of assessing for fetal or placental anomalies or any pelvic abnormalities. Explained that the purpose of today's ultrasound is to assess for viability.  Patient acknowledges the purpose of the exam and the limitations of the study.     Delivery Plans Plans to deliver at [facility].     Anatomy US Explained first scheduled [place] will be around 19 weeks.   Last Pap Diagnosis  Date Value Ref Range Status  01/18/2021 (A)  Final   - Atypical squamous cells of undetermined significance (ASC-US)    First visit review I reviewed new OB appt with patient. Explained pt will be seen by Dr Macon Large at first visit. Discussed Avelina Laine genetic screening with patient , will get Panorama drawn at [facility].Already had Horizon with prior pregnancy.. Routine prenatal labs to be collected at [facility].    Scheryl Marten, RN 05/15/2023  1:42 PM

## 2023-06-25 ENCOUNTER — Encounter: Payer: Self-pay | Admitting: Obstetrics & Gynecology

## 2023-06-25 ENCOUNTER — Other Ambulatory Visit (HOSPITAL_COMMUNITY)
Admission: RE | Admit: 2023-06-25 | Discharge: 2023-06-25 | Disposition: A | Discharge status: Home / Self Care | Payer: Medicaid Other | Source: Ambulatory Visit | Attending: Obstetrics & Gynecology | Admitting: Obstetrics & Gynecology

## 2023-06-25 ENCOUNTER — Ambulatory Visit (INDEPENDENT_AMBULATORY_CARE_PROVIDER_SITE_OTHER): Payer: Medicaid Other | Admitting: Obstetrics & Gynecology

## 2023-06-25 VITALS — BP 124/77 | HR 99 | Wt 139.0 lb

## 2023-06-25 DIAGNOSIS — Z1339 Encounter for screening examination for other mental health and behavioral disorders: Secondary | ICD-10-CM | POA: Diagnosis not present

## 2023-06-25 DIAGNOSIS — Z3481 Encounter for supervision of other normal pregnancy, first trimester: Secondary | ICD-10-CM | POA: Diagnosis not present

## 2023-06-25 DIAGNOSIS — Z3A13 13 weeks gestation of pregnancy: Secondary | ICD-10-CM

## 2023-06-25 DIAGNOSIS — O09299 Supervision of pregnancy with other poor reproductive or obstetric history, unspecified trimester: Secondary | ICD-10-CM | POA: Diagnosis not present

## 2023-06-25 DIAGNOSIS — Z3480 Encounter for supervision of other normal pregnancy, unspecified trimester: Secondary | ICD-10-CM | POA: Diagnosis not present

## 2023-06-25 DIAGNOSIS — Z131 Encounter for screening for diabetes mellitus: Secondary | ICD-10-CM | POA: Diagnosis not present

## 2023-06-25 DIAGNOSIS — O09291 Supervision of pregnancy with other poor reproductive or obstetric history, first trimester: Secondary | ICD-10-CM | POA: Diagnosis not present

## 2023-06-25 MED ORDER — ASPIRIN 81 MG PO TBEC
162.0000 mg | DELAYED_RELEASE_TABLET | Freq: Every day | ORAL | 2 refills | Status: DC
Start: 2023-06-25 — End: 2024-01-13

## 2023-06-25 NOTE — Progress Notes (Signed)
History:   Nicole Mcmahon is a 27 y.o. [phone] at [redacted]w[redacted]d by LMP, early ultrasound being seen today for her first obstetrical visit.  Her obstetrical history is significant for pre-eclampsia during last two pregnancies, was induced for this last time. Patient does intend to breast feed. Pregnancy history fully reviewed.  Patient reports no complaints.     HISTORY: OB History  Gravida Para Term Preterm AB Living  5 3 3  0 1 3  SAB IAB Ectopic Multiple Live Births  1 0 0 0 3    # Outcome Date GA Lbr Len/2nd Weight Sex Type Anes PTL Lv  5 Current           4 SAB 11/22/22 [redacted]w[redacted]d         3 Term 08/12/21 [redacted]w[redacted]d 06:14 / 00:37 8 lb 1.8 oz (3.68 kg) M Vag-Vacuum EPI  LIV     Name: KYRI, LATORRE     Apgar1: 5  Apgar5: 9  2 Term 03/14/19 [redacted]w[redacted]d 11:37 / 01:47 8 lb 2.2 oz (3.691 kg) M Vag-Spont EPI  LIV     Name: MEDRITH, CLABO     Apgar1: 8  Apgar5: 9  1 Term 05/04/17 [redacted]w[redacted]d 23:05 / 00:32 6 lb 3 oz (2.807 kg) M Vag-Spont EPI  LIV     Name: LATERIKA, BOESCH     Apgar1: 9  Apgar5: 9    Last pap smear was done 01/18/2021 and was  ASCUS with negative HRHPV.  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    Preeclampsia 05/10/2017   Postpartum   Sickle cell trait (HCC)    Past Surgical History:  Procedure Laterality Date   NO PAST SURGERIES     Family History  Problem Relation Age of Onset   Cancer Paternal Grandmother    Lupus Paternal Grandmother    Cancer Paternal Aunt    Social History   Tobacco Use   Smoking status: Never   Smokeless tobacco: Never  Vaping Use   Vaping  status: Never Used  Substance Use Topics   Alcohol use: No   Drug use: No   No Known Allergies No current outpatient medications on file prior to visit.   No current facility-administered medications on file prior to visit.   Review of Systems Pertinent items noted in HPI and remainder of comprehensive ROS otherwise negative.  Physical Exam:   Vitals:   06/25/23 1456  BP: 124/77  Pulse: 99  Weight: 139 lb (63 kg)   Fetal Heart Rate (bpm): 155 (On bedside ultrasound)   General: well-developed, well-nourished female in no acute distress  Breasts:  normal appearance, no masses or tenderness bilaterally, exam done in the presence of a chaperone.   Skin: normal coloration and turgor, no rashes  Neurologic: oriented, normal, negative, normal mood  Extremities: normal strength, tone, and muscle mass, ROM of all joints is normal  HEENT PERRLA, extraocular movement intact and sclera clear, anicteric  Neck supple and no masses  Cardiovascular:  regular rate and rhythm  Respiratory:  no respiratory distress, normal breath sounds  Abdomen: soft, non-tender; bowel sounds normal; no masses,  no organomegaly  Pelvic: deferred    Assessment:    Pregnancy: [id number] Patient Active Problem List   Diagnosis Date Noted   Supervision of other normal pregnancy, antepartum 05/15/2023   Chronic migraine w/o aura w/o status migrainosus, not intractable 08/01/2021   History of preeclampsia in two previous pregnancies 05/10/2017    Plan:    1. History of preeclampsia in two previous pregnancies (Primary) Recommended Aspirin 162 mg po every day for prophylaxis, will check BP weekly.  Will check baseline labs today.  - Comprehensive metabolic panel - Protein / creatinine ratio, urine - [place] MFM OB DETAIL +14 WK; Future - aspirin EC 81 MG tablet; Take 2 tablets (162 mg total) by mouth at bedtime. Start taking when you are [redacted] weeks pregnant for rest of pregnancy for prevention of preeclampsia   Dispense: 300 tablet; Refill: 2  2. [redacted] weeks gestation of pregnancy 3. Supervision of other normal pregnancy, antepartum - CBC/D/Plt+RPR+Rh+ABO+RubIgG... - Hemoglobin A1c - PANORAMA PRENATAL TEST - Urine cytology ancillary only - Culture, OB Urine - [place] MFM OB DETAIL +14 WK; Future  Initial labs drawn. Continue prenatal vitamins. Problem list reviewed and updated. Genetic Screening discussed, Panorama: ordered. Ultrasound discussed; fetal anatomic survey: scheduled. Anticipatory guidance about prenatal visits given including labs, ultrasounds, and testing. Discussed usage of the Babys[employer]s app for more information about pregnancy, and to track blood pressures. Also discussed usage of virtual visits as additional source of managing and completing prenatal visits.  Patient was encouraged to use MyChart to review results, send requests, and have questions addressed.   The nature of [facility] with multiple MDs and Advanced Practice Providers was explained to patient; also emphasized that residents, students are part of our team. Routine obstetric precautions reviewed. Encouraged to seek out care at our office or emergency room [facility] preferred) for urgent and/or emergent concerns. Return in about 4 weeks (around 07/23/2023) for OFFICE OB VISIT (MD or APP).     Jaynie Collins, MD, FACOG Obstetrician & Gynecologist, [facility] for [employer], [facility]

## 2023-06-26 LAB — COMPREHENSIVE METABOLIC PANEL
ALT: 27 [IU]/L (ref 0–32)
AST: 22 [IU]/L (ref 0–40)
Albumin: 4.5 g/dL (ref 4.0–5.0)
Alkaline Phosphatase: 72 [IU]/L (ref 44–121)
BUN/Creatinine Ratio: 12 (ref 9–23)
BUN: 6 mg/dL (ref 6–20)
Bilirubin Total: 0.4 mg/dL (ref 0.0–1.2)
CO2: 21 mmol/L (ref 20–29)
Calcium: 9.3 mg/dL (ref 8.7–10.2)
Chloride: 102 mmol/L (ref 96–106)
Creatinine, Ser: 0.5 mg/dL — ABNORMAL LOW (ref 0.57–1.00)
Globulin, Total: 2.6 g/dL (ref 1.5–4.5)
Glucose: 66 mg/dL — ABNORMAL LOW (ref 70–99)
Potassium: 3.8 mmol/L (ref 3.5–5.2)
Sodium: 137 mmol/L (ref 134–144)
Total Protein: 7.1 g/dL (ref 6.0–8.5)
eGFR: 132 mL/min/{1.73_m2} (ref >59)

## 2023-06-26 LAB — CBC/D/PLT+RPR+RH+ABO+RUBIGG...
Antibody Screen: NEGATIVE
Basophils Absolute: 0 10*3/uL (ref 0.0–0.2)
Basos: 0 %
EOS (ABSOLUTE): 0.2 10*3/uL (ref 0.0–0.4)
Eos: 1 %
HCV Ab: NONREACTIVE
HIV Screen 4th Generation wRfx: NONREACTIVE
Hematocrit: 38.7 % (ref 34.0–46.6)
Hemoglobin: 12.3 g/dL (ref 11.1–15.9)
Hepatitis B Surface Ag: NEGATIVE
Immature Grans (Abs): 0 10*3/uL (ref 0.0–0.1)
Immature Granulocytes: 0 %
Lymphocytes Absolute: 3.2 10*3/uL — ABNORMAL HIGH (ref 0.7–3.1)
Lymphs: 24 %
MCH: 30.3 pg (ref 26.6–33.0)
MCHC: 31.8 g/dL (ref 31.5–35.7)
MCV: 95 fL (ref 79–97)
Monocytes Absolute: 0.5 10*3/uL (ref 0.1–0.9)
Monocytes: 4 %
Neutrophils Absolute: 9.3 10*3/uL — ABNORMAL HIGH (ref 1.4–7.0)
Neutrophils: 71 %
Platelets: 372 10*3/uL (ref 150–450)
RBC: 4.06 x10E6/uL (ref 3.77–5.28)
RDW: 13.1 % (ref 11.7–15.4)
RPR Ser Ql: NONREACTIVE
Rh Factor: POSITIVE
Rubella Antibodies, IGG: <0.9 {index} — ABNORMAL LOW (ref >0.99)
WBC: 13.2 10*3/uL — ABNORMAL HIGH (ref 3.4–10.8)

## 2023-06-26 LAB — PROTEIN / CREATININE RATIO, URINE
Creatinine, Urine: 121.2 mg/dL
Protein, Ur: 9.9 mg/dL
Protein/Creat Ratio: 82 mg/g{creat} (ref 0–200)

## 2023-06-26 LAB — HCV INTERPRETATION

## 2023-06-26 LAB — HEMOGLOBIN A1C
Est. average glucose Bld gHb Est-mCnc: 88 mg/dL
Hgb A1c MFr Bld: 4.7 % — ABNORMAL LOW (ref 4.8–5.6)

## 2023-06-27 LAB — URINE CULTURE, OB REFLEX: Organism ID, Bacteria: NO GROWTH

## 2023-06-27 LAB — CULTURE, OB URINE

## 2023-06-28 ENCOUNTER — Encounter: Payer: Self-pay | Admitting: Obstetrics & Gynecology

## 2023-06-28 ENCOUNTER — Emergency Department (HOSPITAL_COMMUNITY)
Admission: EM | Admit: 2023-06-28 | Discharge: 2023-06-28 | Disposition: A | Discharge status: Home / Self Care | Payer: Medicaid Other | Attending: Emergency Medicine | Admitting: Emergency Medicine

## 2023-06-28 ENCOUNTER — Encounter (HOSPITAL_COMMUNITY): Payer: Self-pay

## 2023-06-28 ENCOUNTER — Other Ambulatory Visit: Payer: Self-pay

## 2023-06-28 DIAGNOSIS — O99820 Streptococcus B carrier state complicating pregnancy: Secondary | ICD-10-CM | POA: Diagnosis not present

## 2023-06-28 DIAGNOSIS — O99511 Diseases of the respiratory system complicating pregnancy, first trimester: Secondary | ICD-10-CM | POA: Diagnosis not present

## 2023-06-28 DIAGNOSIS — Z3A13 13 weeks gestation of pregnancy: Secondary | ICD-10-CM | POA: Diagnosis not present

## 2023-06-28 DIAGNOSIS — Z7982 Long term (current) use of aspirin: Secondary | ICD-10-CM | POA: Insufficient documentation

## 2023-06-28 DIAGNOSIS — J020 Streptococcal pharyngitis: Secondary | ICD-10-CM | POA: Diagnosis not present

## 2023-06-28 LAB — URINE CYTOLOGY ANCILLARY ONLY
Chlamydia: NEGATIVE
Comment: NEGATIVE
Comment: NORMAL
Neisseria Gonorrhea: NEGATIVE

## 2023-06-28 LAB — GROUP A STREP BY PCR: Group A Strep by PCR: DETECTED — AB

## 2023-06-28 LAB — RESP PANEL BY RT-PCR (RSV, FLU A&B, COVID)  RVPGX2
Influenza A by PCR: NEGATIVE
Influenza B by PCR: NEGATIVE
Resp Syncytial Virus by PCR: NEGATIVE
SARS Coronavirus 2 by RT PCR: NEGATIVE

## 2023-06-28 MED ORDER — PENICILLIN V POTASSIUM 500 MG PO TABS: 500.0000 mg | ORAL_TABLET | Freq: Two times a day (BID) | ORAL | 0 refills | Status: AC

## 2023-06-28 NOTE — ED Triage Notes (Signed)
Pt complaining of sore throat that began apprx 2 days ago. Pt endorses pain while swallowing, swelling, and redness. Pt had strep throat several months ago and concerned that she has it again.

## 2023-06-28 NOTE — ED Provider Notes (Signed)
[place] EMERGENCY DEPARTMENT AT [facility] Provider Note   CSN: [id number] Arrival date & time: 06/28/23  4098     History  Chief Complaint  Patient presents with   Sore Throat         Nicole Mcmahon is a 27 y.o. female.  27 year old female presents today for concern of sore throat.  Started 2 days ago.  Normal voice, no difficulty swallowing, no drooling.  She states it feels like strep pharyngitis.  She is [redacted] weeks pregnant.  The history is provided by the patient. No language interpreter was used.       Home Medications Prior to Admission medications   Medication Sig Start Date End Date Taking? Authorizing Provider  aspirin EC 81 MG tablet Take 2 tablets (162 mg total) by mouth at bedtime. Start taking when you are [redacted] weeks pregnant for rest of pregnancy for prevention of preeclampsia 06/25/23   Anyanwu, Jethro Bastos, MD  penicillin v potassium (VEETID) 500 MG tablet Take 1 tablet (500 mg total) by mouth in the morning and at bedtime for 10 days. 06/28/23 07/08/23 Yes Karie Mainland, [employer]m, PA-C      Allergies    Patient has no known allergies.    Review of Systems   Review of Systems  Constitutional:  Negative for chills and fever.  HENT:  Positive for sore throat. Negative for trouble swallowing and voice change.   Respiratory:  Negative for cough.   All other systems reviewed and are negative.   Physical Exam Updated Vital Signs BP 123/73 (BP Location: Left Arm)   Pulse (!) 109   Temp 98.6 F (37 C) (Oral)   Resp 18   Ht 5\' 1"  (1.549 m)   Wt 61.7 kg   LMP 03/29/2023 (Exact Date)   SpO2 100%   BMI 25.70 kg/m  Physical Exam Vitals and nursing note reviewed.  Constitutional:      General: She is not in acute distress.    Appearance: Normal appearance. She is not ill-appearing.  HENT:     Head: Normocephalic and atraumatic.     Nose: Nose normal.     Mouth/Throat:     Pharynx: Posterior oropharyngeal erythema present. No oropharyngeal exudate.      Comments: No evidence of peritonsillar abscess, retropharyngeal abscess, Ludwig's angina.  No trismus. Eyes:     Conjunctiva/sclera: Conjunctivae normal.  Pulmonary:     Effort: Pulmonary effort is normal. No respiratory distress.  Musculoskeletal:        General: No deformity.  Skin:    Findings: No rash.  Neurological:     Mental Status: She is alert.     ED Results / Procedures / Treatments   Labs (all labs ordered are listed, but only abnormal results are displayed) Labs Reviewed  GROUP A STREP BY PCR - Abnormal; Notable for the following components:      Result Value   Group A Strep by PCR DETECTED (*)    All other components within normal limits  RESP PANEL BY RT-PCR (RSV, FLU A&B, COVID)  RVPGX2    EKG None  Radiology No results found.  Procedures Procedures    Medications Ordered in ED Medications - No data to display  ED Course/ Medical Decision Making/ A&P                                 Medical Decision Making Risk Prescription drug management.  27 year old female presents today for concern of pharyngitis.  Strep and respiratory panel was ordered.  Strep positive.  Respiratory panel negative.  Reassuring exam.  Penicillin prescribed.  Return precaution discussed.  No other complaints at this time.  Discharged in stable condition.   Final Clinical Impression(s) / ED Diagnoses Final diagnoses:  Strep pharyngitis    Rx / DC Orders ED Discharge Orders          Ordered    penicillin v potassium (VEETID) 500 MG tablet  2 times daily        06/28/23 1205              Marita Kansas, PA-C 06/28/23 1209    Rozelle Logan, DO 06/28/23 1553

## 2023-06-28 NOTE — Discharge Instructions (Signed)
Your strep test was positive.  I have sent penicillin to the pharmacy for you.  Follow-up with your primary care provider if needed.  Return to the emergency department for any concerning symptoms.

## 2023-07-04 LAB — PANORAMA PRENATAL TEST FULL PANEL:PANORAMA TEST PLUS 5 ADDITIONAL MICRODELETIONS: FETAL FRACTION: 12.9

## 2023-07-04 NOTE — L&D Delivery Note (Signed)
 [facility]N [facility] Delivery Note  Nicole Mcmahon is a 28 y.o. [id number] s/p NSVD at [redacted]w[redacted]d. She was admitted for postdates IOL.   ROM: SROM 0h 65m with clear fluid GBS Status: pos, penicillin  given Maximum Maternal Temperature: 98.76F  Labor Progress: Progressed to complete following single dose of Cytotec  and Pitocin ; found to be complete shortly after starting Pitocin  at 22mu/min.  Delivery Date/Time: 01/11/24 @ 1432 Delivery: Called to room and patient was complete and pushing. Head delivered LOA, restituted to LOT. No nuchal cord present. >60 seconds between delivery of head and shoulders due to pause in maternal pushing efforts. Once patient was able to focus, shoulder and body delivered in usual fashion with single pushing effort. Infant with weak cry, placed on mother's abdomen, dried and stimulated. Baby's heart rate low, so cord clamped x 2, cut by SNM, and baby taken to warmer. Cord blood collected. Placenta delivered spontaneously, intact with accessory lobe, with 3-vessel cord. Fundus firm with massage and Pitocin . Labia, perineum, vagina, and cervix inspected; 1st degree perineal laceration found, hemostatic, no repair needed. Baby swaddled and returned to father of baby prior to SNM and CNM leaving the room.  Placenta: intact with accessory lobe, to L&D Complications: none Lacerations: 1st degree perineal, hemostatic, no repair EBL: 53mL Analgesia: nitrous  Infant: female  APGARs 7,9  3200g  [name], SNM 01/11/2024, 3:17 PM

## 2023-07-06 ENCOUNTER — Encounter: Payer: Self-pay | Admitting: Obstetrics & Gynecology

## 2023-07-23 ENCOUNTER — Ambulatory Visit: Payer: Medicaid Other | Admitting: Family Medicine

## 2023-07-23 ENCOUNTER — Encounter: Payer: Self-pay | Admitting: Family Medicine

## 2023-07-23 VITALS — BP 120/76 | HR 75 | Temp 97.8°F | Ht 61.0 in | Wt 137.0 lb

## 2023-07-23 DIAGNOSIS — O09299 Supervision of pregnancy with other poor reproductive or obstetric history, unspecified trimester: Secondary | ICD-10-CM

## 2023-07-23 DIAGNOSIS — Z3A16 16 weeks gestation of pregnancy: Secondary | ICD-10-CM

## 2023-07-23 DIAGNOSIS — O09899 Supervision of other high risk pregnancies, unspecified trimester: Secondary | ICD-10-CM

## 2023-07-23 DIAGNOSIS — Z2839 Other underimmunization status: Secondary | ICD-10-CM

## 2023-07-23 NOTE — Progress Notes (Deleted)
New Patient Office Visit  Subjective    Patient ID: Nicole Mcmahon, female    DOB: 1996-01-31  Age: 28 y.o. MRN: [id number]  CC: No chief complaint on file.   HPI [name] presents to establish care today. Has hx asthma, anemia, migraine, Sickle Cell Trait.    Outpatient Encounter Medications as of 07/23/2023  Medication Sig   aspirin EC 81 MG tablet Take 2 tablets (162 mg total) by mouth at bedtime. Start taking when you are [redacted] weeks pregnant for rest of pregnancy for prevention of preeclampsia   No facility-administered encounter medications on file as of 07/23/2023.    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    Preeclampsia 05/10/2017   Postpartum   Sickle cell trait (HCC)     Past Surgical History:  Procedure Laterality Date   NO PAST SURGERIES      Family History  Problem Relation Age of Onset   Cancer Paternal Grandmother    Lupus Paternal Grandmother    Cancer Paternal Aunt     Social History   Socioeconomic History   Marital status: Married    Spouse name: Not on file   Number of children: Not on file   Years of education: Not on file   Highest education level: Not on file  Occupational History   Not on file  Tobacco Use   Smoking status: Never   Smokeless tobacco: Never  Vaping Use   Vaping status: Never Used  Substance and Sexual Activity   Alcohol use: No   Drug use: No   Sexual activity: Yes    Partners: Male    Birth control/protection: None  Other Topics Concern   Not on file  Social History Narrative   Not on file    Social Drivers of Health   Financial Resource Strain: Not on file  Food Insecurity: Not on file  Transportation Needs: Not on file  Physical Activity: Not on file  Stress: Not on file  Social Connections: Not on file  Intimate Partner Violence: Not on file    ROS Per HPI      Objective    LMP 03/29/2023 (Exact Date)   Physical Exam Vitals and nursing note reviewed.  Constitutional:      Appearance: Normal appearance. She is normal weight.  HENT:     Head: Normocephalic and atraumatic.     Right Ear: Tympanic membrane and ear canal normal.     Left Ear: Tympanic membrane and ear canal normal.     Nose: Nose normal.  Eyes:     Extraocular Movements: Extraocular movements intact.     Pupils: Pupils are equal, round, and reactive to light.  Cardiovascular:     Rate and Rhythm: Normal rate and regular rhythm.     Heart sounds: Normal heart sounds.  Pulmonary:     Effort: Pulmonary effort is normal.     Breath sounds: Normal breath sounds.  Musculoskeletal:  General: Normal range of motion.     Cervical back: Normal range of motion.  Neurological:     General: No focal deficit present.     Mental Status: She is alert and oriented to person, place, and time.  Psychiatric:        Mood and Affect: Mood normal.        Thought Content: Thought content normal.         Assessment & Plan:   There are no diagnoses linked to this encounter.   No follow-ups on file.   Moshe Cipro, FNP

## 2023-07-23 NOTE — Progress Notes (Signed)
New Patient Office Visit  Subjective    Patient ID: Nicole Mcmahon, female    DOB: 23-May-1996  Age: 28 y.o. MRN: [id number]  CC:  Chief Complaint  Patient presents with   Establish Care    Establish Care. Patient currently pregnant. Has had miscarriage in the past (last year) wants to make sure things are going well. Does currently see OBGYN (last seen last month). Patient also notes having strep back to back last year (November and December)    HPI Nicole Mcmahon presents to establish care today. Has hx asthma, anemia, migraine, Sickle Cell Trait. She is [redacted] weeks pregnant.  Has had strep throat x 2 over the last 3 months. Has 7 children at home (all boys). Denies any vaginal bleeding, uterine cramping.  Thinks she may have felt baby kick yesterday for the first time. Denies other concerns today.    Outpatient Encounter Medications as of 07/23/2023  Medication Sig   aspirin EC 81 MG tablet Take 2 tablets (162 mg total) by mouth at bedtime. Start taking when you are [redacted] weeks pregnant for rest of pregnancy for prevention of preeclampsia   No facility-administered encounter medications on file as of 07/23/2023.    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    Preeclampsia 05/10/2017   Postpartum   Sickle cell trait (HCC)     Past Surgical History:  Procedure Laterality Date   NO PAST SURGERIES      Family History  Problem Relation Age of Onset   Cancer Paternal Grandmother    Lupus Paternal Grandmother    Cancer Paternal Aunt     Social History   Socioeconomic  History   Marital status: Married    Spouse name: Not on file   Number of children: Not on file   Years of education: Not on file   Highest education level: Not on file  Occupational History   Not on file  Tobacco Use   Smoking status: Never   Smokeless tobacco: Never  Vaping Use   Vaping status: Never Used  Substance and Sexual Activity   Alcohol use: No   Drug use: No   Sexual activity: Yes    Partners: Male    Birth control/protection: None  Other Topics Concern   Not on file  Social History Narrative   Not on file   Social Drivers of Health   Financial Resource Strain: Not on file  Food Insecurity: Not on file  Transportation Needs: Not on file  Physical Activity: Not on file  Stress: Not on file  Social Connections: Not on file  Intimate Partner Violence: Not on file    ROS Per HPI      Objective    BP 120/76   Pulse 75   Temp 97.8 F (36.6 C)   Ht 5\' 1"  (1.549 m)   Wt 137 lb (62.1 kg)   LMP 03/29/2023 (Exact Date)   SpO2 98%   BMI 25.89 kg/m  Physical Exam Vitals and nursing note reviewed.  Constitutional:      General: She is not in acute distress.    Appearance: Normal appearance.  HENT:     Head: Normocephalic and atraumatic.     Nose: Nose normal.  Eyes:     Extraocular Movements: Extraocular movements intact.     Pupils: Pupils are equal, round, and reactive to light.  Cardiovascular:     Rate and Rhythm: Normal rate and regular rhythm.     Heart sounds: Normal heart sounds.  Pulmonary:     Effort: Pulmonary effort is normal. No respiratory distress.     Breath sounds: Normal breath sounds. No wheezing, rhonchi or rales.  Abdominal:     Comments: Gravid   Musculoskeletal:        General: Normal range of motion.     Cervical back: Normal range of motion.  Neurological:     General: No focal deficit present.     Mental Status: She is alert and oriented to person, place, and time.  Psychiatric:        Mood and Affect: Mood  normal.        Thought Content: Thought content normal.         Assessment & Plan:   Rubella non-immune status, antepartum  [redacted] weeks gestation of pregnancy  History of preeclampsia in two previous pregnancies  Recommended MMR vaccine after birth Continue to follow up with OBGYN Continue ASA as prescribed   Return as needed.   Moshe Cipro, FNP

## 2023-07-23 NOTE — Patient Instructions (Signed)
Welcome to [facility]!  Congratulations on your pregnancy!  Continue current medication regimen  Continue to follow up with OBGYN as scheduled  Follow-up with me after delivery, sooner if needed

## 2023-07-25 ENCOUNTER — Ambulatory Visit: Payer: Medicaid Other | Admitting: Family Medicine

## 2023-07-25 VITALS — BP 118/75 | HR 72 | Wt 134.4 lb

## 2023-07-25 DIAGNOSIS — Z3480 Encounter for supervision of other normal pregnancy, unspecified trimester: Secondary | ICD-10-CM

## 2023-07-25 DIAGNOSIS — O09299 Supervision of pregnancy with other poor reproductive or obstetric history, unspecified trimester: Secondary | ICD-10-CM

## 2023-07-25 DIAGNOSIS — Z2839 Other underimmunization status: Secondary | ICD-10-CM

## 2023-07-25 DIAGNOSIS — Z3A16 16 weeks gestation of pregnancy: Secondary | ICD-10-CM

## 2023-07-25 DIAGNOSIS — O09892 Supervision of other high risk pregnancies, second trimester: Secondary | ICD-10-CM

## 2023-07-25 NOTE — Progress Notes (Signed)
    PRENATAL VISIT NOTE  Subjective:  Nicole Mcmahon is a 28 y.o. G5P3013 at [redacted]w[redacted]d being seen today for ongoing prenatal care.  She is currently monitored for the following issues for this low-risk pregnancy and has Rubella non-immune status, antepartum; History of preeclampsia in two previous pregnancies; Chronic migraine w/o aura w/o status migrainosus, not intractable; and Supervision of other normal pregnancy, antepartum on their problem list.  Patient reports no complaints.  Contractions: Not present. Vag. Bleeding: None.   . Denies leaking of fluid.   The following portions of the patient's history were reviewed and updated as appropriate: allergies, current medications, past family history, past medical history, past social history, past surgical history and problem list.   Objective:   Vitals:   07/25/23 1100  BP: 118/75  Pulse: 72  Weight: 134 lb 6.4 oz (61 kg)    Fetal Status: Fetal Heart Rate (bpm): 143         General:  Alert, oriented and cooperative. Patient is in no acute distress.  Skin: Skin is warm and dry. No rash noted.   Cardiovascular: Normal heart rate noted  Respiratory: Normal respiratory effort, no problems with respiration noted  Abdomen: Soft, gravid, appropriate for gestational age.  Pain/Pressure: Absent     Pelvic: Cervical exam deferred        Extremities: Normal range of motion.  Edema: None  Mental Status: Normal mood and affect. Normal behavior. Normal judgment and thought content.   Assessment and Plan:  Pregnancy: [id number] at [redacted]w[redacted]d 1. Supervision of other normal pregnancy, antepartum (Primary) Worried about weight gain. Minimal at present. Denies N/V. Will continue to watch.  2. Rubella non-immune status, antepartum MMR pp  3. History of preeclampsia in two previous pregnancies Baby ASA 162 mg daily  4. [redacted] weeks gestation of pregnancy   General obstetric precautions including but not limited to vaginal bleeding, contractions, leaking of  fluid and fetal movement were reviewed in detail with the patient. Please refer to After Visit Summary for other counseling recommendations.   Return in 4 weeks (on 08/22/2023).  Future Appointments  Date Time Provider Department Center  08/09/2023  8:15 AM [facility] NURSE [facility]  08/09/2023  8:30 AM [facility] US5 [facility]  08/09/2023  9:30 AM [facility] PROVIDER 1 [facility]  08/22/2023 10:35 AM Reva Bores, MD [facility]  09/19/2023 10:35 AM Reva Bores, MD [facility]    Reva Bores, MD

## 2023-07-25 NOTE — Progress Notes (Signed)
ROB- doing well.

## 2023-08-09 ENCOUNTER — Ambulatory Visit: Payer: Medicaid Other | Attending: Obstetrics & Gynecology

## 2023-08-09 ENCOUNTER — Other Ambulatory Visit: Payer: Self-pay

## 2023-08-09 ENCOUNTER — Ambulatory Visit: Payer: Medicaid Other | Admitting: *Deleted

## 2023-08-09 ENCOUNTER — Ambulatory Visit (HOSPITAL_BASED_OUTPATIENT_CLINIC_OR_DEPARTMENT_OTHER): Payer: Medicaid Other | Admitting: Obstetrics

## 2023-08-09 ENCOUNTER — Encounter: Payer: Self-pay | Admitting: *Deleted

## 2023-08-09 VITALS — BP 116/64 | HR 84

## 2023-08-09 DIAGNOSIS — Z3A19 19 weeks gestation of pregnancy: Secondary | ICD-10-CM | POA: Insufficient documentation

## 2023-08-09 DIAGNOSIS — O09292 Supervision of pregnancy with other poor reproductive or obstetric history, second trimester: Secondary | ICD-10-CM

## 2023-08-09 DIAGNOSIS — Z3689 Encounter for other specified antenatal screening: Secondary | ICD-10-CM

## 2023-08-09 DIAGNOSIS — Z3480 Encounter for supervision of other normal pregnancy, unspecified trimester: Secondary | ICD-10-CM | POA: Insufficient documentation

## 2023-08-09 DIAGNOSIS — Z3A13 13 weeks gestation of pregnancy: Secondary | ICD-10-CM | POA: Diagnosis present

## 2023-08-09 DIAGNOSIS — Z363 Encounter for antenatal screening for malformations: Secondary | ICD-10-CM | POA: Diagnosis not present

## 2023-08-09 DIAGNOSIS — O09299 Supervision of pregnancy with other poor reproductive or obstetric history, unspecified trimester: Secondary | ICD-10-CM | POA: Insufficient documentation

## 2023-08-09 NOTE — Progress Notes (Signed)
 MFM Consult Note  Nicole Mcmahon is currently at 19 weeks and 0 days.  She was seen due to a history of preeclampsia in her 2 prior pregnancies.  She is currently taking 2 pills of baby aspirin  (162 mg) daily for preeclampsia prophylaxis.  She denies any problems in her current pregnancy.    She had a cell free DNA test earlier in her pregnancy which indicated a low risk for trisomy 10, 67, and 13. A female fetus is predicted.   She was informed that the fetal growth and amniotic fluid level were appropriate for her gestational age.   Mild right pyelectasis measuring 0.4 cm dilated was noted today.  The patient was reassured that this is most likely a normal variant and will most likely resolve later in her pregnancy.  The patient was informed that anomalies may be missed due to technical limitations. If the fetus is in a suboptimal position or maternal habitus is increased, visualization of the fetus in the maternal uterus may be impaired.  Due to her history of preeclampsia in her 2 prior pregnancies, she should continue taking 2 baby aspirin  pills daily for the duration of her pregnancy.    We will continue to follow her with growth ultrasounds throughout her pregnancy.    A follow-up exam was scheduled in 6 weeks.    The patient stated that all of her questions were answered today.  A total of 30 minutes was spent counseling and coordinating the care for this patient.  Greater than 50% of the time was spent in direct face-to-face contact.

## 2023-08-11 ENCOUNTER — Emergency Department (HOSPITAL_COMMUNITY)
Admission: EM | Admit: 2023-08-11 | Discharge: 2023-08-12 | Discharge status: Against Medical Advice | Payer: Medicaid Other | Attending: Emergency Medicine | Admitting: Emergency Medicine

## 2023-08-11 ENCOUNTER — Other Ambulatory Visit: Payer: Self-pay

## 2023-08-11 ENCOUNTER — Encounter (HOSPITAL_COMMUNITY): Payer: Self-pay

## 2023-08-11 DIAGNOSIS — Z3A01 Less than 8 weeks gestation of pregnancy: Secondary | ICD-10-CM | POA: Diagnosis not present

## 2023-08-11 DIAGNOSIS — O26891 Other specified pregnancy related conditions, first trimester: Secondary | ICD-10-CM | POA: Insufficient documentation

## 2023-08-11 DIAGNOSIS — R55 Syncope and collapse: Secondary | ICD-10-CM | POA: Insufficient documentation

## 2023-08-11 DIAGNOSIS — Z5321 Procedure and treatment not carried out due to patient leaving prior to being seen by health care provider: Secondary | ICD-10-CM | POA: Insufficient documentation

## 2023-08-11 LAB — URINALYSIS, ROUTINE W REFLEX MICROSCOPIC
Bilirubin Urine: NEGATIVE
Glucose, UA: NEGATIVE mg/dL
Hgb urine dipstick: NEGATIVE
Ketones, ur: NEGATIVE mg/dL
Leukocytes,Ua: NEGATIVE
Nitrite: NEGATIVE
Protein, ur: NEGATIVE mg/dL
Specific Gravity, Urine: 1.003 — ABNORMAL LOW (ref 1.005–1.030)
pH: 7 (ref 5.0–8.0)

## 2023-08-11 LAB — BASIC METABOLIC PANEL
Anion gap: 10 (ref 5–15)
BUN: 10 mg/dL (ref 6–20)
CO2: 20 mmol/L — ABNORMAL LOW (ref 22–32)
Calcium: 8.7 mg/dL — ABNORMAL LOW (ref 8.9–10.3)
Chloride: 101 mmol/L (ref 98–111)
Creatinine, Ser: 0.44 mg/dL (ref 0.44–1.00)
GFR, Estimated: >60 mL/min (ref >60)
Glucose, Bld: 71 mg/dL (ref 70–99)
Potassium: 3.2 mmol/L — ABNORMAL LOW (ref 3.5–5.1)
Sodium: 131 mmol/L — ABNORMAL LOW (ref 135–145)

## 2023-08-11 LAB — CBC
HCT: 31.9 % — ABNORMAL LOW (ref 36.0–46.0)
Hemoglobin: 10 g/dL — ABNORMAL LOW (ref 12.0–15.0)
MCH: 30.5 pg (ref 26.0–34.0)
MCHC: 31.3 g/dL (ref 30.0–36.0)
MCV: 97.3 fL (ref 80.0–100.0)
Platelets: 303 10*3/uL (ref 150–400)
RBC: 3.28 MIL/uL — ABNORMAL LOW (ref 3.87–5.11)
RDW: 12.5 % (ref 11.5–15.5)
WBC: 16.5 10*3/uL — ABNORMAL HIGH (ref 4.0–10.5)
nRBC: 0 % (ref 0.0–0.2)

## 2023-08-11 LAB — CBG MONITORING, ED
Glucose-Capillary: 81 mg/dL (ref 70–99)
Glucose-Capillary: 104 mg/dL — ABNORMAL HIGH (ref 70–99)

## 2023-08-11 LAB — HCG, SERUM, QUALITATIVE: Preg, Serum: POSITIVE — AB

## 2023-08-11 NOTE — ED Triage Notes (Signed)
 Pt is 5 months pregnant. When in grocery store, pt felt dizzy and like she was going to pass out, sat down and drank water and felt better. Did not eat today as she normally does. Denies pain or dizziness on arrival.

## 2023-08-11 NOTE — ED Notes (Signed)
 Fetal heart rate is 144

## 2023-08-11 NOTE — ED Notes (Signed)
Pt was called for vitals no response.

## 2023-08-21 ENCOUNTER — Encounter: Payer: Self-pay | Admitting: Family Medicine

## 2023-08-22 ENCOUNTER — Encounter: Payer: Self-pay | Admitting: Family Medicine

## 2023-08-22 ENCOUNTER — Ambulatory Visit: Payer: Medicaid Other | Admitting: Family Medicine

## 2023-08-22 VITALS — BP 124/76 | HR 109 | Wt 146.2 lb

## 2023-08-22 DIAGNOSIS — R062 Wheezing: Secondary | ICD-10-CM

## 2023-08-22 DIAGNOSIS — O09299 Supervision of pregnancy with other poor reproductive or obstetric history, unspecified trimester: Secondary | ICD-10-CM

## 2023-08-22 DIAGNOSIS — Z2839 Other underimmunization status: Secondary | ICD-10-CM | POA: Diagnosis not present

## 2023-08-22 DIAGNOSIS — Z3A20 20 weeks gestation of pregnancy: Secondary | ICD-10-CM

## 2023-08-22 DIAGNOSIS — O09899 Supervision of other high risk pregnancies, unspecified trimester: Secondary | ICD-10-CM

## 2023-08-22 DIAGNOSIS — Z3480 Encounter for supervision of other normal pregnancy, unspecified trimester: Secondary | ICD-10-CM | POA: Diagnosis not present

## 2023-08-22 MED ORDER — ALBUTEROL SULFATE HFA 108 (90 BASE) MCG/ACT IN AERS: 2.0000 | INHALATION_SPRAY | Freq: Four times a day (QID) | RESPIRATORY_TRACT | 2 refills | Status: DC | PRN

## 2023-08-22 MED ORDER — VENTOLIN HFA 108 (90 BASE) MCG/ACT IN AERS: 2.0000 | INHALATION_SPRAY | RESPIRATORY_TRACT | 2 refills | Status: DC | PRN

## 2023-08-22 NOTE — Progress Notes (Signed)
ROB: Has concerns about breathing, had to call EMS as she is having a hard time breathing, does have cough, stuffy nose  zyrtec helping, tylenol helping  Chest painful   O2 today in office is 100  Pt stating that EMS gave her some albuterol when they came out, Pt brought print out of EKG from EMS

## 2023-08-22 NOTE — Progress Notes (Signed)
    PRENATAL VISIT NOTE  Subjective:  Nicole Mcmahon is a 28 y.o. G5P3013 at [redacted]w[redacted]d being seen today for ongoing prenatal care.  She is currently monitored for the following issues for this low-risk pregnancy and has Rubella non-immune status, antepartum; History of preeclampsia in two previous pregnancies; Chronic migraine w/o aura w/o status migrainosus, not intractable; and Supervision of other normal pregnancy, antepartum on their problem list.  Patient reports  chest tightness and wheezing, recent URI .  Contractions: Not present. Vag. Bleeding: None.  Movement: Present. Denies leaking of fluid.   The following portions of the patient's history were reviewed and updated as appropriate: allergies, current medications, past family history, past medical history, past social history, past surgical history and problem list.   Objective:   Vitals:   08/22/23 1037  BP: 124/76  Pulse: (!) 109  Weight: 146 lb 3.2 oz (66.3 kg)    Fetal Status: Fetal Heart Rate (bpm): 148   Movement: Present     General:  Alert, oriented and cooperative. Patient is in no acute distress.  Skin: Skin is warm and dry. No rash noted.   Cardiovascular: Normal heart rate noted  Respiratory: Normal respiratory effort, no problems with respiration noted  Abdomen: Soft, gravid, appropriate for gestational age.  Pain/Pressure: Present     Pelvic: Cervical exam deferred        Extremities: Normal range of motion.  Edema: None  Mental Status: Normal mood and affect. Normal behavior. Normal judgment and thought content.   Assessment and Plan:  Pregnancy: [id number] at [redacted]w[redacted]d 1. Supervision of other normal pregnancy, antepartum (Primary) F/u anatomy US - [place] MFM OB FOLLOW UP; Future  2. Rubella non-immune status, antepartum Needs MMR pp  3. History of preeclampsia in two previous pregnancies On ASA  4. [redacted] weeks gestation of pregnancy   5. Wheezing Rx for albuterol List of meds safe in pregnancy given -  albuterol (VENTOLIN HFA) 108 (90 Base) MCG/ACT inhaler; Inhale 2 puffs into the lungs every 4 (four) hours as needed for wheezing or shortness of breath.  Dispense: 54 g; Refill: 2  Preterm labor symptoms and general obstetric precautions including but not limited to vaginal bleeding, contractions, leaking of fluid and fetal movement were reviewed in detail with the patient. Please refer to After Visit Summary for other counseling recommendations.   Return in 4 weeks (on 09/19/2023).  Future Appointments  Date Time Provider Department Center  09/05/2023 10:15 AM [facility] NURSE [facility]  09/05/2023 10:30 AM [facility] US6 [facility]  09/19/2023 10:35 AM Reva Bores, MD [facility]  10/17/2023  8:15 AM [facility] LAB [facility]  10/17/2023  8:35 AM Reva Bores, MD [facility]  10/31/2023 10:55 AM Reva Bores, MD [facility]    Reva Bores, MD

## 2023-08-29 ENCOUNTER — Encounter (HOSPITAL_COMMUNITY): Payer: Self-pay

## 2023-08-29 ENCOUNTER — Other Ambulatory Visit: Payer: Self-pay

## 2023-08-29 ENCOUNTER — Emergency Department (HOSPITAL_COMMUNITY)
Admission: EM | Admit: 2023-08-29 | Discharge: 2023-08-29 | Disposition: A | Discharge status: Home / Self Care | Payer: Medicaid Other | Attending: Emergency Medicine | Admitting: Emergency Medicine

## 2023-08-29 DIAGNOSIS — J029 Acute pharyngitis, unspecified: Secondary | ICD-10-CM | POA: Diagnosis present

## 2023-08-29 DIAGNOSIS — Z7982 Long term (current) use of aspirin: Secondary | ICD-10-CM | POA: Diagnosis not present

## 2023-08-29 DIAGNOSIS — Z3A21 21 weeks gestation of pregnancy: Secondary | ICD-10-CM | POA: Diagnosis not present

## 2023-08-29 DIAGNOSIS — J020 Streptococcal pharyngitis: Secondary | ICD-10-CM | POA: Diagnosis not present

## 2023-08-29 DIAGNOSIS — O99512 Diseases of the respiratory system complicating pregnancy, second trimester: Secondary | ICD-10-CM | POA: Diagnosis not present

## 2023-08-29 LAB — GROUP A STREP BY PCR: Group A Strep by PCR: DETECTED — AB

## 2023-08-29 MED ORDER — AMOXICILLIN 500 MG PO CAPS: 500.0000 mg | ORAL_CAPSULE | Freq: Two times a day (BID) | ORAL | 0 refills | Status: DC

## 2023-08-29 NOTE — ED Provider Notes (Signed)
 [place] EMERGENCY DEPARTMENT AT [facility] Provider Note   CSN: [id number] Arrival date & time: 08/29/23  1029     History  Chief Complaint  Patient presents with   Sore Throat    Nicole Mcmahon is a 28 y.o. female currently 21 weeks 6 days pregnant presents today for sore throat x 1 day.  Patient also endorsed right nostril bleeding 1 time yesterday.  Patient's nose is not currently bleeding.  Patient denies blood thinner.  Patient also endorses cough and recent cold.  Patient denies fever, cough, or bodyaches.  Patient has been treated for strep 2 other times since September.   Sore Throat       Home Medications Prior to Admission medications   Medication Sig Start Date End Date Taking? Authorizing Provider  amoxicillin (AMOXIL) 500 MG capsule Take 1 capsule (500 mg total) by mouth 2 (two) times daily. 08/29/23  Yes Dolphus Jenny, PA-C  albuterol (VENTOLIN HFA) 108 (90 Base) MCG/ACT inhaler Inhale 2 puffs into the lungs every 4 (four) hours as needed for wheezing or shortness of breath. 08/22/23   Reva Bores, MD  albuterol (VENTOLIN HFA) 108 (90 Base) MCG/ACT inhaler Inhale 2 puffs into the lungs every 6 (six) hours as needed for wheezing or shortness of breath. 08/22/23   Reva Bores, MD  aspirin EC 81 MG tablet Take 2 tablets (162 mg total) by mouth at bedtime. Start taking when you are [redacted] weeks pregnant for rest of pregnancy for prevention of preeclampsia 06/25/23   Anyanwu, Jethro Bastos, MD      Allergies    Patient has no known allergies.    Review of Systems   Review of Systems  Physical Exam Updated Vital Signs BP 129/70   Pulse 92   Temp 98.4 F (36.9 C) (Oral)   Resp 16   Ht 5\' 1"  (1.549 m)   Wt 64 kg   LMP 03/29/2023 (Exact Date)   SpO2 100%   BMI 26.64 kg/m  Physical Exam Constitutional:      General: She is not in acute distress.    Appearance: She is well-developed and normal weight. She is not ill-appearing, toxic-appearing or  diaphoretic.  HENT:     Head: Normocephalic and atraumatic.     Right Ear: Ear canal normal.     Left Ear: Ear canal normal.     Nose: Rhinorrhea present. No congestion.     Mouth/Throat:     Mouth: Mucous membranes are moist.     Pharynx: Uvula midline. Oropharyngeal exudate and posterior oropharyngeal erythema present. No uvula swelling.     Tonsils: Tonsillar exudate present. No tonsillar abscesses. 3+ on the right. 3+ on the left.  Eyes:     Conjunctiva/sclera: Conjunctivae normal.  Cardiovascular:     Rate and Rhythm: Normal rate and regular rhythm.     Heart sounds: Normal heart sounds.  Pulmonary:     Effort: Pulmonary effort is normal.     Breath sounds: Normal breath sounds.  Musculoskeletal:     Cervical back: Normal range of motion.  Lymphadenopathy:     Cervical: Cervical adenopathy present.  Skin:    Capillary Refill: Capillary refill takes less than 2 seconds.  Neurological:     General: No focal deficit present.  Psychiatric:        Mood and Affect: Mood normal.        Behavior: Behavior normal.     ED Results / Procedures / Treatments  Labs (all labs ordered are listed, but only abnormal results are displayed) Labs Reviewed  GROUP A STREP BY PCR - Abnormal; Notable for the following components:      Result Value   Group A Strep by PCR DETECTED (*)    All other components within normal limits    EKG None  Radiology No results found.  Procedures Procedures    Medications Ordered in ED Medications - No data to display  ED Course/ Medical Decision Making/ A&P                                 Medical Decision Making  This patient presents to the ED with chief complaint(s) of sore throat with pertinent past medical history of none which further complicates the presenting complaint. The complaint involves an extensive differential diagnosis and also carries with it a high risk of complications and morbidity.    The differential diagnosis  includes strep pharyngitis, pharyngitis, postnasal drip, PTA, epiglottitis, Ludwig's angina  Additional history obtained: Records reviewed Primary Care Documents  ED Course and Reassessment:   Independent labs interpretation:  The following labs were independently interpreted:  Strep PCR: Positive  Consultation: - Consulted or discussed management/test interpretation w/ external professional: None  Consideration for admission or further workup: Considered for mission or further workup however patient's vital signs, physical exam, and labs are reassuring.  Patient symptoms likely due to strep pharyngitis.  Patient has no red flag signs or symptoms concerning for PTA, epiglottitis, or Ludwig's angina.  There is some suspicion for patient being colonized with strep.  Patient will be given outpatient antibiotic treatment and follow-up with ENT for further evaluation and treatment.        Final Clinical Impression(s) / ED Diagnoses Final diagnoses:  Strep pharyngitis    Rx / DC Orders ED Discharge Orders          Ordered    amoxicillin (AMOXIL) 500 MG capsule  2 times daily        08/29/23 1201              Dolphus Jenny, PA-C 08/29/23 1201    Melene Plan, DO 08/29/23 1203

## 2023-08-29 NOTE — Discharge Instructions (Addendum)
 You are seen for strep pharyngitis.  Please pick up your antibiotic and take as prescribed.  Please follow-up with ENT for further evaluation and treatment.  Thank you for letting us treat you today. After reviewing your labs and performing a physical exam, I feel you are safe to go home. Please follow up with your PCP in the next several days and provide them with your records from this visit. Return to the Emergency Room if pain becomes severe or symptoms worsen.

## 2023-08-29 NOTE — ED Triage Notes (Signed)
 Patient has had a sore throat since yesterday along with right nostril bleeding. Does not take a blood thinner. Is not currently bleeding.

## 2023-08-30 ENCOUNTER — Encounter: Payer: Self-pay | Admitting: Family Medicine

## 2023-08-30 ENCOUNTER — Ambulatory Visit: Payer: Medicaid Other | Admitting: Family Medicine

## 2023-08-30 VITALS — BP 104/60 | HR 90 | Temp 97.6°F | Ht 61.0 in | Wt 148.0 lb

## 2023-08-30 DIAGNOSIS — J020 Streptococcal pharyngitis: Secondary | ICD-10-CM

## 2023-08-30 DIAGNOSIS — J351 Hypertrophy of tonsils: Secondary | ICD-10-CM | POA: Diagnosis not present

## 2023-08-30 DIAGNOSIS — Z3A23 23 weeks gestation of pregnancy: Secondary | ICD-10-CM

## 2023-08-30 NOTE — Progress Notes (Signed)
 Subjective:  Nicole Mcmahon is a 28 y.o. female who presents for acute pharyngitis, tested positive for strep throat yesterday in the ED. Started Amoxicillin last night.   States this is her 3rd time having strep throat with the last time in December.  Taking Tylenol.   Denies fever, chills, dizziness, headache, ear pain, sinus pain, chest pain, palpitations, shortness of breath, abdominal pain, N/V/D, urinary symptoms.    States she is pregnant. 6 months   [facility]. 09/19/2023   ROS as in subjective.   Objective: Vitals:   08/30/23 0800  BP: 104/60  Pulse: 90  Temp: 97.6 F (36.4 C)  SpO2: 98%    General appearance: Alert, WD/WN, no distress, well appearing                              Skin: warm, no rash                           Head: no sinus tenderness                            Eyes: conjunctiva normal, corneas clear, PERRLA                        Nose: septum midline, no drainage              Mouth/throat: MMM, tongue normal, moderate pharyngeal erythema and bilateral tonsils 3+                            Neck: supple, no adenopathy, no thyromegaly, nontender                          Heart: RRR                         Lungs: CTA bilaterally, no wheezes, rales, or rhonchi      Assessment: Recurrent streptococcal pharyngitis - Plan: Ambulatory referral to ENT  Tonsillar hypertrophy - Plan: Ambulatory referral to ENT  [redacted] weeks gestation of pregnancy   Plan: Reviewed ED notes and results.  Reports starting amoxicillin yesterday.  States this is her third case of strep throat.  Her last strep throat was in December.  The emergency department told her to follow-up with ENT.  Urgent referral placed today.  Discussed symptomatic treatment.  She is concerned about having to take antibiotics so frequently and being pregnant.  I encouraged her to reach out to her OB/GYN to discuss this.  I also recommend that she get retested for strep throat approximately 1 week  after completing her antibiotic to see if she cleared the infection.

## 2023-08-30 NOTE — Patient Instructions (Addendum)
 Complete the antibiotic prescribed in the emergency department.  Do salt water gargles.  Use throat lozenges.  Stay hydrated.  Take Tylenol as needed for pain.  I placed an urgent referral for you to see ENT.  You should be re-tested for strep throat 1 week after completing the antibiotic.   I recommend that you call your OB/GYN office and let them know what is happening and get their recommendation as well.

## 2023-08-31 ENCOUNTER — Ambulatory Visit: Payer: Medicaid Other | Admitting: Family Medicine

## 2023-09-05 ENCOUNTER — Ambulatory Visit: Payer: Medicaid Other

## 2023-09-05 ENCOUNTER — Other Ambulatory Visit: Payer: Self-pay

## 2023-09-05 ENCOUNTER — Other Ambulatory Visit: Payer: Self-pay | Admitting: *Deleted

## 2023-09-05 ENCOUNTER — Ambulatory Visit: Payer: Medicaid Other | Attending: Family Medicine

## 2023-09-05 VITALS — BP 105/50 | HR 86

## 2023-09-05 DIAGNOSIS — Z362 Encounter for other antenatal screening follow-up: Secondary | ICD-10-CM | POA: Diagnosis not present

## 2023-09-05 DIAGNOSIS — O09292 Supervision of pregnancy with other poor reproductive or obstetric history, second trimester: Secondary | ICD-10-CM | POA: Insufficient documentation

## 2023-09-05 DIAGNOSIS — Z3A22 22 weeks gestation of pregnancy: Secondary | ICD-10-CM | POA: Insufficient documentation

## 2023-09-05 DIAGNOSIS — Z3480 Encounter for supervision of other normal pregnancy, unspecified trimester: Secondary | ICD-10-CM | POA: Diagnosis not present

## 2023-09-05 DIAGNOSIS — O09299 Supervision of pregnancy with other poor reproductive or obstetric history, unspecified trimester: Secondary | ICD-10-CM

## 2023-09-05 DIAGNOSIS — O35EXX0 Maternal care for other (suspected) fetal abnormality and damage, fetal genitourinary anomalies, not applicable or unspecified: Secondary | ICD-10-CM

## 2023-09-05 DIAGNOSIS — O283 Abnormal ultrasonic finding on antenatal screening of mother: Secondary | ICD-10-CM | POA: Insufficient documentation

## 2023-09-07 ENCOUNTER — Ambulatory Visit

## 2023-09-07 DIAGNOSIS — Z3482 Encounter for supervision of other normal pregnancy, second trimester: Secondary | ICD-10-CM

## 2023-09-07 DIAGNOSIS — Z3492 Encounter for supervision of normal pregnancy, unspecified, second trimester: Secondary | ICD-10-CM

## 2023-09-07 DIAGNOSIS — Z3A22 22 weeks gestation of pregnancy: Secondary | ICD-10-CM

## 2023-09-07 NOTE — Progress Notes (Signed)
 ROB called requesting to listen to [facility] pt notes falling down a few steps in new home and is concerned.  Pt advised to use warm compresses on back and tylenol as directed.  FHT's heard 144   Pt advised if any other concerns or questions contact the office

## 2023-09-19 ENCOUNTER — Ambulatory Visit: Payer: Medicaid Other | Admitting: Family Medicine

## 2023-09-19 VITALS — BP 115/74 | HR 94 | Wt 155.0 lb

## 2023-09-19 DIAGNOSIS — Z3480 Encounter for supervision of other normal pregnancy, unspecified trimester: Secondary | ICD-10-CM

## 2023-09-19 DIAGNOSIS — O09299 Supervision of pregnancy with other poor reproductive or obstetric history, unspecified trimester: Secondary | ICD-10-CM | POA: Diagnosis not present

## 2023-09-19 DIAGNOSIS — Z2839 Other underimmunization status: Secondary | ICD-10-CM | POA: Diagnosis not present

## 2023-09-19 DIAGNOSIS — Z3A24 24 weeks gestation of pregnancy: Secondary | ICD-10-CM | POA: Diagnosis not present

## 2023-09-19 DIAGNOSIS — O09899 Supervision of other high risk pregnancies, unspecified trimester: Secondary | ICD-10-CM

## 2023-09-19 NOTE — Progress Notes (Signed)
 ROB: denies any concerns

## 2023-09-19 NOTE — Progress Notes (Signed)
   PRENATAL VISIT NOTE  Subjective:  Nicole Mcmahon is a 28 y.o. G5P3013 at [redacted]w[redacted]d being seen today for ongoing prenatal care.  She is currently monitored for the following issues for this low-risk pregnancy and has Rubella non-immune status, antepartum; History of preeclampsia in two previous pregnancies; Chronic migraine w/o aura w/o status migrainosus, not intractable; and Supervision of other normal pregnancy, antepartum on their problem list.  Patient reports no complaints.  Contractions: Not present. Vag. Bleeding: None.  Movement: Present. Denies leaking of fluid.   The following portions of the patient's history were reviewed and updated as appropriate: allergies, current medications, past family history, past medical history, past social history, past surgical history and problem list.   Objective:   Vitals:   09/19/23 1047  BP: 115/74  Pulse: 94  Weight: 155 lb (70.3 kg)    Fetal Status: Fetal Heart Rate (bpm): 137 Fundal Height: 25 cm Movement: Present     General:  Alert, oriented and cooperative. Patient is in no acute distress.  Skin: Skin is warm and dry. No rash noted.   Cardiovascular: Normal heart rate noted  Respiratory: Normal respiratory effort, no problems with respiration noted  Abdomen: Soft, gravid, appropriate for gestational age.  Pain/Pressure: Absent     Pelvic: Cervical exam deferred        Extremities: Normal range of motion.  Edema: None  Mental Status: Normal mood and affect. Normal behavior. Normal judgment and thought content.   Assessment and Plan:  Pregnancy: [id number] at [redacted]w[redacted]d 1. Supervision of other normal pregnancy, antepartum (Primary) Continue routine prenatal care.  2. Rubella non-immune status, antepartum MMR pp  3. History of preeclampsia in two previous pregnancies On ASA  4. [redacted] weeks gestation of pregnancy   Preterm labor symptoms and general obstetric precautions including but not limited to vaginal bleeding, contractions,  leaking of fluid and fetal movement were reviewed in detail with the patient. Please refer to After Visit Summary for other counseling recommendations.   Return in 3 weeks (on 10/10/2023) for [facility], 28 wk labs.  Future Appointments  Date Time Provider Department Center  10/17/2023  8:15 AM [facility] LAB [facility]  10/17/2023  8:35 AM Reva Bores, MD [facility]  10/17/2023  1:15 PM [facility] NURSE [facility]  10/17/2023  1:30 PM [facility] US4 [facility]  10/25/2023  8:30 AM [address] [facility] None  10/31/2023 10:55 AM Reva Bores, MD [facility]  11/14/2023 10:55 AM Anyanwu, Jethro Bastos, MD [facility]  11/28/2023 10:35 AM Reva Bores, MD [facility]    Reva Bores, MD

## 2023-09-21 ENCOUNTER — Other Ambulatory Visit: Payer: Self-pay

## 2023-09-21 MED ORDER — TERCONAZOLE 0.8 % VA CREA: 1.0000 | TOPICAL_CREAM | Freq: Every day | VAGINAL | 0 refills | Status: DC

## 2023-09-21 NOTE — Progress Notes (Signed)
 Rx sent per protocol for yeast sx's Made made aware in Mychart.

## 2023-10-17 ENCOUNTER — Ambulatory Visit

## 2023-10-17 ENCOUNTER — Other Ambulatory Visit: Payer: Medicaid Other

## 2023-10-17 ENCOUNTER — Ambulatory Visit: Payer: Medicaid Other | Admitting: Family Medicine

## 2023-10-17 VITALS — BP 123/85 | HR 88

## 2023-10-17 DIAGNOSIS — Z3480 Encounter for supervision of other normal pregnancy, unspecified trimester: Secondary | ICD-10-CM

## 2023-10-17 DIAGNOSIS — O09299 Supervision of pregnancy with other poor reproductive or obstetric history, unspecified trimester: Secondary | ICD-10-CM

## 2023-10-17 DIAGNOSIS — Z3A28 28 weeks gestation of pregnancy: Secondary | ICD-10-CM

## 2023-10-17 DIAGNOSIS — R55 Syncope and collapse: Secondary | ICD-10-CM

## 2023-10-17 DIAGNOSIS — O09899 Supervision of other high risk pregnancies, unspecified trimester: Secondary | ICD-10-CM

## 2023-10-17 DIAGNOSIS — Z2839 Other underimmunization status: Secondary | ICD-10-CM | POA: Diagnosis not present

## 2023-10-17 NOTE — Progress Notes (Signed)
   PRENATAL VISIT NOTE  Subjective:  Nicole Mcmahon is a 28 y.o. G5P3013 at [redacted]w[redacted]d being seen today for ongoing prenatal care.  She is currently monitored for the following issues for this low-risk pregnancy and has Rubella non-immune status, antepartum; History of preeclampsia in two previous pregnancies; Chronic migraine w/o aura w/o status migrainosus, not intractable; and Supervision of other normal pregnancy, antepartum on their problem list.  Patient reports backache and feeling lightheaded when standing for prolonged periods.  Notes no palpitations.  Contractions: Not present. Vag. Bleeding: None.  Movement: Present. Denies leaking of fluid.   The following portions of the patient's history were reviewed and updated as appropriate: allergies, current medications, past family history, past medical history, past social history, past surgical history and problem list.   Objective:   Vitals:   10/17/23 0850  BP: 123/85  Pulse: 88    Fetal Status: Fetal Heart Rate (bpm): 141 Fundal Height: 29 cm Movement: Present     General:  Alert, oriented and cooperative. Patient is in no acute distress.  Skin: Skin is warm and dry. No rash noted.   Cardiovascular: Normal heart rate noted  Respiratory: Normal respiratory effort, no problems with respiration noted  Abdomen: Soft, gravid, appropriate for gestational age.  Pain/Pressure: Absent     Pelvic: Cervical exam deferred        Extremities: Normal range of motion.  Edema: None  Mental Status: Normal mood and affect. Normal behavior. Normal judgment and thought content.   Assessment and Plan:  Pregnancy: [id number] at [redacted]w[redacted]d 1. Supervision of other normal pregnancy, antepartum (Primary) 28 week labs today Heat for back pain Declines TDaP  2. Rubella non-immune status, antepartum MMR pp  3. History of preeclampsia in two previous pregnancies Has growth scheduled  4. [redacted] weeks gestation of pregnancy   5. Near syncope Increase water  intake Compression socks  Preterm labor symptoms and general obstetric precautions including but not limited to vaginal bleeding, contractions, leaking of fluid and fetal movement were reviewed in detail with the patient. Please refer to After Visit Summary for other counseling recommendations.   Return in 2 weeks (on 10/31/2023).  Future Appointments  Date Time Provider Department Center  10/17/2023  1:00 PM [facility] PROVIDER 1 [facility]  10/17/2023  1:30 PM [facility] US4 [facility]  10/25/2023  8:30 AM [address] [facility] None  10/31/2023 10:55 AM Granville Layer, MD [facility]  11/14/2023 10:55 AM Anyanwu, Kathrine Paris, MD [facility]  11/28/2023 10:35 AM Granville Layer, MD [facility]    Granville Layer, MD

## 2023-10-18 LAB — CBC
Hematocrit: 30.6 % — ABNORMAL LOW (ref 34.0–46.6)
Hemoglobin: 9.7 g/dL — ABNORMAL LOW (ref 11.1–15.9)
MCH: 29.8 pg (ref 26.6–33.0)
MCHC: 31.7 g/dL (ref 31.5–35.7)
MCV: 94 fL (ref 79–97)
Platelets: 308 10*3/uL (ref 150–450)
RBC: 3.25 x10E6/uL — ABNORMAL LOW (ref 3.77–5.28)
RDW: 11.5 % — ABNORMAL LOW (ref 11.7–15.4)
WBC: 17.5 10*3/uL — ABNORMAL HIGH (ref 3.4–10.8)

## 2023-10-18 LAB — RPR: RPR Ser Ql: NONREACTIVE

## 2023-10-18 LAB — HIV ANTIBODY (ROUTINE TESTING W REFLEX): HIV Screen 4th Generation wRfx: NONREACTIVE

## 2023-10-18 LAB — GLUCOSE, 1 HOUR GESTATIONAL: Gestational Diabetes Screen: 70 mg/dL (ref 70–139)

## 2023-10-20 LAB — SPECIMEN STATUS REPORT

## 2023-10-20 LAB — IRON AND TIBC
Iron Saturation: 14 % — ABNORMAL LOW (ref 15–55)
Iron: 67 ug/dL (ref 27–159)
Total Iron Binding Capacity: 474 ug/dL — ABNORMAL HIGH (ref 250–450)
UIBC: 407 ug/dL (ref 131–425)

## 2023-10-20 LAB — B12 AND FOLATE PANEL
Folate: 15.7 ng/mL (ref >3.0)
Vitamin B-12: 344 pg/mL (ref 232–1245)

## 2023-10-20 LAB — FERRITIN: Ferritin: 12 ng/mL — ABNORMAL LOW (ref 15–150)

## 2023-10-22 MED ORDER — VITAMIN B-12 1000 MCG PO TABS: 1000.0000 ug | ORAL_TABLET | Freq: Every day | ORAL | 1 refills | Status: DC

## 2023-10-24 ENCOUNTER — Ambulatory Visit: Attending: Obstetrics

## 2023-10-24 ENCOUNTER — Ambulatory Visit: Admitting: Obstetrics

## 2023-10-24 ENCOUNTER — Other Ambulatory Visit: Payer: Self-pay | Admitting: *Deleted

## 2023-10-24 VITALS — BP 130/62 | HR 97

## 2023-10-24 DIAGNOSIS — Z3480 Encounter for supervision of other normal pregnancy, unspecified trimester: Secondary | ICD-10-CM

## 2023-10-24 DIAGNOSIS — Z3A29 29 weeks gestation of pregnancy: Secondary | ICD-10-CM | POA: Diagnosis not present

## 2023-10-24 DIAGNOSIS — Z362 Encounter for other antenatal screening follow-up: Secondary | ICD-10-CM | POA: Insufficient documentation

## 2023-10-24 DIAGNOSIS — O35EXX0 Maternal care for other (suspected) fetal abnormality and damage, fetal genitourinary anomalies, not applicable or unspecified: Secondary | ICD-10-CM

## 2023-10-24 DIAGNOSIS — Z8759 Personal history of other complications of pregnancy, childbirth and the puerperium: Secondary | ICD-10-CM | POA: Insufficient documentation

## 2023-10-24 DIAGNOSIS — O24419 Gestational diabetes mellitus in pregnancy, unspecified control: Secondary | ICD-10-CM

## 2023-10-24 DIAGNOSIS — Z3A33 33 weeks gestation of pregnancy: Secondary | ICD-10-CM | POA: Insufficient documentation

## 2023-10-24 DIAGNOSIS — O09293 Supervision of pregnancy with other poor reproductive or obstetric history, third trimester: Secondary | ICD-10-CM | POA: Insufficient documentation

## 2023-10-24 DIAGNOSIS — O09299 Supervision of pregnancy with other poor reproductive or obstetric history, unspecified trimester: Secondary | ICD-10-CM | POA: Diagnosis present

## 2023-10-25 ENCOUNTER — Institutional Professional Consult (permissible substitution) (INDEPENDENT_AMBULATORY_CARE_PROVIDER_SITE_OTHER): Payer: Medicaid Other

## 2023-10-25 NOTE — Progress Notes (Deleted)
 Dear Dr. Maree Shames, Here is my assessment for our mutual patient, Nicole Mcmahon. Thank you for allowing me the opportunity to care for your patient. Please do not hesitate to contact me should you have any other questions. Sincerely, Dr. Milon Aloe  Otolaryngology Clinic Note Referring provider: Dr. Maree Shames HPI:  Nicole Mcmahon is a 28 y.o. female kindly referred by Dr. Maree Shames for evaluation of ***.   H&N Surgery: *** Personal or FHx of bleeding dz or anesthesia difficulty: no ***  GLP-1: *** AP/AC: ***  Tobacco: ***. Alcohol: ***. Occupation: ***. Lives in *** with ***.  PMHx: Migraines  Independent Review of Additional Tests or Records:  [name] (08/30/2023) FM: b/l tonsillar hypertrophy, third case of strep throat; referred to ENT. Currently pregnant Pharyngitis episodes: 08/29/2023, 06/28/2023, 03/29/2023 (all ED visits) - see chart CBC 10/17/2023: WBC 17.5, Hgb 9.7 Strep positive x2:   PMH/Meds/All/SocHx/FamHx/ROS:   Past Medical History:  Diagnosis Date   Anemia    Asthma    Cervical dysplasia 09/04/2018   [ ]  rpt pap July 2023     July 2022: ascus  March 2020: LSIL        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    Preeclampsia 05/10/2017   Postpartum   Sickle cell trait (HCC)      Past Surgical History:  Procedure Laterality Date   NO PAST SURGERIES      Family History  Problem Relation Age of Onset   Cancer Paternal Grandmother    Lupus Paternal Grandmother    Cancer Paternal Aunt      Social Connections: Not on file      Current Outpatient Medications:    albuterol  (VENTOLIN  HFA) 108 (90 Base) MCG/ACT inhaler, Inhale 2 puffs into the lungs every 4 (four) hours as needed for wheezing or shortness of  breath., Disp: 54 g, Rfl: 2   albuterol  (VENTOLIN  HFA) 108 (90 Base) MCG/ACT inhaler, Inhale 2 puffs into the lungs every 6 (six) hours as needed for wheezing or shortness of breath., Disp: 54 g, Rfl: 2   aspirin  EC 81 MG tablet, Take 2 tablets (162 mg total) by mouth at bedtime. Start taking when you are [redacted] weeks pregnant for rest of pregnancy for prevention of preeclampsia, Disp: 300 tablet, Rfl: 2   cyanocobalamin (VITAMIN B12) 1000 MCG tablet, Take 1 tablet (1,000 mcg total) by mouth daily., Disp: 90 tablet, Rfl: 1   folic acid (FOLVITE) 400 MCG tablet, Take 400 mcg by mouth daily., Disp: , Rfl:    Physical Exam:   LMP 03/29/2023 (Exact Date)   Salient findings:  CN II-XII intact *** Bilateral EAC clear and TM intact with well pneumatized middle ear spaces Weber 512: *** Rinne 512: AC > BC b/l *** Rine 1024: AC > BC b/l *** Anterior rhinoscopy: Septum ***; bilateral inferior turbinates with *** No lesions of oral cavity/oropharynx; dentition *** No obviously palpable neck masses/lymphadenopathy/thyromegaly No respiratory distress or stridor***  Seprately Identifiable Procedures:  Prior to initiating any procedures, risks/benefits/alternatives were explained to the patient and verbal consent obtained. None***  Impression & Plans:  [name] is a 28 y.o. female with ***  No diagnosis found.   - f/u ***  See below regarding exact medications  prescribed this encounter including dosages and route: No orders of the defined types were placed in this encounter.     Thank you for allowing me the opportunity to care for your patient. Please do not hesitate to contact me should you have any other questions.  Sincerely, Milon Aloe, MD Otolaryngologist (ENT), [facility] Phone: [phone] Fax: [phone]  10/25/2023, 7:32 AM   MDM:  Level *** Complexity/Problems addressed: *** Data complexity: *** independent review of *** - Morbidity: ***  -  Prescription Drug prescribed or managed: ***

## 2023-10-31 ENCOUNTER — Ambulatory Visit: Payer: Medicaid Other | Admitting: Family Medicine

## 2023-10-31 VITALS — BP 114/74 | HR 108 | Wt 169.0 lb

## 2023-10-31 DIAGNOSIS — Z2839 Other underimmunization status: Secondary | ICD-10-CM

## 2023-10-31 DIAGNOSIS — Z3A30 30 weeks gestation of pregnancy: Secondary | ICD-10-CM | POA: Diagnosis not present

## 2023-10-31 DIAGNOSIS — O99013 Anemia complicating pregnancy, third trimester: Secondary | ICD-10-CM | POA: Insufficient documentation

## 2023-10-31 DIAGNOSIS — O09299 Supervision of pregnancy with other poor reproductive or obstetric history, unspecified trimester: Secondary | ICD-10-CM | POA: Diagnosis not present

## 2023-10-31 DIAGNOSIS — D508 Other iron deficiency anemias: Secondary | ICD-10-CM

## 2023-10-31 DIAGNOSIS — D509 Iron deficiency anemia, unspecified: Secondary | ICD-10-CM | POA: Insufficient documentation

## 2023-10-31 DIAGNOSIS — O09899 Supervision of other high risk pregnancies, unspecified trimester: Secondary | ICD-10-CM | POA: Diagnosis not present

## 2023-10-31 DIAGNOSIS — Z3480 Encounter for supervision of other normal pregnancy, unspecified trimester: Secondary | ICD-10-CM

## 2023-10-31 HISTORY — DX: Iron deficiency anemia, unspecified: D50.9

## 2023-10-31 MED ORDER — FERROUS SULFATE 325 (65 FE) MG PO TBEC: 325.0000 mg | DELAYED_RELEASE_TABLET | ORAL | 3 refills | Status: DC

## 2023-10-31 NOTE — Progress Notes (Signed)
    PRENATAL VISIT NOTE  Subjective:  Nicole Mcmahon is a 28 y.o. G5P3013 at [redacted]w[redacted]d being seen today for ongoing prenatal care.  She is currently monitored for the following issues for this low-risk pregnancy and has Rubella non-immune status, antepartum; History of preeclampsia in two previous pregnancies; Chronic migraine w/o aura w/o status migrainosus, not intractable; Supervision of other normal pregnancy, antepartum; and Iron  deficiency anemia on their problem list.  Patient reports  shortness of breath when climbing stairs .  Contractions: Irregular. Vag. Bleeding: None.  Movement: Present. Denies leaking of fluid.   The following portions of the patient's history were reviewed and updated as appropriate: allergies, current medications, past family history, past medical history, past social history, past surgical history and problem list.   Objective:   Vitals:   10/31/23 1122  BP: 114/74  Pulse: (!) 108  Weight: 169 lb (76.7 kg)    Fetal Status: Fetal Heart Rate (bpm): 153 Fundal Height: 31 cm Movement: Present     General:  Alert, oriented and cooperative. Patient is in no acute distress.  Skin: Skin is warm and dry. No rash noted.   Cardiovascular: Normal heart rate noted  Respiratory: Normal respiratory effort, no problems with respiration noted  Abdomen: Soft, gravid, appropriate for gestational age.  Pain/Pressure: Present     Pelvic: Cervical exam deferred        Extremities: Normal range of motion.  Edema: None  Mental Status: Normal mood and affect. Normal behavior. Normal judgment and thought content.   Assessment and Plan:  Pregnancy: [id number] at [redacted]w[redacted]d 1. Supervision of other normal pregnancy, antepartum (Primary) Continue routine prenatal care.  2. Rubella non-immune status, antepartum MMR pp  3. History of preeclampsia in two previous pregnancies On ASA  4. [redacted] weeks gestation of pregnancy   5. Other iron  deficiency anemia Repeat iron  studies next  visit May contribute to SOB Rx for iron  sent in.  Preterm labor symptoms and general obstetric precautions including but not limited to vaginal bleeding, contractions, leaking of fluid and fetal movement were reviewed in detail with the patient. Please refer to After Visit Summary for other counseling recommendations.   Return in 2 weeks (on 11/14/2023).  Future Appointments  Date Time Provider Department Center  11/14/2023 10:55 AM Anyanwu, Kathrine Paris, MD [facility]  11/23/2023  9:00 AM [facility] PROVIDER 1 [facility]  11/23/2023  9:30 AM [facility] US5 [facility]  11/28/2023 10:35 AM Granville Layer, MD [facility]  12/05/2023 10:35 AM Raynell Caller, MD [facility]  12/12/2023 10:35 AM Tari Fare, CNM [facility]  12/19/2023 10:55 AM Raynell Caller, MD [facility]  12/26/2023 10:55 AM Tari Fare, CNM [facility]    Granville Layer, MD

## 2023-10-31 NOTE — Progress Notes (Signed)
 ROB: Denies any concerns

## 2023-11-14 ENCOUNTER — Encounter: Payer: Self-pay | Admitting: Obstetrics & Gynecology

## 2023-11-14 ENCOUNTER — Ambulatory Visit (INDEPENDENT_AMBULATORY_CARE_PROVIDER_SITE_OTHER): Admitting: Obstetrics & Gynecology

## 2023-11-14 VITALS — BP 125/77 | HR 120 | Wt 167.0 lb

## 2023-11-14 DIAGNOSIS — O283 Abnormal ultrasonic finding on antenatal screening of mother: Secondary | ICD-10-CM | POA: Insufficient documentation

## 2023-11-14 DIAGNOSIS — Z3A32 32 weeks gestation of pregnancy: Secondary | ICD-10-CM | POA: Diagnosis not present

## 2023-11-14 DIAGNOSIS — O09299 Supervision of pregnancy with other poor reproductive or obstetric history, unspecified trimester: Secondary | ICD-10-CM

## 2023-11-14 DIAGNOSIS — Z3480 Encounter for supervision of other normal pregnancy, unspecified trimester: Secondary | ICD-10-CM | POA: Diagnosis not present

## 2023-11-14 DIAGNOSIS — O99013 Anemia complicating pregnancy, third trimester: Secondary | ICD-10-CM

## 2023-11-14 NOTE — Patient Instructions (Signed)

## 2023-11-14 NOTE — Progress Notes (Signed)
 PRENATAL VISIT NOTE  Subjective:  Nicole Mcmahon is a 28 y.o. G5P3013 at [redacted]w[redacted]d being seen today for ongoing prenatal care.  She is currently monitored for the following issues for this low-risk pregnancy and has Rubella non-immune status, antepartum; History of preeclampsia in two previous pregnancies; Chronic migraine w/o aura w/o status migrainosus, not intractable; Supervision of other normal pregnancy, antepartum; Iron  deficiency anemia; and Pyelectasis of fetus on prenatal ultrasound on their problem list.  Patient reports Braxton-Hicks contractions and pelvic pressure sometimes.  Contractions: Irritability. Vag. Bleeding: None.  Movement: Present. Denies leaking of fluid.   The following portions of the patient's history were reviewed and updated as appropriate: allergies, current medications, past family history, past medical history, past social history, past surgical history and problem list.   Objective:    Vitals:   11/14/23 1114  BP: 125/77  Pulse: (!) 120  Weight: 167 lb (75.8 kg)    Fetal Status:  Fetal Heart Rate (bpm): 148   Movement: Present    General: Alert, oriented and cooperative. Patient is in no acute distress.  Skin: Skin is warm and dry. No rash noted.   Cardiovascular: Normal heart rate noted  Respiratory: Normal respiratory effort, no problems with respiration noted  Abdomen: Soft, gravid, appropriate for gestational age.  Pain/Pressure: Present     Pelvic: Cervical exam deferred        Extremities: Normal range of motion.     Mental Status: Normal mood and affect. Normal behavior. Normal judgment and thought content.   US  MFM OB FOLLOW UP Result Date: 10/24/2023 ----------------------------------------------------------------------  OBSTETRICS REPORT                       (Signed Final 10/24/2023 02:00 pm) ---------------------------------------------------------------------- Patient Info  ID #:       [id number]                          D.O.B.:  Jan 15, 1996  (27 yrs)(F)  Name:       Nicole Mcmahon                  Visit Date: 10/24/2023 01:17 pm ---------------------------------------------------------------------- Performed By  Attending:        Jodeen Munch      Ref. Address:     [address]                    MD                                                             [place], [place]                                                             [address]  Performed By:     Joyann Allen RDMS      Location:         [facility]  Referred By:      Julianne Octave MD ---------------------------------------------------------------------- Orders  #  Description                           Code        Ordered By  1  US  MFM OB FOLLOW UP                   [address].19    Elgin Grit ----------------------------------------------------------------------  #  Order #                     Accession #                Episode #  1  [id number]                   [id number]                 [id number] ---------------------------------------------------------------------- Indications  Poor obstetric history: Previous preeclampsia  O09.299  Pyelectasis of fetus on prenatal ultrasound    O28.3  Encounter for other antenatal screening        Z36.2  follow-up  LR NIPS, Negative Horizon  [redacted] weeks gestation of pregnancy                Z3A.29 ---------------------------------------------------------------------- Vital Signs  BP:          130/62 ---------------------------------------------------------------------- Fetal Evaluation  Num Of Fetuses:         1  Fetal Heart Rate(bpm):  133  Cardiac Activity:       Observed  Presentation:           Cephalic  Placenta:               Posterior  P. Cord Insertion:      Visualized  Amniotic Fluid  AFI FV:      Within normal  limits  AFI Sum(cm)     %Tile       Largest Pocket(cm)  14.19           47          4.54  RUQ(cm)       RLQ(cm)       LUQ(cm)        LLQ(cm)  4.54          3.06          2.27           4.32 ---------------------------------------------------------------------- Biometry  BPD:      73.2  mm     G. Age:  12w 3d  23  %    CI:        74.83   %    70 - 86                                                          FL/HC:      20.5   %    19.2 - 21.4  HC:      268.5  mm     G. Age:  29w 2d          7  %    HC/AC:      1.07        0.99 - 1.21  AC:      251.7  mm     G. Age:  29w 3d         30  %    FL/BPD:     75.1   %    71 - 87  FL:         55  mm     G. Age:  29w 0d         15  %    FL/AC:      21.9   %    20 - 24  Est. FW:    1360  gm           3 lb     19  % ---------------------------------------------------------------------- OB History  Gravidity:    5         Term:   3        Prem:   0        SAB:   1  TOP:          0       Ectopic:  0        Living: 3 ---------------------------------------------------------------------- Gestational Age  LMP:           29w 6d        Date:  03/29/23                  EDD:   01/03/24  U/S Today:     29w 2d                                        EDD:   01/07/24  Best:          29w 6d     Det. By:  LMP  (03/29/23)          EDD:   01/03/24 ---------------------------------------------------------------------- Anatomy  Cranium:               Previously seen        Aortic Arch:            Previously seen  Cavum:                 Previously seen        Ductal Arch:            Previously seen  Ventricles:            Appears normal         Diaphragm:  Appears normal  Choroid Plexus:        Previously seen        Stomach:                Appears normal, left                                                                        sided  Cerebellum:            Previously seen        Abdomen:                Previously seen  Posterior Fossa:       Previously seen        Abdominal Wall:          Previously seen  Face:                  Orbits and profile     Cord Vessels:           Previously seen                         previously seen  Lips:                  Previously seen        Kidneys:                Right sided                                                                        pyelectasis .68cm  Thoracic:              Previously seen        Bladder:                Appears normal  Heart:                 Appears normal         Spine:                  Previously seen                         (4CH, axis, and                         situs)  RVOT:                  Previously seen        Upper Extremities:      Previously seen  LVOT:                  Previously seen        Lower Extremities:      Previously seen ---------------------------------------------------------------------- Cervix Uterus Adnexa  Cervix  Not visualized (advanced GA >24wks)  Uterus  No  abnormality visualized.  Right Ovary  Not visualized.  Left Ovary  Not visualized.  Cul De Sac  No free fluid seen.  Adnexa  No abnormality visualized ---------------------------------------------------------------------- Impression  Follow up growth due to assess renal pyelecatsis (resolved  today)  Normal interval growth with measurements consistent with  dates  Good fetal movement and amniotic fluid volume ---------------------------------------------------------------------- Recommendations  Follow up growth in 6 weeks given the UTD is near 7 mm. ----------------------------------------------------------------------              Jodeen Munch, MD Electronically Signed Final Report   10/24/2023 02:00 pm ----------------------------------------------------------------------   Assessment and Plan:  Pregnancy: [id number] at [redacted]w[redacted]d 1. Anemia of pregnancy in third trimester (Primary) On iron , folate and B12. Will check labs today. - Anemia Profile B  2. Pyelectasis of fetus on prenatal ultrasound Follow up imaging scheduled next week, will  follow up results and manage accordingly.  3. History of preeclampsia in two previous pregnancies Stable BP. Continue ASA.  4. [redacted] weeks gestation of pregnancy 5. Supervision of other normal pregnancy, antepartum No other concerns. Preterm labor symptoms and general obstetric precautions including but not limited to vaginal bleeding, contractions, leaking of fluid and fetal movement were reviewed in detail with the patient. Please refer to After Visit Summary for other counseling recommendations.   Return in about 2 weeks (around 11/28/2023) for OFFICE OB VISIT (MD only).  Future Appointments  Date Time Provider Department Center  11/23/2023  9:00 AM [facility] PROVIDER 1 [facility]  11/23/2023  9:30 AM [facility] US5 [facility]  11/28/2023 10:35 AM Granville Layer, MD [facility]  12/05/2023 10:35 AM Raynell Caller, MD [facility]  12/12/2023 10:35 AM Tari Fare, CNM [facility]  12/19/2023 10:55 AM Raynell Caller, MD [facility]  12/26/2023 10:55 AM Tari Fare, CNM [facility]    Lenoard Rad, MD

## 2023-11-15 ENCOUNTER — Telehealth: Payer: Self-pay

## 2023-11-15 ENCOUNTER — Ambulatory Visit: Payer: Self-pay | Admitting: Obstetrics & Gynecology

## 2023-11-15 ENCOUNTER — Encounter: Payer: Self-pay | Admitting: Obstetrics & Gynecology

## 2023-11-15 DIAGNOSIS — O99013 Anemia complicating pregnancy, third trimester: Secondary | ICD-10-CM

## 2023-11-15 LAB — ANEMIA PROFILE B
Basophils Absolute: 0.1 10*3/uL (ref 0.0–0.2)
Basos: 0 %
EOS (ABSOLUTE): 0.4 10*3/uL (ref 0.0–0.4)
Eos: 3 %
Ferritin: 10 ng/mL — ABNORMAL LOW (ref 15–150)
Folate: >20 ng/mL (ref >3.0)
Hematocrit: 28.5 % — ABNORMAL LOW (ref 34.0–46.6)
Hemoglobin: 9.1 g/dL — ABNORMAL LOW (ref 11.1–15.9)
Immature Grans (Abs): 0 10*3/uL (ref 0.0–0.1)
Immature Granulocytes: 0 %
Iron Saturation: 14 % — ABNORMAL LOW (ref 15–55)
Iron: 66 ug/dL (ref 27–159)
Lymphocytes Absolute: 2.6 10*3/uL (ref 0.7–3.1)
Lymphs: 17 %
MCH: 29 pg (ref 26.6–33.0)
MCHC: 31.9 g/dL (ref 31.5–35.7)
MCV: 91 fL (ref 79–97)
Monocytes Absolute: 0.8 10*3/uL (ref 0.1–0.9)
Monocytes: 6 %
Neutrophils Absolute: 11 10*3/uL — ABNORMAL HIGH (ref 1.4–7.0)
Neutrophils: 74 %
Platelets: 294 10*3/uL (ref 150–450)
RBC: 3.14 x10E6/uL — ABNORMAL LOW (ref 3.77–5.28)
RDW: 12 % (ref 11.7–15.4)
Retic Ct Pct: 2.4 % (ref 0.6–2.6)
Total Iron Binding Capacity: 465 ug/dL — ABNORMAL HIGH (ref 250–450)
UIBC: 399 ug/dL (ref 131–425)
Vitamin B-12: 453 pg/mL (ref 232–1245)
WBC: 14.8 10*3/uL — ABNORMAL HIGH (ref 3.4–10.8)

## 2023-11-15 NOTE — Progress Notes (Signed)
 Patient has significant iron  deficiency anemia, has not improved on oral iron  therapy.  Normal folate and B12 levels. IV iron  infusion recommended; this was ordered for patient. Please call to inform patient of results and recommendations.   Lenoard Rad, MD

## 2023-11-15 NOTE — Telephone Encounter (Signed)
 Dr. Thurmon [place], patient will be scheduled as soon as possible.  Auth Submission: NO AUTH NEEDED Site of care: Site of care: [facility] Payer: [place] [employer] medicaid Medication & CPT/J Code(s) submitted: Venofer (Iron  Sucrose) J1756 Route of submission (phone, fax, portal):  Phone # Fax # Auth type: Buy/Bill PB Units/visits requested: 300mg  x 3 doses Reference number:  Approval from: 11/15/23 to 03/17/24

## 2023-11-16 ENCOUNTER — Ambulatory Visit

## 2023-11-16 VITALS — BP 116/67 | HR 95 | Temp 97.7°F | Resp 16 | Ht 61.0 in | Wt 163.0 lb

## 2023-11-16 DIAGNOSIS — Z3A33 33 weeks gestation of pregnancy: Secondary | ICD-10-CM | POA: Diagnosis not present

## 2023-11-16 DIAGNOSIS — O99013 Anemia complicating pregnancy, third trimester: Secondary | ICD-10-CM

## 2023-11-16 DIAGNOSIS — D508 Other iron deficiency anemias: Secondary | ICD-10-CM | POA: Diagnosis not present

## 2023-11-16 MED ORDER — SODIUM CHLORIDE 0.9 % IV SOLN
300.0000 mg | INTRAVENOUS | Status: DC
  Administered 2023-11-16: 300 mg via INTRAVENOUS
  Filled 2023-11-16: qty 15

## 2023-11-16 NOTE — Progress Notes (Signed)
 Diagnosis: Iron  Deficiency Anemia  Provider:  Mannam, Praveen MD  Procedure: IV Infusion  IV Type: Peripheral, IV Location: L Forearm  Venofer (Iron  Sucrose), Dose: 300 mg  Infusion Start Time: 0949 am  Infusion Stop Time: 1155  Post Infusion IV Care: Observation period completed and Peripheral IV Discontinued  Discharge: Condition: Good, Destination: Home . AVS Declined  Performed by:  Mayme Spearman, RN

## 2023-11-23 ENCOUNTER — Other Ambulatory Visit: Payer: Self-pay | Admitting: *Deleted

## 2023-11-23 ENCOUNTER — Ambulatory Visit (INDEPENDENT_AMBULATORY_CARE_PROVIDER_SITE_OTHER)

## 2023-11-23 ENCOUNTER — Ambulatory Visit: Attending: Maternal & Fetal Medicine

## 2023-11-23 ENCOUNTER — Ambulatory Visit (HOSPITAL_BASED_OUTPATIENT_CLINIC_OR_DEPARTMENT_OTHER): Admitting: Obstetrics

## 2023-11-23 VITALS — BP 128/76 | HR 120 | Temp 97.9°F | Resp 21 | Ht 61.0 in | Wt 167.2 lb

## 2023-11-23 DIAGNOSIS — O283 Abnormal ultrasonic finding on antenatal screening of mother: Secondary | ICD-10-CM | POA: Diagnosis not present

## 2023-11-23 DIAGNOSIS — O358XX0 Maternal care for other (suspected) fetal abnormality and damage, not applicable or unspecified: Secondary | ICD-10-CM | POA: Diagnosis not present

## 2023-11-23 DIAGNOSIS — O365930 Maternal care for other known or suspected poor fetal growth, third trimester, not applicable or unspecified: Secondary | ICD-10-CM | POA: Diagnosis not present

## 2023-11-23 DIAGNOSIS — O403XX0 Polyhydramnios, third trimester, not applicable or unspecified: Secondary | ICD-10-CM | POA: Diagnosis not present

## 2023-11-23 DIAGNOSIS — O35EXX0 Maternal care for other (suspected) fetal abnormality and damage, fetal genitourinary anomalies, not applicable or unspecified: Secondary | ICD-10-CM

## 2023-11-23 DIAGNOSIS — Z3A34 34 weeks gestation of pregnancy: Secondary | ICD-10-CM | POA: Diagnosis not present

## 2023-11-23 DIAGNOSIS — O99013 Anemia complicating pregnancy, third trimester: Secondary | ICD-10-CM | POA: Diagnosis not present

## 2023-11-23 DIAGNOSIS — D508 Other iron deficiency anemias: Secondary | ICD-10-CM

## 2023-11-23 DIAGNOSIS — O09293 Supervision of pregnancy with other poor reproductive or obstetric history, third trimester: Secondary | ICD-10-CM

## 2023-11-23 MED ORDER — SODIUM CHLORIDE 0.9 % IV SOLN
300.0000 mg | INTRAVENOUS | Status: DC
  Administered 2023-11-23: 300 mg via INTRAVENOUS
  Filled 2023-11-23: qty 15

## 2023-11-23 NOTE — Progress Notes (Signed)
 Diagnosis: Iron  Deficiency Anemia  Provider:  Praveen Mannam MD  Procedure: IV Infusion  IV Type: Peripheral, IV Location: L Forearm  Venofer  (Iron  Sucrose), Dose: 300 mg  Infusion Start Time: 1133  Infusion Stop Time: 1320  Post Infusion IV Care: Observation period completed and Peripheral IV Discontinued  Discharge: Condition: Good, Destination: Home . AVS Declined  Performed by:  Velencia Lenart, RN

## 2023-11-23 NOTE — Progress Notes (Signed)
 MFM Consult Note  Nicole Mcmahon is currently at 34 weeks and 1 day.  She has been followed due to pyelectasis noted on her prior ultrasound exams.    She denies any problems since her last exam and has screened negative for gestational diabetes in her current pregnancy.  On today's exam, the overall EFW of 4 pounds 9 ounces measures at the 12th percentile for her gestational age.    Polyhydramnios with a total AFI of 25.01 cm was noted today.    The right renal pelvis was 0.7 cm dilated today.  The patient was reassured that this is most likely a normal finding at her current gestational age.    She was advised that most cases of pyelectasis noted on prenatal ultrasounds will resolve after birth.    She was advised to notify her pediatricians regarding the pyelectasis that has been noted during her prenatal ultrasound exams.  Her pediatrician will order additional imaging studies of the baby's kidneys if necessary.  Due to the low normal EFW noted today, a follow-up growth scan was scheduled in 3 weeks.    The patient understands that should the overall EFW be less than the 10th percentile at her next growth scan in 3 weeks, delivery may be recommended at between 37 to 38 weeks.    She stated that all of her questions were answered today.  A total of 20 minutes was spent counseling and coordinating the care for this patient.  Greater than 50% of the time was spent in direct face-to-face contact.

## 2023-11-23 NOTE — Progress Notes (Signed)
 See US  report, consult not indicated

## 2023-11-28 ENCOUNTER — Ambulatory Visit: Admitting: Family Medicine

## 2023-11-28 VITALS — BP 121/75 | HR 91 | Wt 173.0 lb

## 2023-11-28 DIAGNOSIS — O09899 Supervision of other high risk pregnancies, unspecified trimester: Secondary | ICD-10-CM | POA: Diagnosis not present

## 2023-11-28 DIAGNOSIS — O99013 Anemia complicating pregnancy, third trimester: Secondary | ICD-10-CM | POA: Diagnosis not present

## 2023-11-28 DIAGNOSIS — Z3480 Encounter for supervision of other normal pregnancy, unspecified trimester: Secondary | ICD-10-CM

## 2023-11-28 DIAGNOSIS — D509 Iron deficiency anemia, unspecified: Secondary | ICD-10-CM | POA: Diagnosis not present

## 2023-11-28 DIAGNOSIS — Z3A34 34 weeks gestation of pregnancy: Secondary | ICD-10-CM

## 2023-11-28 DIAGNOSIS — Z2839 Other underimmunization status: Secondary | ICD-10-CM | POA: Diagnosis not present

## 2023-11-28 NOTE — Progress Notes (Signed)
 ROB: denies any concerns

## 2023-11-28 NOTE — Progress Notes (Signed)
   PRENATAL VISIT NOTE  Subjective:  Nicole Mcmahon is a 28 y.o. G5P3013 at [redacted]w[redacted]d being seen today for ongoing prenatal care.  She is currently monitored for the following issues for this low-risk pregnancy and has Rubella non-immune status, antepartum; History of preeclampsia in two previous pregnancies; Chronic migraine w/o aura w/o status migrainosus, not intractable; Supervision of other normal pregnancy, antepartum; Maternal iron  deficiency anemia complicating pregnancy in third trimester; and Pyelectasis of fetus on prenatal ultrasound on their problem list.  Patient reports no complaints.  Contractions: Irregular. Vag. Bleeding: None.  Movement: Present. Denies leaking of fluid.   The following portions of the patient's history were reviewed and updated as appropriate: allergies, current medications, past family history, past medical history, past social history, past surgical history and problem list.   Objective:    Vitals:   11/28/23 1018  BP: 121/75  Pulse: 91  Weight: 173 lb (78.5 kg)    Fetal Status:  Fetal Heart Rate (bpm): 128 Fundal Height: 34 cm Movement: Present    General: Alert, oriented and cooperative. Patient is in no acute distress.  Skin: Skin is warm and dry. No rash noted.   Cardiovascular: Normal heart rate noted  Respiratory: Normal respiratory effort, no problems with respiration noted  Abdomen: Soft, gravid, appropriate for gestational age.  Pain/Pressure: Absent     Pelvic: Cervical exam deferred        Extremities: Normal range of motion.  Edema: None  Mental Status: Normal mood and affect. Normal behavior. Normal judgment and thought content.   Assessment and Plan:  Pregnancy: [id number] at [redacted]w[redacted]d 1. Supervision of other normal pregnancy, antepartum (Primary) Last u/s showed EFW @ 13%, mild poly For f/u growth in 3 weeks, scheduled Cultures next visit.  2. Rubella non-immune status, antepartum MMR pp  3. Maternal iron  deficiency anemia  complicating pregnancy in third trimester Getting iron  infusions Repeat CBC in 3 weeks  4. [redacted] weeks gestation of pregnancy   Preterm labor symptoms and general obstetric precautions including but not limited to vaginal bleeding, contractions, leaking of fluid and fetal movement were reviewed in detail with the patient. Please refer to After Visit Summary for other counseling recommendations.   Return in 2 weeks (on 12/12/2023).  Future Appointments  Date Time Provider Department Center  11/30/2023 11:15 AM [facility] None  12/05/2023 10:35 AM Raynell Caller, MD [facility]  12/13/2023 10:55 AM Constant, Carles Cheadle, MD [facility]  12/19/2023 10:55 AM Raynell Caller, MD [facility]  12/27/2023 10:35 AM Raynell Caller, MD [facility]    Granville Layer, MD

## 2023-11-30 ENCOUNTER — Ambulatory Visit

## 2023-11-30 VITALS — BP 116/73 | HR 90 | Temp 98.7°F | Resp 16 | Ht 61.0 in | Wt 169.4 lb

## 2023-11-30 DIAGNOSIS — O99013 Anemia complicating pregnancy, third trimester: Secondary | ICD-10-CM | POA: Diagnosis not present

## 2023-11-30 DIAGNOSIS — Z3A35 35 weeks gestation of pregnancy: Secondary | ICD-10-CM

## 2023-11-30 DIAGNOSIS — D509 Iron deficiency anemia, unspecified: Secondary | ICD-10-CM

## 2023-11-30 DIAGNOSIS — D508 Other iron deficiency anemias: Secondary | ICD-10-CM

## 2023-11-30 MED ORDER — SODIUM CHLORIDE 0.9 % IV SOLN
300.0000 mg | INTRAVENOUS | Status: DC
  Administered 2023-11-30: 300 mg via INTRAVENOUS
  Filled 2023-11-30: qty 15

## 2023-11-30 NOTE — Progress Notes (Signed)
 Diagnosis: Acute Anemia  Provider:  Phyllis Breeze MD  Procedure: IV Infusion  IV Type: Peripheral, IV Location: R Forearm  Venofer  (Iron  Sucrose), Dose: 300 mg  Infusion Start Time: 1151  Infusion Stop Time: 1332  Post Infusion IV Care: Observation period completed and Peripheral IV Discontinued  Discharge: Condition: Good, Destination: Home . AVS Declined  Performed by:  Shirly Dow, RN

## 2023-12-05 ENCOUNTER — Ambulatory Visit (INDEPENDENT_AMBULATORY_CARE_PROVIDER_SITE_OTHER): Admitting: Obstetrics and Gynecology

## 2023-12-05 VITALS — BP 118/73 | HR 101 | Wt 173.0 lb

## 2023-12-05 DIAGNOSIS — D509 Iron deficiency anemia, unspecified: Secondary | ICD-10-CM

## 2023-12-05 DIAGNOSIS — Z3480 Encounter for supervision of other normal pregnancy, unspecified trimester: Secondary | ICD-10-CM

## 2023-12-05 DIAGNOSIS — O283 Abnormal ultrasonic finding on antenatal screening of mother: Secondary | ICD-10-CM | POA: Diagnosis not present

## 2023-12-05 DIAGNOSIS — O09299 Supervision of pregnancy with other poor reproductive or obstetric history, unspecified trimester: Secondary | ICD-10-CM

## 2023-12-05 DIAGNOSIS — O99013 Anemia complicating pregnancy, third trimester: Secondary | ICD-10-CM | POA: Diagnosis not present

## 2023-12-05 DIAGNOSIS — O365930 Maternal care for other known or suspected poor fetal growth, third trimester, not applicable or unspecified: Secondary | ICD-10-CM | POA: Diagnosis not present

## 2023-12-05 DIAGNOSIS — Z3A35 35 weeks gestation of pregnancy: Secondary | ICD-10-CM

## 2023-12-05 DIAGNOSIS — N760 Acute vaginitis: Secondary | ICD-10-CM | POA: Diagnosis not present

## 2023-12-05 DIAGNOSIS — O403XX0 Polyhydramnios, third trimester, not applicable or unspecified: Secondary | ICD-10-CM | POA: Diagnosis not present

## 2023-12-05 NOTE — Progress Notes (Signed)
 ROB   CC: Vaginal Burning on the outside,peeling, no discharge, no odor.  *Using african net sponge.

## 2023-12-05 NOTE — Progress Notes (Signed)
   PRENATAL VISIT NOTE  Subjective:  Nicole Mcmahon is a 28 y.o. G5P3013 at [redacted]w[redacted]d being seen today for ongoing prenatal care.  She is currently monitored for the following issues for this high-risk pregnancy and has Rubella non-immune status, antepartum; History of preeclampsia in two previous pregnancies; Chronic migraine w/o aura w/o status migrainosus, not intractable; Supervision of other normal pregnancy, antepartum; Maternal iron  deficiency anemia complicating pregnancy in third trimester; Pyelectasis of fetus on prenatal ultrasound; Polyhydramnios in third trimester; and Recurrent vaginitis on their problem list.  Patient reports recurrent vaginitis s/s. Pt endorses episodes in and out of pregnancy where she has chills.  Contractions: Not present. Vag. Bleeding: None.  Movement: Present. Denies leaking of fluid.   The following portions of the patient's history were reviewed and updated as appropriate: allergies, current medications, past family history, past medical history, past social history, past surgical history and problem list.   Objective:    Vitals:   12/05/23 1105  BP: 118/73  Pulse: (!) 101  Weight: 173 lb (78.5 kg)    Fetal Status:  Fetal Heart Rate (bpm): 144   Movement: Present    General: Alert, oriented and cooperative. Patient is in no acute distress.  Skin: Skin is warm and dry. No rash noted.   Cardiovascular: Normal heart rate noted  Respiratory: Normal respiratory effort, no problems with respiration noted  Abdomen: Soft, gravid, appropriate for gestational age.  Pain/Pressure: Present     Pelvic: Cervical exam deferred        Extremities: Normal range of motion.  Edema: None  Mental Status: Normal mood and affect. Normal behavior. Normal judgment and thought content.   Assessment and Plan:  Pregnancy: G5P3013 at [redacted]w[redacted]d 1. [redacted] weeks gestation of pregnancy (Primary) Needs GBS next visit Declines exam today. Self swab done. Self swabs per Dr. Thurmon [place] -  Mycoplasma / ureaplasma culture - NuSwab Vaginitis Plus (VG+)  2. Recurrent vaginitis - Mycoplasma / ureaplasma culture - NuSwab Vaginitis Plus (VG+)  3. Polyhydramnios in third trimester complication, single or unspecified fetus Rpt u/s with mfm not already; front desk asked to call them to schedule.  5/23: efw 12%, 2056g, ac 13%, afi 25.01, mvp 7.8; rt 7mm S/p normal GTT and low risk panorama  4. Pyelectasis of fetus on prenatal ultrasound Let peds know at delivery Borderline on the right at 7mm on 5/23.   5. History of preeclampsia in two previous pregnancies Confirms on low dose ASA  6. Maternal iron  deficiency anemia complicating pregnancy in third trimester S/p IV iron  900mg , last dose on 5/30 5/30: venofer  300; 5/23 300, 5/16 300  7. Poor fetal growth See above.   Preterm labor symptoms and general obstetric precautions including but not limited to vaginal bleeding, contractions, leaking of fluid and fetal movement were reviewed in detail with the patient. Please refer to After Visit Summary for other counseling recommendations.   No follow-ups on file.  Future Appointments  Date Time Provider Department Center  12/13/2023 10:55 AM Constant, Carles Cheadle, MD [facility]  12/18/2023 10:00 AM [facility] PROVIDER 1 [facility]  12/18/2023 10:30 AM [facility] US6 [facility]  12/19/2023 10:55 AM Raynell Caller, MD [facility]  12/27/2023 10:35 AM Raynell Caller, MD [facility]    Raynell Caller, MD

## 2023-12-07 LAB — NUSWAB VAGINITIS PLUS (VG+)
Candida albicans, NAA: NEGATIVE
Candida glabrata, NAA: NEGATIVE
Chlamydia trachomatis, NAA: NEGATIVE
Neisseria gonorrhoeae, NAA: NEGATIVE
Trich vag by NAA: NEGATIVE

## 2023-12-07 LAB — MYCOPLASMA / UREAPLASMA CULTURE

## 2023-12-12 ENCOUNTER — Encounter: Admitting: Certified Nurse Midwife

## 2023-12-13 ENCOUNTER — Ambulatory Visit (INDEPENDENT_AMBULATORY_CARE_PROVIDER_SITE_OTHER): Admitting: Obstetrics and Gynecology

## 2023-12-13 VITALS — BP 112/72 | HR 86 | Wt 173.0 lb

## 2023-12-13 DIAGNOSIS — O99013 Anemia complicating pregnancy, third trimester: Secondary | ICD-10-CM

## 2023-12-13 DIAGNOSIS — O365930 Maternal care for other known or suspected poor fetal growth, third trimester, not applicable or unspecified: Secondary | ICD-10-CM | POA: Diagnosis not present

## 2023-12-13 DIAGNOSIS — O09299 Supervision of pregnancy with other poor reproductive or obstetric history, unspecified trimester: Secondary | ICD-10-CM

## 2023-12-13 DIAGNOSIS — Z3480 Encounter for supervision of other normal pregnancy, unspecified trimester: Secondary | ICD-10-CM | POA: Diagnosis not present

## 2023-12-13 DIAGNOSIS — Z3A37 37 weeks gestation of pregnancy: Secondary | ICD-10-CM

## 2023-12-13 DIAGNOSIS — D509 Iron deficiency anemia, unspecified: Secondary | ICD-10-CM

## 2023-12-13 DIAGNOSIS — O283 Abnormal ultrasonic finding on antenatal screening of mother: Secondary | ICD-10-CM

## 2023-12-13 NOTE — Progress Notes (Signed)
 ROB  GBS today.  Cervix check.  CC: None

## 2023-12-13 NOTE — Progress Notes (Signed)
   PRENATAL VISIT NOTE  Subjective:  Nicole Mcmahon is a 28 y.o. G5P3013 at [redacted]w[redacted]d being seen today for ongoing prenatal care.  She is currently monitored for the following issues for this high-risk pregnancy and has Rubella non-immune status, antepartum; History of preeclampsia in two previous pregnancies; Chronic migraine w/o aura w/o status migrainosus, not intractable; Supervision of other normal pregnancy, antepartum; Maternal iron  deficiency anemia complicating pregnancy in third trimester; Pyelectasis of fetus on prenatal ultrasound; Polyhydramnios in third trimester; Recurrent vaginitis; and Poor fetal growth affecting management of mother in third trimester on their problem list.  Patient reports no complaints.  Contractions: Irritability. Vag. Bleeding: None.  Movement: Present. Denies leaking of fluid.   The following portions of the patient's history were reviewed and updated as appropriate: allergies, current medications, past family history, past medical history, past social history, past surgical history and problem list.   Objective:    Vitals:   12/13/23 1113  BP: 112/72  Pulse: 86  Weight: 173 lb (78.5 kg)    Fetal Status:  Fetal Heart Rate (bpm): 136 Fundal Height: 36 cm Movement: Present    General: Alert, oriented and cooperative. Patient is in no acute distress.  Skin: Skin is warm and dry. No rash noted.   Cardiovascular: Normal heart rate noted  Respiratory: Normal respiratory effort, no problems with respiration noted  Abdomen: Soft, gravid, appropriate for gestational age.  Pain/Pressure: Present     Pelvic: Cervical exam performed in the presence of a chaperone Dilation: 1 Effacement (%): 50 Station: -3  Extremities: Normal range of motion.     Mental Status: Normal mood and affect. Normal behavior. Normal judgment and thought content.   Assessment and Plan:  Pregnancy: [id number] at [redacted]w[redacted]d 1. Supervision of other normal pregnancy, antepartum (Primary) Patient  is doing well without complaints GBS culture today Patient undecided on contraception  2. History of preeclampsia in two previous pregnancies Continue ASA  3. Poor fetal growth affecting management of mother in third trimester, single or unspecified fetus Follow up growth ultrasound on 6/17 Timing of delivery pending EFW  4. Maternal iron  deficiency anemia complicating pregnancy in third trimester S/p venofer   5. Pyelectasis of fetus on prenatal ultrasound Follow up postnatal  Term labor symptoms and general obstetric precautions including but not limited to vaginal bleeding, contractions, leaking of fluid and fetal movement were reviewed in detail with the patient. Please refer to After Visit Summary for other counseling recommendations.   Return in about 1 week (around 12/20/2023) for in person, ROB, High risk.  Future Appointments  Date Time Provider Department Center  12/18/2023 10:00 AM [facility] PROVIDER 1 [facility]  12/18/2023 10:30 AM [facility] US1 [facility]  12/19/2023 10:55 AM Raynell Caller, MD [facility]  12/27/2023 10:35 AM Raynell Caller, MD [facility]    Verlyn Goad, MD

## 2023-12-16 LAB — CULTURE, BETA STREP (GROUP B ONLY): Strep Gp B Culture: POSITIVE — AB

## 2023-12-17 ENCOUNTER — Encounter: Payer: Self-pay | Admitting: Obstetrics and Gynecology

## 2023-12-17 DIAGNOSIS — O99820 Streptococcus B carrier state complicating pregnancy: Secondary | ICD-10-CM | POA: Insufficient documentation

## 2023-12-18 ENCOUNTER — Ambulatory Visit: Attending: Obstetrics

## 2023-12-18 ENCOUNTER — Ambulatory Visit: Admitting: Maternal & Fetal Medicine

## 2023-12-18 VITALS — BP 119/59 | HR 83

## 2023-12-18 DIAGNOSIS — Z3A37 37 weeks gestation of pregnancy: Secondary | ICD-10-CM | POA: Insufficient documentation

## 2023-12-18 DIAGNOSIS — O365930 Maternal care for other known or suspected poor fetal growth, third trimester, not applicable or unspecified: Secondary | ICD-10-CM

## 2023-12-18 DIAGNOSIS — O09293 Supervision of pregnancy with other poor reproductive or obstetric history, third trimester: Secondary | ICD-10-CM | POA: Insufficient documentation

## 2023-12-18 DIAGNOSIS — O283 Abnormal ultrasonic finding on antenatal screening of mother: Secondary | ICD-10-CM | POA: Diagnosis not present

## 2023-12-18 DIAGNOSIS — O35EXX0 Maternal care for other (suspected) fetal abnormality and damage, fetal genitourinary anomalies, not applicable or unspecified: Secondary | ICD-10-CM | POA: Diagnosis not present

## 2023-12-18 DIAGNOSIS — Z3480 Encounter for supervision of other normal pregnancy, unspecified trimester: Secondary | ICD-10-CM

## 2023-12-18 DIAGNOSIS — Z362 Encounter for other antenatal screening follow-up: Secondary | ICD-10-CM | POA: Diagnosis not present

## 2023-12-18 DIAGNOSIS — O403XX0 Polyhydramnios, third trimester, not applicable or unspecified: Secondary | ICD-10-CM | POA: Diagnosis not present

## 2023-12-18 DIAGNOSIS — Z8759 Personal history of other complications of pregnancy, childbirth and the puerperium: Secondary | ICD-10-CM | POA: Insufficient documentation

## 2023-12-18 NOTE — Progress Notes (Signed)
 Patient information  Patient Name: Nicole Mcmahon  Patient MRN:   [id number]  Referring practice: MFM Referring Provider: [facility]  Problem List   Patient Active Problem List   Diagnosis Date Noted   GBS (group B Streptococcus carrier), +RV culture, currently pregnant 12/17/2023   Polyhydramnios in third trimester 12/05/2023   Recurrent vaginitis 12/05/2023   Poor fetal growth affecting management of mother in third trimester 12/05/2023   Pyelectasis of fetus on prenatal ultrasound 11/14/2023   Maternal iron  deficiency anemia complicating pregnancy in third trimester 10/31/2023   Supervision of other normal pregnancy, antepartum 05/15/2023   Chronic migraine w/o aura w/o status migrainosus, not intractable 08/01/2021   History of preeclampsia in two previous pregnancies 05/10/2017   Rubella non-immune status, antepartum 01/22/2017   Maternal Fetal medicine Consult  Nicole Mcmahon is a 28 y.o. [id number] at [redacted]w[redacted]d here for ultrasound and consultation. Nicole Mcmahon is doing well today with no acute concerns. Today we focused on the following:   I discussed that the previous indications for ultrasound are no longer present including fetal pyelectasis, poor fetal growth and polyhydramnios.  For this reason no future ultrasounds are needed at this time.  Since the fetal growth has been stable this likely represents constitutionally normal fetal growth and an early delivery is not indicated.  The patient had time to ask questions that were answered to her satisfaction.  She verbalized understanding and agrees to proceed with the plan below.  Sonographic findings Single intrauterine pregnancy at 37w 5d.  Fetal cardiac activity:  Observed and appears normal. Presentation: Cephalic. Interval fetal anatomy appears normal. Fetal biometry shows the estimated fetal weight at the 14 percentile. Amniotic fluid volume: Within normal limits. MVP: 5.97 cm. Placenta:  Posterior.  There are limitations of prenatal ultrasound such as the inability to detect certain abnormalities due to poor visualization. Various factors such as fetal position, gestational age and maternal body habitus may increase the difficulty in visualizing the fetal anatomy.    Recommendations -No further ultrasounds are recommended at this time based on the current indications. If future indications arise (e.g. size/date discrepancy on fundal height, gestational diabetes or hypertension) and an ultrasound is to be desired at our MFM office, please send a referral.   Review of Systems: A review of systems was performed and was negative except per HPI   Vitals and Physical Exam    12/18/2023   10:22 AM 12/13/2023   11:13 AM 12/05/2023   11:05 AM  Vitals with BMI  Weight  173 lbs 173 lbs  BMI  32.7 32.7  Systolic 119 112 147  Diastolic 59 72 73  Pulse 83 86 101    Sitting comfortably on the sonogram table Nonlabored breathing Normal rate and rhythm Abdomen is nontender  Past pregnancies OB History  Gravida Para Term Preterm AB Living  5 3 3  1 3   SAB IAB Ectopic Multiple Live Births  1   0 3    # Outcome Date GA Lbr Len/2nd Weight Sex Type Anes PTL Lv  5 Current           4 SAB 11/22/22 [redacted]w[redacted]d         3 Term 08/12/21 [redacted]w[redacted]d 06:14 / 00:37 8 lb 1.8 oz (3.68 kg) M Vag-Vacuum EPI  LIV  2 Term 03/14/19 [redacted]w[redacted]d 11:37 / 01:47 8 lb 2.2 oz (3.691 kg) M Vag-Spont EPI  LIV  1 Term 05/04/17 [redacted]w[redacted]d 23:05 / 00:32 6 lb 3 oz (  2.807 kg) M Vag-Spont EPI  LIV     I spent 20 minutes reviewing the patients chart, including labs and images as well as counseling the patient about her medical conditions. Greater than 50% of the time was spent in direct face-to-face patient counseling.  Penney Bowling  MFM, [facility]   12/18/2023  10:56 AM

## 2023-12-19 ENCOUNTER — Ambulatory Visit (INDEPENDENT_AMBULATORY_CARE_PROVIDER_SITE_OTHER): Admitting: Obstetrics and Gynecology

## 2023-12-19 VITALS — BP 116/73 | HR 93 | Wt 173.0 lb

## 2023-12-19 DIAGNOSIS — Z3A37 37 weeks gestation of pregnancy: Secondary | ICD-10-CM

## 2023-12-19 DIAGNOSIS — O283 Abnormal ultrasonic finding on antenatal screening of mother: Secondary | ICD-10-CM | POA: Diagnosis not present

## 2023-12-19 DIAGNOSIS — D509 Iron deficiency anemia, unspecified: Secondary | ICD-10-CM | POA: Diagnosis not present

## 2023-12-19 DIAGNOSIS — O99013 Anemia complicating pregnancy, third trimester: Secondary | ICD-10-CM

## 2023-12-19 DIAGNOSIS — O365930 Maternal care for other known or suspected poor fetal growth, third trimester, not applicable or unspecified: Secondary | ICD-10-CM

## 2023-12-19 DIAGNOSIS — O09299 Supervision of pregnancy with other poor reproductive or obstetric history, unspecified trimester: Secondary | ICD-10-CM | POA: Diagnosis not present

## 2023-12-19 DIAGNOSIS — O99820 Streptococcus B carrier state complicating pregnancy: Secondary | ICD-10-CM | POA: Diagnosis not present

## 2023-12-19 NOTE — Progress Notes (Signed)
   PRENATAL VISIT NOTE  Subjective:  Kingston Guiles is a 28 y.o. G5P3013 at [redacted]w[redacted]d being seen today for ongoing prenatal care.  She is currently monitored for the following issues for this high-risk pregnancy and has Rubella non-immune status, antepartum; History of preeclampsia in two previous pregnancies; Chronic migraine w/o aura w/o status migrainosus, not intractable; Supervision of other normal pregnancy, antepartum; Maternal iron  deficiency anemia complicating pregnancy in third trimester; Pyelectasis of fetus on prenatal ultrasound; Recurrent vaginitis; Poor fetal growth affecting management of mother in third trimester; and GBS (group B Streptococcus carrier), +RV culture, currently pregnant on their problem list.  Patient reports no complaints.  Contractions: Irritability. Vag. Bleeding: None.  Movement: Present. Denies leaking of fluid.   The following portions of the patient's history were reviewed and updated as appropriate: allergies, current medications, past family history, past medical history, past social history, past surgical history and problem list.   Objective:    Vitals:   12/19/23 1121  BP: 116/73  Pulse: 93  Weight: 173 lb (78.5 kg)    Fetal Status:  Fetal Heart Rate (bpm): 140 Fundal Height: 37 cm Movement: Present Presentation: Vertex  General: Alert, oriented and cooperative. Patient is in no acute distress.  Skin: Skin is warm and dry. No rash noted.   Cardiovascular: Normal heart rate noted  Respiratory: Normal respiratory effort, no problems with respiration noted  Abdomen: Soft, gravid, appropriate for gestational age.  Pain/Pressure: Present     Pelvic: Cervical exam performed in the presence of a chaperone Dilation: 1.5 Effacement (%): 20 Station: Ballotable. Can stretch to 2cm  Extremities: Normal range of motion.  Edema: None  Mental Status: Normal mood and affect. Normal behavior. Normal judgment and thought content.   Assessment and Plan:   Pregnancy: G5P3013 at [redacted]w[redacted]d 1. [redacted] weeks gestation of pregnancy (Primary)  2. GBS (group B Streptococcus carrier), +RV culture, currently pregnant D/w her re:  3. Poor fetal growth affecting management of mother in third trimester, single or unspecified fetus Stable. No repeat scans recommended 6/17: efw 14%, 2743g, ac 30%, afi 21, 8/8, ceph 5/23: 12%, ac 13%  4. History of preeclampsia in two previous pregnancies  5. Pyelectasis of fetus on prenatal ultrasound Resolved Appeared normal at last u/s on 6/17  6. Maternal iron  deficiency anemia complicating pregnancy in third trimester S/p iv iron   Term labor symptoms and general obstetric precautions including but not limited to vaginal bleeding, contractions, leaking of fluid and fetal movement were reviewed in detail with the patient. Please refer to After Visit Summary for other counseling recommendations.   No follow-ups on file.  Future Appointments  Date Time Provider Department Center  12/27/2023 10:35 AM Raynell Caller, MD [facility]    Raynell Caller, MD

## 2023-12-19 NOTE — Progress Notes (Signed)
 ROB   CC: Yellowish discharge may have lost mucous plug.

## 2023-12-25 ENCOUNTER — Ambulatory Visit: Admitting: Family Medicine

## 2023-12-25 VITALS — BP 127/79 | HR 85 | Wt 175.0 lb

## 2023-12-25 DIAGNOSIS — O99820 Streptococcus B carrier state complicating pregnancy: Secondary | ICD-10-CM

## 2023-12-25 DIAGNOSIS — Z3480 Encounter for supervision of other normal pregnancy, unspecified trimester: Secondary | ICD-10-CM

## 2023-12-25 DIAGNOSIS — O99013 Anemia complicating pregnancy, third trimester: Secondary | ICD-10-CM

## 2023-12-25 DIAGNOSIS — O09899 Supervision of other high risk pregnancies, unspecified trimester: Secondary | ICD-10-CM | POA: Diagnosis not present

## 2023-12-25 DIAGNOSIS — Z3A38 38 weeks gestation of pregnancy: Secondary | ICD-10-CM

## 2023-12-25 DIAGNOSIS — D509 Iron deficiency anemia, unspecified: Secondary | ICD-10-CM

## 2023-12-25 DIAGNOSIS — O09299 Supervision of pregnancy with other poor reproductive or obstetric history, unspecified trimester: Secondary | ICD-10-CM

## 2023-12-25 DIAGNOSIS — O365930 Maternal care for other known or suspected poor fetal growth, third trimester, not applicable or unspecified: Secondary | ICD-10-CM

## 2023-12-25 DIAGNOSIS — Z2839 Other underimmunization status: Secondary | ICD-10-CM | POA: Diagnosis not present

## 2023-12-25 NOTE — Progress Notes (Signed)
    PRENATAL VISIT NOTE  Subjective:  Nicole Mcmahon is a 28 y.o. G5P3013 at [redacted]w[redacted]d being seen today for ongoing prenatal care.  She is currently monitored for the following issues for this high-risk pregnancy and has Rubella non-immune status, antepartum; History of preeclampsia in two previous pregnancies; Chronic migraine w/o aura w/o status migrainosus, not intractable; Supervision of other normal pregnancy, antepartum; Maternal iron  deficiency anemia complicating pregnancy in third trimester; Recurrent vaginitis; Poor fetal growth affecting management of mother in third trimester; and GBS (group B Streptococcus carrier), +RV culture, currently pregnant on their problem list.  Patient reports no complaints.  Contractions: Irritability. Vag. Bleeding: None.  Movement: Present. Denies leaking of fluid.   The following portions of the patient's history were reviewed and updated as appropriate: allergies, current medications, past family history, past medical history, past social history, past surgical history and problem list.   Objective:    Vitals:   12/25/23 1542  BP: 127/79  Pulse: 85  Weight: 175 lb (79.4 kg)    Fetal Status:  Fetal Heart Rate (bpm): 143 Fundal Height: 34 cm Movement: Present Presentation: Vertex  General: Alert, oriented and cooperative. Patient is in no acute distress.  Skin: Skin is warm and dry. No rash noted.   Cardiovascular: Normal heart rate noted  Respiratory: Normal respiratory effort, no problems with respiration noted  Abdomen: Soft, gravid, appropriate for gestational age.  Pain/Pressure: Present     Pelvic: Cervical exam performed in the presence of a chaperone Dilation: 3.5 Effacement (%): 20    Extremities: Normal range of motion.  Edema: None  Mental Status: Normal mood and affect. Normal behavior. Normal judgment and thought content.   Assessment and Plan:  Pregnancy: [id number] at [redacted]w[redacted]d 1. Supervision of other normal pregnancy, antepartum  (Primary) Continue routine prenatal care.   2. Rubella non-immune status, antepartum MMR pp  3. Poor fetal growth affecting management of mother in third trimester, single or unspecified fetus Improved to 16%  4. Maternal iron  deficiency anemia complicating pregnancy in third trimester On po repletion  5. GBS (group B Streptococcus carrier), +RV culture, currently pregnant Will need treatment in labor  6. History of preeclampsia in two previous pregnancies Normal BP for now  7. [redacted] weeks gestation of pregnancy   Term labor symptoms and general obstetric precautions including but not limited to vaginal bleeding, contractions, leaking of fluid and fetal movement were reviewed in detail with the patient. Please refer to After Visit Summary for other counseling recommendations.   Return in 1 week (on 01/01/2024).  Future Appointments  Date Time Provider Department Center  01/03/2024  2:30 PM Herchel Gloris LABOR, MD [facility]  01/10/2024  6:45 AM [facility] SCHED ROOM [facility] None    Glenys GORMAN Birk, MD

## 2023-12-25 NOTE — Progress Notes (Signed)
 ROB

## 2023-12-26 ENCOUNTER — Encounter: Admitting: Certified Nurse Midwife

## 2023-12-27 ENCOUNTER — Encounter: Admitting: Obstetrics and Gynecology

## 2024-01-03 ENCOUNTER — Other Ambulatory Visit (INDEPENDENT_AMBULATORY_CARE_PROVIDER_SITE_OTHER): Payer: Self-pay

## 2024-01-03 ENCOUNTER — Ambulatory Visit: Admitting: Obstetrics & Gynecology

## 2024-01-03 VITALS — BP 128/79 | HR 106 | Wt 177.8 lb

## 2024-01-03 DIAGNOSIS — O09299 Supervision of pregnancy with other poor reproductive or obstetric history, unspecified trimester: Secondary | ICD-10-CM | POA: Diagnosis not present

## 2024-01-03 DIAGNOSIS — Z3480 Encounter for supervision of other normal pregnancy, unspecified trimester: Secondary | ICD-10-CM | POA: Diagnosis not present

## 2024-01-03 DIAGNOSIS — O365930 Maternal care for other known or suspected poor fetal growth, third trimester, not applicable or unspecified: Secondary | ICD-10-CM

## 2024-01-03 DIAGNOSIS — O480 Post-term pregnancy: Secondary | ICD-10-CM

## 2024-01-03 DIAGNOSIS — Z3A40 40 weeks gestation of pregnancy: Secondary | ICD-10-CM

## 2024-01-03 NOTE — Progress Notes (Signed)
 ROB: Denies any concerns

## 2024-01-03 NOTE — Progress Notes (Signed)
   PRENATAL VISIT NOTE  Subjective:  Nicole Mcmahon is a 28 y.o. G5P3013 at [redacted]w[redacted]d being seen today for ongoing prenatal care.  She is currently monitored for the following issues for this low-risk pregnancy and has Rubella non-immune status, antepartum; History of preeclampsia in two previous pregnancies; Chronic migraine w/o aura w/o status migrainosus, not intractable; Supervision of other normal pregnancy, antepartum; Maternal iron  deficiency anemia complicating pregnancy in third trimester; Recurrent vaginitis; Poor fetal growth affecting management of mother in third trimester; and GBS (group B Streptococcus carrier), +RV culture, currently pregnant on their problem list.  Patient reports occasional contractions.  Contractions: Irregular. Vag. Bleeding: None.  Movement: Present. Denies leaking of fluid.   The following portions of the patient's history were reviewed and updated as appropriate: allergies, current medications, past family history, past medical history, past social history, past surgical history and problem list.   Objective:    Vitals:   01/03/24 1442  BP: 128/79  Pulse: (!) 106  Weight: 177 lb 12.8 oz (80.6 kg)    Fetal Status:  Fetal Heart Rate (bpm): 140   Movement: Present Presentation: Vertex  General: Alert, oriented and cooperative. Patient is in no acute distress.  Skin: Skin is warm and dry. No rash noted.   Cardiovascular: Normal heart rate noted  Respiratory: Normal respiratory effort, no problems with respiration noted  Abdomen: Soft, gravid, appropriate for gestational age.  Pain/Pressure: Present     Pelvic: Cervical exam performed in the presence of a chaperone Dilation: 4 Effacement (%): 50 Station: Ballotable  Extremities: Normal range of motion.  Edema: None  Mental Status: Normal mood and affect. Normal behavior. Normal judgment and thought content.   Assessment and Plan:  Pregnancy: [id number] at [redacted]w[redacted]d 1. Poor fetal growth affecting management of  mother in third trimester, single or unspecified fetus (Primary) 6/17 EFW 14%  2. History of preeclampsia in two previous pregnancies Stable BP, continue to monitor  3. Post-term pregnancy, 40 weeks of gestation 4. Supervision of other normal pregnancy, antepartum - US  FETAL BPP W/NONSTRESS; Future: NST performed today was reviewed and was found to be reactive. Subsequent BPP performed today was also reviewed and was found to be 10/10. AFI was also normal.  Scheduled for IOL at 41 weeks - 01/10/24.  Labor symptoms and general obstetric precautions including but not limited to vaginal bleeding, contractions, leaking of fluid and fetal movement were reviewed in detail with the patient. Please refer to After Visit Summary for other counseling recommendations.   Return for Postpartum check.  Future Appointments  Date Time Provider Department Center  01/10/2024  6:45 AM [facility] SCHED ROOM [facility] None    Gloris Hugger, MD

## 2024-01-03 NOTE — Patient Instructions (Signed)
  Things to Try After 37 weeks for Cervical Ripening (to get your cervix ready for labor) : May try one or all:     Try the [employer] at [email or web address].[email or web address] daily to improve baby's position and encourage the onset of labor.  Cervical Ripening: May try one or both Red Raspberry Leaf capsules or tea:  two 300mg  or 400mg  tablets with each meal, 2-3 times a day, or 1-3 cups of tea daily  Potential Side Effects Of Raspberry Leaf:  Most women do not experience any side effects from drinking raspberry leaf tea. However, nausea and loose stools are possible   Evening Primrose Oil capsules: take 1 capsule by mouth and place one capsule in the vagina every night.    Some of the potential side effects:  Upset stomach  Loose stools or diarrhea  Headaches  Nausea    3.  6 Dates a day (may taste better if warmed in microwave until soft). Found where raisins are in the grocery store   Return to office for any scheduled appointments. Call the office or go to the MAU at [facility] if: You begin to have strong, frequent contractions Your water breaks.  Sometimes it is a big gush of fluid, sometimes it is just a trickle that keeps getting your underwear wet or running down your legs You have vaginal bleeding.  It is normal to have a small amount of spotting if your cervix was checked.  You do not feel your baby moving like normal.  If you do not, get something to eat and drink and lay down and focus on feeling your baby move.   If your baby is still not moving like normal, you should call the office or go to MAU. Any other obstetric concerns.

## 2024-01-08 ENCOUNTER — Encounter (HOSPITAL_COMMUNITY): Payer: Self-pay | Admitting: *Deleted

## 2024-01-08 ENCOUNTER — Telehealth (HOSPITAL_COMMUNITY): Payer: Self-pay | Admitting: *Deleted

## 2024-01-08 NOTE — Telephone Encounter (Signed)
 Preadmission screen

## 2024-01-10 ENCOUNTER — Inpatient Hospital Stay (HOSPITAL_COMMUNITY)

## 2024-01-10 NOTE — H&P (Signed)
 OBSTETRIC ADMISSION HISTORY AND PHYSICAL  Nicole Mcmahon is a 28 y.o. female [phone] with IUP at [redacted]w[redacted]d by LMP presenting for PDIOL. She reports +FMs, No LOF, no VB, no blurry vision, headaches or peripheral edema, and RUQ pain.  She plans on breast feeding. She request nothing for birth control. She received her prenatal care at [facility]   Dating: By LMP --->  Estimated Date of Delivery: 01/03/24  Sono:   @[redacted]w[redacted]d , CWD, normal anatomy, cephalic presentation, posterior placental lie, 2743g, 14% EFW   Prenatal History/Complications:  Patient Active Problem List   Diagnosis Date Noted   Encounter for induction of labor 01/11/2024   GBS (group B Streptococcus carrier), +RV culture, currently pregnant 12/17/2023   Recurrent vaginitis 12/05/2023   Poor fetal growth affecting management of mother in third trimester 12/05/2023   Maternal iron  deficiency anemia complicating pregnancy in third trimester 10/31/2023   Supervision of other normal pregnancy, antepartum 05/15/2023   Chronic migraine w/o aura w/o status migrainosus, not intractable 08/01/2021   History of preeclampsia in two previous pregnancies 05/10/2017   Rubella non-immune status, antepartum 01/22/2017   NURSING  PROVIDER  Office Location [place] Dating by LMP c/w U/S at 6 wks  [facility] Model Traditional Anatomy U/S Incomplete  Initiated care at  [facility]  English               LAB RESULTS   Support Person FOB Genetics NIPS: LR F AFP:       Carrier Screen Horizon: Negative in 2018 and 2020  Rhogam  O/Positive/-- (12/23 1520) A1C/GTT Early: A1C 4.7 Third trimester: 70 (1 hour only)  Flu Vaccine Declines       TDaP Vaccine  Declines  Blood Type O/Positive/-- (12/23 1520)  Covid Vaccine None  Antibody Negative (12/23 1520)  RSV Vaccine   Rubella <0.90 (12/23 1520)non immune  Feeding Plan Breast  RPR Non Reactive (12/23 1520)  Contraception None  HBsAg Negative (12/23 1520)  Circumcision NA HIV Non Reactive (12/23  1520)  Pediatrician  [place] [place] HCVAb Non Reactive (12/23 1520)  Prenatal Classes            Pap [ ]  needs pp pap  BTL Consent   GC/CT Initial:   36wks:    VBAC Consent   GBS  Positive For PCN allergy, check sensitivities            DME Rx [ ]  BP cuff [ ]  Weight Scale Waterbirth  [ ]  Class [ ]  Consent [ ]  CNM visit  PHQ9 & GAD7 [  ] new OB [  ] 28 weeks  [  ] 36 weeks Induction  [ ]  Orders Entered [ ] Foley Y/N     Past Medical History: Past Medical History:  Diagnosis Date   Anemia    Asthma    Cervical dysplasia 09/04/2018   [ ]  rpt pap July 2023     July 2022: ascus  March 2020: LSIL        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    History of pre-eclampsia in prior pregnancy, currently pregnant    Iron  deficiency anemia 10/31/2023  Preeclampsia 05/10/2017   Postpartum   Sickle cell trait (HCC)     Past Surgical History: Past Surgical History:  Procedure Laterality Date   NO PAST SURGERIES      Obstetrical History: OB History     Gravida  5   Para  3   Term  3   Preterm      AB  1   Living  3      SAB  1   IAB      Ectopic      Multiple  0   Live Births  3           Social History Social History   Socioeconomic History   Marital status: Married    Spouse name: Not on file   Number of children: Not on file   Years of education: Not on file   Highest education level: Not on file  Occupational History   Not on file  Tobacco Use   Smoking status: Never   Smokeless tobacco: Never  Vaping Use   Vaping status: Never Used  Substance and Sexual Activity   Alcohol use: No   Drug use: No   Sexual activity: Yes    Partners: Male    Birth control/protection: None  Other Topics Concern    Not on file  Social History Narrative   Not on file   Social Drivers of Health   Financial Resource Strain: Not on file  Food Insecurity: Not on file  Transportation Needs: Not on file  Physical Activity: Not on file  Stress: Not on file  Social Connections: Not on file    Family History: Family History  Problem Relation Age of Onset   Cancer Paternal Grandmother    Lupus Paternal Grandmother    Cancer Paternal Aunt     Allergies: No Known Allergies  Medications Prior to Admission  Medication Sig Dispense Refill Last Dose/Taking   albuterol  (VENTOLIN  HFA) 108 (90 Base) MCG/ACT inhaler Inhale 2 puffs into the lungs every 4 (four) hours as needed for wheezing or shortness of breath. (Patient not taking: Reported on 01/03/2024) 54 g 2    albuterol  (VENTOLIN  HFA) 108 (90 Base) MCG/ACT inhaler Inhale 2 puffs into the lungs every 6 (six) hours as needed for wheezing or shortness of breath. (Patient not taking: Reported on 01/03/2024) 54 g 2    aspirin  EC 81 MG tablet Take 2 tablets (162 mg total) by mouth at bedtime. Start taking when you are [redacted] weeks pregnant for rest of pregnancy for prevention of preeclampsia 300 tablet 2    cyanocobalamin (VITAMIN B12) 1000 MCG tablet Take 1 tablet (1,000 mcg total) by mouth daily. 90 tablet 1    ferrous sulfate  325 (65 FE) MG EC tablet Take 1 tablet (325 mg total) by mouth every other day. 45 tablet 3    folic acid (FOLVITE) 400 MCG tablet Take 400 mcg by mouth daily.        Review of Systems   All systems reviewed and negative except as stated in HPI  Last menstrual period 03/29/2023, unknown if currently breastfeeding. General appearance: alert, cooperative, and appears stated age Lungs: clear to auscultation bilaterally Heart: regular rate and rhythm Abdomen: soft, non-tender; bowel sounds normal Pelvic: normal female genitalia  Extremities: Homans sign is negative, no sign of DVT Presentation: cephalic Fetal monitoringBaseline: 125 bpm,  Variability: Good {> 6 bpm), Accelerations: Non-reactive but appropriate for gestational age, and Decelerations: Absent Uterine activity irregular  Dilation: 4 Effacement (%): 50 Station: Ballotable (7/3)  Prenatal labs: ABO, Rh: O/Positive/-- (12/23 1520) Antibody: Negative (12/23 1520) Rubella: <0.90 (12/23 1520) RPR: Non Reactive (04/16 0935)  HBsAg: Negative (12/23 1520)  HIV: Non Reactive (04/16 0935)  GBS: Positive/-- (06/12 1300)    Lab Results  Component Value Date   GBS Positive (A) 12/13/2023   GTT nrl Genetic screening  LR female, Negative 2018 / 2020 horizon Anatomy US  mild R pyelectasis  Immunization History  Administered Date(s) Administered   Tdap 02/23/2017    Prenatal Transfer Tool  Maternal Diabetes: No Genetic Screening: Normal Maternal Ultrasounds/Referrals: Fetal renal pyelectasis Fetal Ultrasounds or other Referrals:  Referred to Materal Fetal Medicine  Maternal Substance Abuse:  No Significant Maternal Medications:  None Significant Maternal Lab Results: Group B Strep positive Number of Prenatal Visits:greater than 3 verified prenatal visits Maternal Vaccinations: none Other Comments:  None   No results found for this or any previous visit (from the past 24 hours).  Patient Active Problem List   Diagnosis Date Noted   Encounter for induction of labor 01/11/2024   GBS (group B Streptococcus carrier), +RV culture, currently pregnant 12/17/2023   Recurrent vaginitis 12/05/2023   Poor fetal growth affecting management of mother in third trimester 12/05/2023   Maternal iron  deficiency anemia complicating pregnancy in third trimester 10/31/2023   Supervision of other normal pregnancy, antepartum 05/15/2023   Chronic migraine w/o aura w/o status migrainosus, not intractable 08/01/2021   History of preeclampsia in two previous pregnancies 05/10/2017   Rubella non-immune status, antepartum 01/22/2017    Assessment/Plan:  Regena Delucchi is a 28  y.o. H4E6986 at [redacted]w[redacted]d here for PDIOL  #Labor:Pitocin  until adequately treated for GBS then AROM #Pain: Per patient request #FWB: Cat I #GBS status:  Positive; PCN #Feeding: Breastmilk  #Reproductive Life planning: None  #Polyhydramnios, resolved: 5/23 AFI 25.0, LVP 7.82 > wnl > 7/3 AFI 19.02, LVP 7.4 #Poor fetal growth: @[redacted]w[redacted]d , CWD, 2743g, 14% EFW  #Anemia - f/u admission Hgb  #RNI - PP vaccine offered #Hx PreE - f/u admission PEC labs  #Hx of Chronic migraines   Mardy Shropshire, MD  01/11/2024, 4:46 AM

## 2024-01-11 ENCOUNTER — Encounter (HOSPITAL_COMMUNITY): Payer: Self-pay | Admitting: Obstetrics and Gynecology

## 2024-01-11 ENCOUNTER — Inpatient Hospital Stay (HOSPITAL_COMMUNITY)
Admission: RE | Admit: 2024-01-11 | Discharge: 2024-01-13 | DRG: 807 | Disposition: A | Discharge status: Home / Self Care | Attending: Obstetrics and Gynecology | Admitting: Obstetrics and Gynecology

## 2024-01-11 DIAGNOSIS — O403XX0 Polyhydramnios, third trimester, not applicable or unspecified: Secondary | ICD-10-CM | POA: Diagnosis present

## 2024-01-11 DIAGNOSIS — Z3A41 41 weeks gestation of pregnancy: Secondary | ICD-10-CM | POA: Diagnosis not present

## 2024-01-11 DIAGNOSIS — O365930 Maternal care for other known or suspected poor fetal growth, third trimester, not applicable or unspecified: Secondary | ICD-10-CM | POA: Diagnosis not present

## 2024-01-11 DIAGNOSIS — D573 Sickle-cell trait: Secondary | ICD-10-CM | POA: Diagnosis present

## 2024-01-11 DIAGNOSIS — O99824 Streptococcus B carrier state complicating childbirth: Secondary | ICD-10-CM | POA: Diagnosis not present

## 2024-01-11 DIAGNOSIS — O99820 Streptococcus B carrier state complicating pregnancy: Secondary | ICD-10-CM | POA: Diagnosis not present

## 2024-01-11 DIAGNOSIS — O9902 Anemia complicating childbirth: Secondary | ICD-10-CM | POA: Diagnosis not present

## 2024-01-11 DIAGNOSIS — Z3490 Encounter for supervision of normal pregnancy, unspecified, unspecified trimester: Principal | ICD-10-CM | POA: Diagnosis present

## 2024-01-11 DIAGNOSIS — O480 Post-term pregnancy: Principal | ICD-10-CM | POA: Diagnosis present

## 2024-01-11 LAB — TYPE AND SCREEN
ABO/RH(D): O POS
Antibody Screen: NEGATIVE

## 2024-01-11 LAB — COMPREHENSIVE METABOLIC PANEL WITH GFR
ALT: 15 U/L (ref 0–44)
AST: 22 U/L (ref 15–41)
Albumin: 3.4 g/dL — ABNORMAL LOW (ref 3.5–5.0)
Alkaline Phosphatase: 156 U/L — ABNORMAL HIGH (ref 38–126)
Anion gap: 10 (ref 5–15)
BUN: 7 mg/dL (ref 6–20)
CO2: 21 mmol/L — ABNORMAL LOW (ref 22–32)
Calcium: 9.5 mg/dL (ref 8.9–10.3)
Chloride: 104 mmol/L (ref 98–111)
Creatinine, Ser: 0.56 mg/dL (ref 0.44–1.00)
GFR, Estimated: >60 mL/min (ref >60)
Glucose, Bld: 74 mg/dL (ref 70–99)
Potassium: 3.6 mmol/L (ref 3.5–5.1)
Sodium: 135 mmol/L (ref 135–145)
Total Bilirubin: 0.7 mg/dL (ref 0.0–1.2)
Total Protein: 7.3 g/dL (ref 6.5–8.1)

## 2024-01-11 LAB — CBC
HCT: 39.8 % (ref 36.0–46.0)
Hemoglobin: 12.9 g/dL (ref 12.0–15.0)
MCH: 30.6 pg (ref 26.0–34.0)
MCHC: 32.4 g/dL (ref 30.0–36.0)
MCV: 94.3 fL (ref 80.0–100.0)
Platelets: 291 K/uL (ref 150–400)
RBC: 4.22 MIL/uL (ref 3.87–5.11)
RDW: 14.6 % (ref 11.5–15.5)
WBC: 14.9 K/uL — ABNORMAL HIGH (ref 4.0–10.5)
nRBC: 0 % (ref 0.0–0.2)

## 2024-01-11 LAB — RPR: RPR Ser Ql: NONREACTIVE

## 2024-01-11 MED ORDER — DIPHENHYDRAMINE HCL 25 MG PO CAPS: 25.0000 mg | ORAL_CAPSULE | Freq: Four times a day (QID) | ORAL | Status: DC | PRN

## 2024-01-11 MED ORDER — TERBUTALINE SULFATE 1 MG/ML IJ SOLN: 0.2500 mg | Freq: Once | INTRAMUSCULAR | Status: DC | PRN

## 2024-01-11 MED ORDER — OXYCODONE-ACETAMINOPHEN 5-325 MG PO TABS: 2.0000 | ORAL_TABLET | ORAL | Status: DC | PRN

## 2024-01-11 MED ORDER — OXYTOCIN BOLUS FROM INFUSION
333.0000 mL | Freq: Once | INTRAVENOUS | Status: AC
  Administered 2024-01-11: 333 mL via INTRAVENOUS

## 2024-01-11 MED ORDER — FENTANYL CITRATE (PF) 100 MCG/2ML IJ SOLN
50.0000 ug | INTRAMUSCULAR | Status: DC | PRN
  Administered 2024-01-11: 100 ug via INTRAVENOUS
  Filled 2024-01-11: qty 2

## 2024-01-11 MED ORDER — SIMETHICONE 80 MG PO CHEW: 80.0000 mg | CHEWABLE_TABLET | ORAL | Status: DC | PRN

## 2024-01-11 MED ORDER — MISOPROSTOL 25 MCG QUARTER TABLET: 25.0000 ug | ORAL_TABLET | Freq: Once | ORAL | Status: DC

## 2024-01-11 MED ORDER — EPHEDRINE 5 MG/ML INJ: 10.0000 mg | INTRAVENOUS | Status: DC | PRN

## 2024-01-11 MED ORDER — PENICILLIN G POT IN DEXTROSE 60000 UNIT/ML IV SOLN
3.0000 10*6.[IU] | INTRAVENOUS | Status: DC
  Administered 2024-01-11: 3 10*6.[IU] via INTRAVENOUS
  Filled 2024-01-11: qty 50

## 2024-01-11 MED ORDER — MISOPROSTOL 50MCG HALF TABLET
50.0000 ug | ORAL_TABLET | Freq: Once | ORAL | Status: AC
  Administered 2024-01-11: 50 ug via ORAL
  Filled 2024-01-11: qty 1

## 2024-01-11 MED ORDER — LACTATED RINGERS IV SOLN: INTRAVENOUS | Status: DC

## 2024-01-11 MED ORDER — SENNOSIDES-DOCUSATE SODIUM 8.6-50 MG PO TABS
2.0000 | ORAL_TABLET | Freq: Every day | ORAL | Status: DC
  Administered 2024-01-12 – 2024-01-13 (×2): 2 via ORAL
  Filled 2024-01-11 (×2): qty 2

## 2024-01-11 MED ORDER — COCONUT OIL OIL: 1.0000 | TOPICAL_OIL | Status: DC | PRN

## 2024-01-11 MED ORDER — PENICILLIN G POTASSIUM 5000000 UNITS IJ SOLR
5.0000 10*6.[IU] | Freq: Once | INTRAMUSCULAR | Status: AC
  Administered 2024-01-11: 5 10*6.[IU] via INTRAVENOUS
  Filled 2024-01-11: qty 5

## 2024-01-11 MED ORDER — OXYCODONE-ACETAMINOPHEN 5-325 MG PO TABS: 1.0000 | ORAL_TABLET | ORAL | Status: DC | PRN

## 2024-01-11 MED ORDER — OXYTOCIN-SODIUM CHLORIDE 30-0.9 UT/500ML-% IV SOLN: 2.5000 [IU]/h | INTRAVENOUS | Status: DC

## 2024-01-11 MED ORDER — IBUPROFEN 600 MG PO TABS
600.0000 mg | ORAL_TABLET | Freq: Four times a day (QID) | ORAL | Status: DC
  Administered 2024-01-11 – 2024-01-13 (×8): 600 mg via ORAL
  Filled 2024-01-11 (×8): qty 1

## 2024-01-11 MED ORDER — ONDANSETRON HCL 4 MG/2ML IJ SOLN: 4.0000 mg | INTRAMUSCULAR | Status: DC | PRN

## 2024-01-11 MED ORDER — DIPHENHYDRAMINE HCL 50 MG/ML IJ SOLN: 12.5000 mg | INTRAMUSCULAR | Status: DC | PRN

## 2024-01-11 MED ORDER — FENTANYL CITRATE (PF) 100 MCG/2ML IJ SOLN: 50.0000 ug | INTRAMUSCULAR | Status: DC | PRN

## 2024-01-11 MED ORDER — TETANUS-DIPHTH-ACELL PERTUSSIS 5-2.5-18.5 LF-MCG/0.5 IM SUSY: 0.5000 mL | PREFILLED_SYRINGE | Freq: Once | INTRAMUSCULAR | Status: DC

## 2024-01-11 MED ORDER — PHENYLEPHRINE 80 MCG/ML (10ML) SYRINGE FOR IV PUSH (FOR BLOOD PRESSURE SUPPORT): 80.0000 ug | PREFILLED_SYRINGE | INTRAVENOUS | Status: DC | PRN

## 2024-01-11 MED ORDER — ONDANSETRON HCL 4 MG/2ML IJ SOLN: 4.0000 mg | Freq: Four times a day (QID) | INTRAMUSCULAR | Status: DC | PRN

## 2024-01-11 MED ORDER — LACTATED RINGERS IV SOLN: 500.0000 mL | INTRAVENOUS | Status: DC | PRN

## 2024-01-11 MED ORDER — PRENATAL MULTIVITAMIN CH
1.0000 | ORAL_TABLET | Freq: Every day | ORAL | Status: DC
  Administered 2024-01-12: 1 via ORAL
  Filled 2024-01-11 (×2): qty 1

## 2024-01-11 MED ORDER — LIDOCAINE HCL (PF) 1 % IJ SOLN: 30.0000 mL | INTRAMUSCULAR | Status: DC | PRN

## 2024-01-11 MED ORDER — KETOROLAC TROMETHAMINE 30 MG/ML IJ SOLN
30.0000 mg | Freq: Once | INTRAMUSCULAR | Status: AC
  Administered 2024-01-11: 30 mg via INTRAVENOUS
  Filled 2024-01-11: qty 1

## 2024-01-11 MED ORDER — WITCH HAZEL-GLYCERIN EX PADS: 1.0000 | MEDICATED_PAD | CUTANEOUS | Status: DC | PRN

## 2024-01-11 MED ORDER — ACETAMINOPHEN 325 MG PO TABS
650.0000 mg | ORAL_TABLET | ORAL | Status: DC | PRN
  Administered 2024-01-11 – 2024-01-12 (×5): 650 mg via ORAL
  Filled 2024-01-11 (×6): qty 2

## 2024-01-11 MED ORDER — MISOPROSTOL 25 MCG QUARTER TABLET
25.0000 ug | ORAL_TABLET | Freq: Once | ORAL | Status: AC
Start: 2024-01-11 — End: 2024-01-11
  Administered 2024-01-11: 25 ug via VAGINAL
  Filled 2024-01-11: qty 1

## 2024-01-11 MED ORDER — ZOLPIDEM TARTRATE 5 MG PO TABS: 5.0000 mg | ORAL_TABLET | Freq: Every evening | ORAL | Status: DC | PRN

## 2024-01-11 MED ORDER — LACTATED RINGERS IV SOLN: 500.0000 mL | Freq: Once | INTRAVENOUS | Status: DC

## 2024-01-11 MED ORDER — DIBUCAINE (PERIANAL) 1 % EX OINT: 1.0000 | TOPICAL_OINTMENT | CUTANEOUS | Status: DC | PRN

## 2024-01-11 MED ORDER — ACETAMINOPHEN 325 MG PO TABS: 650.0000 mg | ORAL_TABLET | ORAL | Status: DC | PRN

## 2024-01-11 MED ORDER — OXYTOCIN-SODIUM CHLORIDE 30-0.9 UT/500ML-% IV SOLN
1.0000 m[IU]/min | INTRAVENOUS | Status: DC
  Filled 2024-01-11: qty 500

## 2024-01-11 MED ORDER — SOD CITRATE-CITRIC ACID 500-334 MG/5ML PO SOLN
30.0000 mL | ORAL | Status: DC | PRN
Start: 2024-01-11 — End: 2024-01-11
  Administered 2024-01-11: 30 mL via ORAL
  Filled 2024-01-11: qty 30

## 2024-01-11 MED ORDER — ONDANSETRON HCL 4 MG PO TABS: 4.0000 mg | ORAL_TABLET | ORAL | Status: DC | PRN

## 2024-01-11 MED ORDER — FENTANYL-BUPIVACAINE-NACL 0.5-0.125-0.9 MG/250ML-% EP SOLN
12.0000 mL/h | EPIDURAL | Status: DC | PRN
  Filled 2024-01-11: qty 250

## 2024-01-11 MED ORDER — OXYTOCIN-SODIUM CHLORIDE 30-0.9 UT/500ML-% IV SOLN
1.0000 m[IU]/min | INTRAVENOUS | Status: DC
  Administered 2024-01-11: 1 m[IU]/min via INTRAVENOUS

## 2024-01-11 MED ORDER — BENZOCAINE-MENTHOL 20-0.5 % EX AERO
1.0000 | INHALATION_SPRAY | CUTANEOUS | Status: DC | PRN
  Administered 2024-01-11: 1 via TOPICAL
  Filled 2024-01-11: qty 56

## 2024-01-11 NOTE — Progress Notes (Signed)
 Patient ID: Nicole Mcmahon, female   DOB: [birth year], 28 y.o.   MRN: [id number]  Vitals:   01/11/24 1043 01/11/24 1249  BP: (!) 112/49 129/78  Pulse: 83 85  Resp: 14 16  Temp:  98.8 F (37.1 C)   Dilation: 5.5 Effacement (%): 50 Cervical Position: Posterior (to maternal left) Station: Ballotable Presentation: Vertex Exam by:: Vernell, SNM  Resting comfortably in bed between contractions. Feeling moderate discomfort with contractions. Discussed past labor and birth experiences. Patient expresses concern that pain will not be well managed, especially if Pitocin  is needed. Options reviewed for pain management including epidural, IV pain medicine, and nitrous. Discussed AROM, patient agreeable. During SVE fetal head found to be ballotable and high. Option given for Jamilla CNM to assess for AROM or to start low dose of Pitocin . Patient agreeable to beginning Pitocin  1x1 and plans to be out of bed in more upright positions.  FHR Cat 1 Baseline: 135 Variability: mod Accelerations: 15x15 Decelerations: none Toco: UC q 2-5 min  [name], [facility] 01/11/24 1421

## 2024-01-11 NOTE — Discharge Summary (Incomplete)
 Postpartum Discharge Summary  Date of Service updated***     Patient Name: Nicole Mcmahon DOB: 01-26-1996 MRN: [id number]  Date of admission: 01/11/2024 Delivery date:01/11/2024 Delivering provider: HONORE MILLMAN E Date of discharge: 01/11/2024  Admitting diagnosis: Encounter for induction of labor [Z34.90] Intrauterine pregnancy: [redacted]w[redacted]d     Secondary diagnosis:  Principal Problem:   Encounter for induction of labor  Additional problems: ***    Discharge diagnosis: {DX.:23714}                                              Post partum procedures:{Postpartum procedures:23558} Augmentation: {Augmentation:20782} Complications: {OB Labor/Delivery Complications:20784}  Hospital course: {Courses:23701}  Magnesium  Sulfate received: {Mag received:30440022} BMZ received: {BMZ received:30440023} Rhophylac:{Rhophylac received:30440032} FFM:{FFM:69559966} T-DaP:{Tdap:23962} Flu: {Qol:76036} RSV Vaccine received: {RSV:31013} Transfusion:{Transfusion received:30440034}  Immunizations received: Immunization History  Administered Date(s) Administered   Tdap 02/23/2017    Physical exam  Vitals:   01/11/24 1043 01/11/24 1249 01/11/24 1507 01/11/24 1516  BP: (!) 112/49 129/78 (!) 113/58 116/63  Pulse: 83 85 76 74  Resp: 14 16 12    Temp:  98.8 F (37.1 C) 98 F (36.7 C)   TempSrc:  Oral Oral   Weight:      Height:       General: {Exam; general:21111117} Lochia: {Desc; appropriate/inappropriate:30686::appropriate} Uterine Fundus: {Desc; firm/soft:30687} Incision: {Exam; incision:21111123} DVT Evaluation: {Exam; dvt:2111122} Labs: Lab Results  Component Value Date   WBC 14.9 (H) 01/11/2024   HGB 12.9 01/11/2024   HCT 39.8 01/11/2024   MCV 94.3 01/11/2024   PLT 291 01/11/2024      Latest Ref Rng & Units 01/11/2024    4:32 AM  CMP  Glucose 70 - 99 mg/dL 74   BUN 6 - 20 mg/dL 7   Creatinine 9.55 - 8.99 mg/dL 9.43   Sodium 864 - 854 mmol/L 135   Potassium 3.5 -  5.1 mmol/L 3.6   Chloride 98 - 111 mmol/L 104   CO2 22 - 32 mmol/L 21   Calcium  8.9 - 10.3 mg/dL 9.5   Total Protein 6.5 - 8.1 g/dL 7.3   Total Bilirubin 0.0 - 1.2 mg/dL 0.7   Alkaline Phos 38 - 126 U/L 156   AST 15 - 41 U/L 22   ALT 0 - 44 U/L 15    Edinburgh Score:    09/26/2021    1:30 PM  Edinburgh Postnatal Depression Scale Screening Tool  I have been able to laugh and see the funny side of things. 0  I have looked forward with enjoyment to things. 0  I have blamed myself unnecessarily when things went wrong. 0  I have been anxious or worried for no good reason. 3  I have felt scared or panicky for no good reason. 0  Things have been getting on top of me. 2  I have been so unhappy that I have had difficulty sleeping. 2  I have felt sad or miserable. 2  I have been so unhappy that I have been crying. 0  The thought of harming myself has occurred to me. 0  Edinburgh Postnatal Depression Scale Total 9      Data saved with a previous flowsheet row definition   No data recorded  After visit meds:  Allergies as of 01/11/2024       Reactions   Pork-derived Products  Med Rec must be completed prior to using this [facility]***        Discharge home in stable condition Infant Feeding: {Baby feeding:23562} Infant Disposition:{CHL IP OB HOME WITH FNUYZM:76418} Discharge instruction: per After Visit Summary and Postpartum booklet. Activity: Advance as tolerated. Pelvic rest for 6 weeks.  Diet: {OB ipzu:78888878} Future Appointments:No future appointments. Follow up Visit:   Please schedule this patient for a {Visit type:23955} postpartum visit in {Postpartum visit:23953} with the following provider: {Provider type:23954}. Additional Postpartum F/U:{PP Procedure:23957}  {Risk level:23960} pregnancy complicated by: {complication:23959} Delivery mode:  Vaginal, Spontaneous Anticipated Birth Control:  {Birth Control:23956}   01/11/2024 Cornell JONELLE Finder, CNM

## 2024-01-11 NOTE — Progress Notes (Signed)
 Patient ID: Nicole Mcmahon, female   DOB: May 01, 1996, 28 y.o.   MRN: [id number]  Vitals:   01/11/24 0703 01/11/24 1043  BP: 128/73 (!) 112/49  Pulse: 74 83  Resp: 14 14  Temp: 98.8 F (37.1 C)    Resting comfortably in bed. Last dose of Cytotec  given 0819. Patient strongly desires to avoid Pitocin . Open to AROM. Plan to wait for second dose of penicillin  prior to assessing for possibility of AROM. Planning epidural for pain relief.  RN reports prolonged decel lasting 3 min around 7 am while up to bathroom. Review of tracing shows return to baseline with moderate variability. Tracing Cat 1 at this time. Baseline: 125 Variability: moderate Accelerations: 15x15 Decelerations: none Toco: UI with irregular contractions  [name], [facility] 01/11/24 1127

## 2024-01-12 ENCOUNTER — Other Ambulatory Visit (HOSPITAL_COMMUNITY): Payer: Self-pay

## 2024-01-12 MED ORDER — OXYCODONE HCL 5 MG PO TABS
5.0000 mg | ORAL_TABLET | Freq: Four times a day (QID) | ORAL | Status: DC | PRN
  Administered 2024-01-12 – 2024-01-13 (×2): 5 mg via ORAL
  Filled 2024-01-12 (×2): qty 1

## 2024-01-12 MED ORDER — IBUPROFEN 600 MG PO TABS
600.0000 mg | ORAL_TABLET | Freq: Four times a day (QID) | ORAL | 1 refills | Status: AC
  Filled 2024-01-12: qty 30, 8d supply, fill #0

## 2024-01-12 MED ORDER — OXYCODONE HCL 5 MG PO TABS
5.0000 mg | ORAL_TABLET | Freq: Four times a day (QID) | ORAL | 0 refills | Status: AC | PRN
  Filled 2024-01-12: qty 5, 2d supply, fill #0

## 2024-01-12 NOTE — Progress Notes (Signed)
 OB/GYN Faculty Attending Note  Post Partum Day 1  Subjective: Patient is feeling cramping in her abdomen with breast feeding and in between as well, reports it is very painful. She reports moderately well controlled pain on PO pain meds except for the cramping. She is ambulating and denies light-headedness or dizziness. She is  passing flatus. She is tolerating a regular diet without nausea/vomiting. Bleeding is moderate. She is breast feeding. Baby is in room and doing well.  Objective: Blood pressure 123/71, pulse 73, temperature 99 F (37.2 C), temperature source Oral, resp. rate 18, height 5' 1 (1.549 m), weight 79.3 kg, last menstrual period 03/29/2023, SpO2 100%, unknown if currently breastfeeding. Temp:  [98 F (36.7 C)-99 F (37.2 C)] 99 F (37.2 C) (07/12 0507) Pulse Rate:  [71-91] 73 (07/12 0507) Resp:  [12-18] 18 (07/12 0507) BP: (113-140)/(58-84) 123/71 (07/12 0507) SpO2:  [99 %-100 %] 100 % (07/12 0117)  Physical Exam:  General: alert, oriented, cooperative Chest: normal respiratory effort Heart: regular rate  Abdomen: soft, appropriately tender to palpation  Uterine Fundus: firm, 2 fingers below the umbilicus Lochia: minimal, rubra DVT Evaluation: no evidence of DVT Extremities: no edema, no calf tenderness  UOP: voiding spontaneously  Recent Labs    01/11/24 0432  HGB 12.9  HCT 39.8    Assessment/Plan: Patient Active Problem List   Diagnosis Date Noted   Encounter for induction of labor 01/11/2024   GBS (group B Streptococcus carrier), +RV culture, currently pregnant 12/17/2023   Recurrent vaginitis 12/05/2023   Poor fetal growth affecting management of mother in third trimester 12/05/2023   Maternal iron  deficiency anemia complicating pregnancy in third trimester 10/31/2023   Supervision of other normal pregnancy, antepartum 05/15/2023   Chronic migraine w/o aura w/o status migrainosus, not intractable 08/01/2021   History of preeclampsia in two  previous pregnancies 05/10/2017   Rubella non-immune status, antepartum 01/22/2017    Patient is 28 y.o. H4E5985 PPD#1 s/p SVD at [redacted]w[redacted]d. Course complicated by IOL for post dates, rubella non-immune. She is doing well, recovering appropriately and complains only of cramping.   Continue routine post partum care Pain meds prn, will add oxycodone  as she is having bad cramping Regular diet nothing for birth control Plan for discharge tomorrow   K. Nicole Moats, MD, [facility] Attending Center for [employer] (Faculty Practice)  01/12/2024, 11:01 AM

## 2024-01-13 LAB — COMPREHENSIVE METABOLIC PANEL WITH GFR
ALT: 18 U/L (ref 0–44)
AST: 23 U/L (ref 15–41)
Albumin: 3 g/dL — ABNORMAL LOW (ref 3.5–5.0)
Alkaline Phosphatase: 116 U/L (ref 38–126)
Anion gap: 11 (ref 5–15)
BUN: 8 mg/dL (ref 6–20)
CO2: 20 mmol/L — ABNORMAL LOW (ref 22–32)
Calcium: 9 mg/dL (ref 8.9–10.3)
Chloride: 105 mmol/L (ref 98–111)
Creatinine, Ser: 0.6 mg/dL (ref 0.44–1.00)
GFR, Estimated: >60 mL/min (ref >60)
Glucose, Bld: 94 mg/dL (ref 70–99)
Potassium: 3.8 mmol/L (ref 3.5–5.1)
Sodium: 136 mmol/L (ref 135–145)
Total Bilirubin: 0.6 mg/dL (ref 0.0–1.2)
Total Protein: 6.4 g/dL — ABNORMAL LOW (ref 6.5–8.1)

## 2024-01-13 LAB — CBC
HCT: 35.8 % — ABNORMAL LOW (ref 36.0–46.0)
Hemoglobin: 11.4 g/dL — ABNORMAL LOW (ref 12.0–15.0)
MCH: 30.1 pg (ref 26.0–34.0)
MCHC: 31.8 g/dL (ref 30.0–36.0)
MCV: 94.5 fL (ref 80.0–100.0)
Platelets: 279 K/uL (ref 150–400)
RBC: 3.79 MIL/uL — ABNORMAL LOW (ref 3.87–5.11)
RDW: 14.6 % (ref 11.5–15.5)
WBC: 12.2 K/uL — ABNORMAL HIGH (ref 4.0–10.5)
nRBC: 0 % (ref 0.0–0.2)

## 2024-01-13 NOTE — Discharge Summary (Signed)
 Postpartum Discharge Summary     Patient Name: Nicole Mcmahon DOB: 1996-01-18 MRN: [id number]  Date of admission: 01/11/2024 Delivery date:01/11/2024 Delivering provider: HONORE MILLMAN E Date of discharge: 01/13/2024  Admitting diagnosis: Encounter for induction of labor [Z34.90] Intrauterine pregnancy: [redacted]w[redacted]d     Secondary diagnosis:  Principal Problem:   Encounter for induction of labor  Additional problems: none    Discharge diagnosis: Term Pregnancy Delivered                                              Post partum procedures:none Augmentation: Pitocin  and Cytotec  Complications: None  Hospital course: Induction of Labor With Vaginal Delivery   28 y.o. yo H4E5985 at [redacted]w[redacted]d was admitted to the hospital 01/11/2024 for induction of labor.  Indication for induction: Postdates.  Patient had an labor course complicated by nothing . Precip labor.  Membrane Rupture Time/Date: 2:29 PM,01/11/2024  Delivery Method:Vaginal, Spontaneous Operative Delivery:N/A Episiotomy: None Lacerations:  None Details of delivery can be found in separate delivery note.  Patient had a postpartum course complicated by brief HA. Nml Pre-E labs.  Did not have epidural. Patient is discharged home 01/13/24.  Newborn Data: Birth date:01/11/2024 Birth time:2:32 PM Gender:Female Living status:Living Apgars:7 ,9  Weight:3200 g  Magnesium  Sulfate received: No BMZ received: No Rhophylac:N/A MMR: Declined T-DaP:declined Flu: declined RSV Vaccine received: No Transfusion:No  Immunizations received: Immunization History  Administered Date(s) Administered   Tdap 02/23/2017    Physical exam  Vitals:   01/12/24 0507 01/12/24 1300 01/12/24 2051 01/13/24 0533  BP: 123/71 121/74 126/83 110/62  Pulse: 73 75 72 83  Resp: 18 18 18 16   Temp: 99 F (37.2 C) 98 F (36.7 C) 98.3 F (36.8 C) 98.3 F (36.8 C)  TempSrc: Oral Oral Oral Oral  SpO2:  100% 98% 100%  Weight:      Height:       General:  alert, cooperative, and no distress Lochia: appropriate Uterine Fundus: firm Incision: N/A DVT Evaluation: No evidence of DVT seen on physical exam. No LE edema.  Labs: Lab Results  Component Value Date   WBC 12.2 (H) 01/13/2024   HGB 11.4 (L) 01/13/2024   HCT 35.8 (L) 01/13/2024   MCV 94.5 01/13/2024   PLT 279 01/13/2024      Latest Ref Rng & Units 01/11/2024    4:32 AM  CMP  Glucose 70 - 99 mg/dL 74   BUN 6 - 20 mg/dL 7   Creatinine 9.55 - 8.99 mg/dL 9.43   Sodium 864 - 854 mmol/L 135   Potassium 3.5 - 5.1 mmol/L 3.6   Chloride 98 - 111 mmol/L 104   CO2 22 - 32 mmol/L 21   Calcium  8.9 - 10.3 mg/dL 9.5   Total Protein 6.5 - 8.1 g/dL 7.3   Total Bilirubin 0.0 - 1.2 mg/dL 0.7   Alkaline Phos 38 - 126 U/L 156   AST 15 - 41 U/L 22   ALT 0 - 44 U/L 15    Edinburgh Score:    01/12/2024    5:07 AM  Edinburgh Postnatal Depression Scale Screening Tool  I have been able to laugh and see the funny side of things. --   No data recorded  After visit meds:  Allergies as of 01/13/2024       Reactions   Pork-derived Products  Medication List     STOP taking these medications    albuterol  108 (90 Base) MCG/ACT inhaler Commonly known as: VENTOLIN  HFA   aspirin  EC 81 MG tablet   cyanocobalamin 1000 MCG tablet Commonly known as: VITAMIN B12   ferrous sulfate  325 (65 FE) MG EC tablet   folic acid 400 MCG tablet Commonly known as: FOLVITE   Ventolin  HFA 108 (90 Base) MCG/ACT inhaler Generic drug: albuterol        TAKE these medications    ibuprofen  600 MG tablet Commonly known as: ADVIL  Take 1 tablet (600 mg total) by mouth every 6 (six) hours.   oxyCODONE  5 MG immediate release tablet Commonly known as: Oxy IR/ROXICODONE  Take 1 tablet (5 mg total) by mouth every 6 (six) hours as needed for severe pain (pain score 7-10).         Discharge home in stable condition Infant Feeding: Breast Infant Disposition:home with mother Discharge instruction:  per After Visit Summary and Postpartum booklet. Activity: Advance as tolerated. Pelvic rest for 6 weeks.  Diet: routine diet Future Appointments:No future appointments. Follow up Visit:  Follow-up Information     [facility] for [facility] Follow up.   Specialty: Obstetrics and Gynecology Why: For postpartum visit Contact information: [address] [place]  [phone]        [facility] Follow up.   Specialty: Obstetrics and Gynecology Why: As needed in emergencies Contact information: [address] [place]  [phone]                 Please schedule this patient for a In person postpartum visit in 4 weeks with the following provider: Any provider. Additional Postpartum F/U:none  Low risk pregnancy complicated by: NA Delivery mode:  Vaginal, Spontaneous Anticipated Birth Control:  Unsure   01/13/2024 Nicole Mcmahon  Claudene, CNM

## 2024-01-23 ENCOUNTER — Telehealth (HOSPITAL_COMMUNITY): Payer: Self-pay | Admitting: *Deleted

## 2024-01-23 NOTE — Telephone Encounter (Signed)
 01/23/2024  Name: Nicole Mcmahon MRN: [id number] DOB: 04-18-1996  Reason for Call:  Transition of Care Hospital Discharge Call  Contact Status: Patient Contact Status: Complete  Language assistant needed: Interpreter Mode: Interpreter Not Needed        Follow-Up Questions: Do You Have Any Concerns About Your Health As You Heal From Delivery?: No Do You Have Any Concerns About Your Infants Health?: No  Edinburgh Postnatal Depression Scale:  In the Past 7 Days: I have been able to laugh and see the funny side of things.: As much as I always could I have looked forward with enjoyment to things.: As much as I ever did I have blamed myself unnecessarily when things went wrong.: Not very often I have been anxious or worried for no good reason.: No, not at all I have felt scared or panicky for no good reason.: No, not much Things have been getting on top of me.: No, I have been coping as well as ever I have been so unhappy that I have had difficulty sleeping.: Not at all I have felt sad or miserable.: Not very often I have been so unhappy that I have been crying.: Only occasionally The thought of harming myself has occurred to me.: Never Edinburgh Postnatal Depression Scale Total: 4  PHQ2-9 Depression Scale:     Discharge Follow-up: Edinburgh score requires follow up?: No Patient was advised of the following resources:: Support Group, Breastfeeding Support Group (declines postpartum group information via email)  Post-discharge interventions: Reviewed Newborn Safe Sleep Practices  Mliss Sieve, RN 01/23/2024 15:58

## 2024-02-04 ENCOUNTER — Other Ambulatory Visit: Payer: Self-pay | Admitting: *Deleted

## 2024-02-04 MED ORDER — SLYND 4 MG PO TABS: 4.0000 mg | ORAL_TABLET | Freq: Every day | ORAL | 10 refills | Status: AC

## 2024-02-06 ENCOUNTER — Other Ambulatory Visit: Payer: Self-pay | Admitting: *Deleted

## 2024-02-06 MED ORDER — DOCUSATE SODIUM 100 MG PO CAPS
100.0000 mg | ORAL_CAPSULE | Freq: Two times a day (BID) | ORAL | 2 refills | Status: AC | PRN
Start: 2024-02-06 — End: ?

## 2024-02-25 ENCOUNTER — Ambulatory Visit: Admitting: Obstetrics & Gynecology

## 2024-02-26 ENCOUNTER — Encounter: Payer: Self-pay | Admitting: Family Medicine

## 2024-02-26 ENCOUNTER — Telehealth: Admitting: Family Medicine

## 2024-02-26 NOTE — Progress Notes (Signed)
 Provider location: Center for [facility]   Patient location: Home  I connected with [name] on 02/26/24 at  1:10 PM EDT by Mychart Video Encounter and verified that I am speaking with the correct person using two identifiers.       I discussed the limitations, risks, security and privacy concerns of performing an evaluation and management service virtually and the availability of in person appointments. I also discussed with the patient that there may be a patient responsible charge related to this service. The patient expressed understanding and agreed to proceed.  Post Partum Visit Note Subjective:   Nicole Mcmahon is a 28 y.o. [id number] female who presents for a postpartum visit. She is 6 weeks postpartum following a normal spontaneous vaginal delivery.  I have fully reviewed the prenatal and intrapartum course. The delivery was at 41.1 gestational weeks.  Anesthesia: none. Postpartum course has been well. Baby is doing well. Baby is feeding by breast. Bleeding had stopped after 6 weeks had intercourse and bleeding started. Bowel function is normal. Bladder function is normal. Patient is sexually active. Contraception method is oral progesterone-only contraceptive. Postpartum depression screening: positive. Having regular headaches. Pills are not helping. ? Eye issues.  The pregnancy intention screening data noted above was reviewed. Potential methods of contraception were discussed. The patient elected to proceed with No data recorded.   Edinburgh Postnatal Depression Scale - 02/26/24 1304       Edinburgh Postnatal Depression Scale:  In the Past 7 Days   I have been able to laugh and see the funny side of things. 0    I have looked forward with enjoyment to things. 0    I have blamed myself unnecessarily when things went wrong. 0    I have been anxious or worried for no good reason. 3    I have felt scared or panicky for no good reason. 0    Things have  been getting on top of me. 0    I have been so unhappy that I have had difficulty sleeping. 0    I have felt sad or miserable. 3    I have been so unhappy that I have been crying. 3    The thought of harming myself has occurred to me. 0    Edinburgh Postnatal Depression Scale Total 9          The following portions of the patient's history were reviewed and updated as appropriate: allergies, current medications, past family history, past medical history, past social history, past surgical history, and problem list.  Review of Systems Pertinent items are noted in HPI.  Objective:  LMP 03/29/2023 (Exact Date)     General:  Alert, oriented and cooperative. Patient is in no acute distress.  Respiratory: Normal respiratory effort, no problems with respiration noted  Mental Status: Normal mood and affect. Normal behavior. Normal judgment and thought content.  Rest of physical exam deferred due to type of encounter   Assessment:    Normal postpartum exam.  Plan:  Essential components of care per ACOG recommendations:  1.  Mood and well being: Patient with equivocal depression screening today. Mostly reports anxiety. Declines any meds at this time. Reviewed local resources for support.  - Patient does not use tobacco.  - hx of drug use? No    2. Infant care and feeding:  -Patient currently breastmilk feeding? Yes  Reviewed importance of draining breast regularly to support lactation. -Social determinants of health (  SDOH) reviewed in EPIC. No concerns  3. Sexuality, contraception and birth spacing - Patient does not want a pregnancy in the next year.  Desired family size is unsure children.  - Reviewed forms of contraception in tiered fashion. Patient desired oral progesterone-only contraceptive today.   - Discussed birth spacing of 18 months - Ibuprofen  for both bleeding and pain control  4. Sleep and fatigue -Encouraged family/partner/community support of 4 hrs of uninterrupted  sleep to help with mood and fatigue  5. Physical Recovery  - Discussed patients delivery SVD and no complications - Patient had a 1st degree laceration, perineal healing reviewed. Patient expressed understanding - Patient has urinary incontinence?  - Patient is safe to resume physical and sexual activity  6.  Health Maintenance - Last pap smear done 2022 and was normal with negative HPV. Needs pap and scheduled on 03/14/2024   7. Chronic Disease Headache - f/u with ophthalmology - PCP follow up  I provided on 9 minutes of face-to-face time during this encounter.    No follow-ups on file.  Future Appointments  Date Time Provider Department Center  03/14/2024 11:15 AM Eldonna Suzen Octave, MD [facility]    Glenys GORMAN Birk, MD [facility] for [facility], [facility]

## 2024-02-28 ENCOUNTER — Other Ambulatory Visit (HOSPITAL_COMMUNITY)
Admission: RE | Admit: 2024-02-28 | Discharge: 2024-02-28 | Disposition: A | Discharge status: Home / Self Care | Source: Ambulatory Visit | Attending: Obstetrics & Gynecology | Admitting: Obstetrics & Gynecology

## 2024-02-28 ENCOUNTER — Ambulatory Visit: Admitting: Obstetrics & Gynecology

## 2024-02-28 ENCOUNTER — Encounter: Payer: Self-pay | Admitting: Obstetrics & Gynecology

## 2024-02-28 VITALS — BP 116/77 | HR 83 | Wt 151.0 lb

## 2024-02-28 DIAGNOSIS — Z124 Encounter for screening for malignant neoplasm of cervix: Secondary | ICD-10-CM

## 2024-02-28 NOTE — Progress Notes (Signed)
   GYNECOLOGY OFFICE VISIT NOTE  History:  Nicole Mcmahon is a 28 y.o. [id number] here today for pap smear.  Is on period now, but has light bleeding. She denies any abnormal vaginal discharge,  pelvic pain or other concerns. Had a virtual postpartum visit on 02/26/24.   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    History of pre-eclampsia in prior pregnancy, currently pregnant    Iron  deficiency anemia 10/31/2023   Preeclampsia 05/10/2017   Postpartum   Sickle cell trait (HCC)     Past Surgical History:  Procedure Laterality Date   NO PAST SURGERIES      The following portions of the patient's history were reviewed and updated as appropriate: allergies, current medications, past family history, past medical history, past social history, past surgical history and problem list.   Health Maintenance:  ASCUS pap on 01/18/2021, no HPV test done.   Review of Systems:  Pertinent items noted in HPI and remainder of comprehensive ROS otherwise negative.  Physical Exam:  BP 116/77   Pulse 83   Wt 151 lb (68.5 kg)   Breastfeeding Yes   BMI 28.53 kg/m  CONSTITUTIONAL: Well-developed, well-nourished female in no acute distress.  SKIN: No rash noted. Not diaphoretic. No erythema. No pallor. MUSCULOSKELETAL: Normal range of motion. No edema noted. NEUROLOGIC: Alert and oriented to person, place, and time. Normal muscle tone coordination. No cranial nerve deficit noted on observation. PSYCHIATRIC: Normal mood and affect. Normal behavior. Normal judgment and thought content. CARDIOVASCULAR:  Normal heart rate noted RESPIRATORY: Effort and breath sounds normal, no problems with respiration noted ABDOMEN: No masses or other overt distention noted on observation. No tenderness.   PELVIC: Normal appearing external genitalia; normal urethral meatus; normal appearing vaginal mucosa and cervix.  Bloody discharge noted, scant ongoing bleeding.  Blood wiped off with fox swabs, pap smear sample obtained.  Patient was very uncomfortable with speculum placement.  Performed in the presence of a chaperone.   Assessment and Plan:     1. Pap smear for cervical cancer screening (Primary) - Cytology - PAP done, will follow up results and manage accordingly.  Routine preventative health maintenance measures emphasized. Please refer to After Visit Summary for other counseling recommendations.   Return for any gynecologic concerns.    I spent 25 minutes dedicated to the care of this patient including pre-visit review of records, face to face time with the patient discussing her conditions and treatments, post visit ordering of medications and appropriate tests or procedures, coordinating care and documenting this visit encounter.    GLORIS HUGGER, MD, FACOG Obstetrician & Gynecologist, [facility] for [employer], [facility]

## 2024-03-05 ENCOUNTER — Ambulatory Visit: Payer: Self-pay | Admitting: Obstetrics & Gynecology

## 2024-03-05 LAB — CYTOLOGY - PAP
Comment: NEGATIVE
Diagnosis: NEGATIVE
Diagnosis: REACTIVE
High risk HPV: NEGATIVE

## 2024-03-14 ENCOUNTER — Ambulatory Visit: Admitting: Family Medicine

## 2024-03-14 ENCOUNTER — Telehealth: Payer: Self-pay

## 2024-03-14 DIAGNOSIS — O480 Post-term pregnancy: Secondary | ICD-10-CM

## 2024-03-14 NOTE — Progress Notes (Signed)
 Complex Care Management Note Care Guide Note  03/14/2024 Name: Nicole Mcmahon MRN: [id number] DOB: 11/09/1995   Complex Care Management Outreach Attempts: An unsuccessful telephone outreach was attempted today to offer the patient information about available complex care management services.  Follow Up Plan:  Additional outreach attempts will be made to offer the patient complex care management information and services.   Encounter Outcome:  No Answer-Left voicemail  Leotis Rase [facility], [facility] Guide  Direct Dial : [phone]  Fax [phone]

## 2024-03-21 NOTE — Progress Notes (Signed)
 Complex Care Management Note  Care Guide Note 03/21/2024 Name: Terricka Onofrio MRN: [id number] DOB: 08-25-1995  Selene Peltzer is a 28 y.o. year old female who sees Alvia Corean CROME, FNP for primary care. I reached out to [name] by phone today to offer complex care management services.  Ms. Kracke was given information about Complex Care Management services today including:   The Complex Care Management services include support from the care team which includes your Nurse Care Manager, Clinical Social Worker, or Pharmacist.  The Complex Care Management team is here to help remove barriers to the health concerns and goals most important to you. Complex Care Management services are voluntary, and the patient may decline or stop services at any time by request to their care team member.   Complex Care Management Consent Status: Patient agreed to services and verbal consent obtained.   Follow up plan:  Telephone appointment with complex care management team member scheduled for:  04/02/24 @ 3pm  Encounter Outcome:  Patient Scheduled  Leotis Rase [facility], [facility] Guide  Direct Dial : [phone]  Fax [phone]

## 2024-04-02 ENCOUNTER — Other Ambulatory Visit: Payer: Self-pay | Admitting: *Deleted

## 2024-04-02 NOTE — Patient Instructions (Signed)
 [name] - I am you where unable to completed outreach today.  I have rescheduled your appointment for 04/28/24 @ 12 noon as requested.  I work with Alvia Corean CROME, FNP and am calling to support your healthcare needs. Please contact me at [phone] at your earliest convenience. I look forward to speaking with you soon.   Thank you,  Jalessa Peyser, RN, BSN, ACM RN Care Manager [employer] [phone]

## 2024-04-28 ENCOUNTER — Telehealth: Payer: Self-pay | Admitting: *Deleted

## 2024-05-22 ENCOUNTER — Telehealth: Admitting: *Deleted

## 2024-06-05 ENCOUNTER — Other Ambulatory Visit: Payer: Self-pay | Admitting: *Deleted

## 2024-06-05 NOTE — Patient Instructions (Signed)
 [name] - I am sorry I was unable to reach you today for our scheduled appointment. I work with Alvia Corean CROME, FNP and am calling to support your healthcare needs. Please contact me at [phone] at your earliest convenience. I look forward to speaking with you soon.   Thank you,  Marijane Trower, RN, BSN, ACM RN Care Manager [employer] [phone]

## 2024-06-12 ENCOUNTER — Telehealth: Payer: Self-pay

## 2024-06-12 NOTE — Progress Notes (Unsigned)
 Complex Care Management Care Guide Note  06/12/2024 Name: Nicole Mcmahon MRN: [id number] DOB: Nov 28, 1995  Nicole Mcmahon is a 28 y.o. year old female who is a primary care patient of Alvia Corean CROME, FNP and is actively engaged with the care management team. I reached out to [name] by phone today to assist with re-scheduling  with the RN Case Manager.  Follow up plan: Unsuccessful telephone outreach attempt made. A HIPAA compliant phone message was left for the patient providing contact information and requesting a return call.  Leotis Rase [facility], [facility] Guide  Direct Dial : [phone]  Fax [phone]

## 2024-06-13 NOTE — Progress Notes (Signed)
 Complex Care Management Care Guide Note  06/13/2024 Name: Nicole Mcmahon MRN: [id number] DOB: 24-Sep-1995  Nicole Mcmahon is a 28 y.o. year old female who is a primary care patient of Alvia Corean CROME, FNP and is actively engaged with the care management team. I reached out to [name] by phone today to assist with re-scheduling  with the RN Case Manager.  Follow up plan: Unsuccessful telephone outreach attempt made. A HIPAA compliant phone message was left for the patient providing contact information and requesting a return call.  Leotis Rase [facility], [facility] Guide  Direct Dial : [phone]  Fax [phone]

## 2024-06-23 ENCOUNTER — Telehealth: Payer: Self-pay

## 2024-06-23 NOTE — Progress Notes (Signed)
 Complex Care Management Care Guide Note  06/23/2024 Name: Nicole Mcmahon MRN: [id number] DOB: [birth year]  Nicole Mcmahon is a 28 y.o. year old female who is a primary care patient of Alvia Corean CROME, FNP and is actively engaged with the care management team. I reached out to Nicole Mcmahon by phone today to assist with re-scheduling  with the RN Case Manager.  Follow up plan: Unsuccessful telephone outreach attempt made. A HIPAA compliant phone message was left for the patient providing contact information and requesting a return call.  Nicole Mcmahon [facility], [facility] Guide  Direct Dial : [phone]  Fax [phone]

## 2024-06-25 NOTE — Progress Notes (Signed)
 Complex Care Management Care Guide Note  06/25/2024 Name: Maie Kesinger MRN: [id number] DOB: Aug 11, 1995  Haiden Clucas is a 28 y.o. year old female who is a primary care patient of Alvia Corean CROME, FNP and is actively engaged with the care management team. I reached out to [name] by phone today to assist with re-scheduling  with the RN Case Manager.  Follow up plan: Unsuccessful telephone outreach attempt made. A HIPAA compliant phone message was left for the patient providing contact information and requesting a return call.  Leotis Rase [facility], [facility] Guide  Direct Dial : [phone]  Fax [phone]
